# Patient Record
Sex: Male | Born: 1935 | Race: White | Hispanic: No | Marital: Married | Smoking: Former smoker
Health system: Southern US, Community
[De-identification: ages and names within clinical notes are randomized; demographics above are authoritative.]

## PROBLEM LIST (undated history)

## (undated) DIAGNOSIS — C419 Malignant neoplasm of bone and articular cartilage, unspecified: Secondary | ICD-10-CM

## (undated) DIAGNOSIS — H3320 Serous retinal detachment, unspecified eye: Secondary | ICD-10-CM

## (undated) DIAGNOSIS — K219 Gastro-esophageal reflux disease without esophagitis: Secondary | ICD-10-CM

## (undated) DIAGNOSIS — M545 Low back pain: Secondary | ICD-10-CM

## (undated) DIAGNOSIS — M5137 Other intervertebral disc degeneration, lumbosacral region: Secondary | ICD-10-CM

## (undated) DIAGNOSIS — C3411 Malignant neoplasm of upper lobe, right bronchus or lung: Secondary | ICD-10-CM

## (undated) DIAGNOSIS — F32A Depression, unspecified: Secondary | ICD-10-CM

## (undated) DIAGNOSIS — R7301 Impaired fasting glucose: Secondary | ICD-10-CM

## (undated) DIAGNOSIS — C61 Malignant neoplasm of prostate: Secondary | ICD-10-CM

## (undated) DIAGNOSIS — R011 Cardiac murmur, unspecified: Secondary | ICD-10-CM

## (undated) DIAGNOSIS — F419 Anxiety disorder, unspecified: Secondary | ICD-10-CM

## (undated) DIAGNOSIS — M51379 Other intervertebral disc degeneration, lumbosacral region without mention of lumbar back pain or lower extremity pain: Secondary | ICD-10-CM

## (undated) DIAGNOSIS — E039 Hypothyroidism, unspecified: Secondary | ICD-10-CM

## (undated) DIAGNOSIS — K5120 Ulcerative (chronic) proctitis without complications: Secondary | ICD-10-CM

## (undated) DIAGNOSIS — I10 Essential (primary) hypertension: Secondary | ICD-10-CM

## (undated) DIAGNOSIS — E7800 Pure hypercholesterolemia, unspecified: Secondary | ICD-10-CM

## (undated) DIAGNOSIS — K589 Irritable bowel syndrome without diarrhea: Secondary | ICD-10-CM

## (undated) DIAGNOSIS — H409 Unspecified glaucoma: Secondary | ICD-10-CM

## (undated) DIAGNOSIS — F329 Major depressive disorder, single episode, unspecified: Secondary | ICD-10-CM

## (undated) HISTORY — DX: Malignant neoplasm of upper lobe, right bronchus or lung: C34.11

## (undated) HISTORY — DX: Ulcerative (chronic) proctitis without complications: K51.20

## (undated) HISTORY — PX: OTHER SURGICAL HISTORY: SHX169

## (undated) HISTORY — DX: Anxiety disorder, unspecified: F41.9

## (undated) HISTORY — DX: Serous retinal detachment, unspecified eye: H33.20

## (undated) HISTORY — DX: Impaired fasting glucose: R73.01

## (undated) HISTORY — PX: TONSILLECTOMY: SUR1361

## (undated) HISTORY — DX: Irritable bowel syndrome, unspecified: K58.9

## (undated) HISTORY — DX: Low back pain: M54.5

## (undated) HISTORY — PX: PROSTATECTOMY: SHX69

## (undated) HISTORY — PX: COLON SURGERY: SHX602

## (undated) HISTORY — DX: Other intervertebral disc degeneration, lumbosacral region without mention of lumbar back pain or lower extremity pain: M51.379

## (undated) HISTORY — DX: Other intervertebral disc degeneration, lumbosacral region: M51.37

---

## 1998-12-21 ENCOUNTER — Ambulatory Visit (HOSPITAL_COMMUNITY): Admission: RE | Admit: 1998-12-21 | Discharge: 1998-12-21 | Payer: Self-pay | Admitting: Gastroenterology

## 1999-04-18 ENCOUNTER — Encounter: Admission: RE | Admit: 1999-04-18 | Discharge: 1999-04-18 | Payer: Self-pay | Admitting: Gastroenterology

## 1999-04-18 ENCOUNTER — Encounter: Payer: Self-pay | Admitting: Gastroenterology

## 2000-03-10 ENCOUNTER — Ambulatory Visit (HOSPITAL_COMMUNITY): Admission: RE | Admit: 2000-03-10 | Discharge: 2000-03-10 | Payer: Self-pay | Admitting: Gastroenterology

## 2000-07-29 ENCOUNTER — Encounter: Payer: Self-pay | Admitting: General Surgery

## 2000-07-29 ENCOUNTER — Encounter: Admission: RE | Admit: 2000-07-29 | Discharge: 2000-07-29 | Payer: Self-pay | Admitting: General Surgery

## 2000-08-01 ENCOUNTER — Ambulatory Visit (HOSPITAL_BASED_OUTPATIENT_CLINIC_OR_DEPARTMENT_OTHER): Admission: RE | Admit: 2000-08-01 | Discharge: 2000-08-01 | Payer: Self-pay | Admitting: General Surgery

## 2000-08-13 ENCOUNTER — Ambulatory Visit (HOSPITAL_COMMUNITY): Admission: RE | Admit: 2000-08-13 | Discharge: 2000-08-13 | Payer: Self-pay | Admitting: Gastroenterology

## 2002-12-07 ENCOUNTER — Ambulatory Visit: Admission: RE | Admit: 2002-12-07 | Discharge: 2002-12-22 | Payer: Self-pay | Admitting: Radiation Oncology

## 2002-12-29 ENCOUNTER — Encounter: Payer: Self-pay | Admitting: Urology

## 2003-01-03 ENCOUNTER — Inpatient Hospital Stay (HOSPITAL_COMMUNITY): Admission: RE | Admit: 2003-01-03 | Discharge: 2003-01-06 | Payer: Self-pay | Admitting: Urology

## 2004-06-22 ENCOUNTER — Emergency Department (HOSPITAL_COMMUNITY): Admission: EM | Admit: 2004-06-22 | Discharge: 2004-06-22 | Payer: Self-pay | Admitting: Emergency Medicine

## 2004-06-23 ENCOUNTER — Emergency Department (HOSPITAL_COMMUNITY): Admission: EM | Admit: 2004-06-23 | Discharge: 2004-06-23 | Payer: Self-pay | Admitting: Emergency Medicine

## 2004-06-27 ENCOUNTER — Ambulatory Visit (HOSPITAL_COMMUNITY): Admission: RE | Admit: 2004-06-27 | Discharge: 2004-06-27 | Payer: Self-pay | Admitting: Internal Medicine

## 2004-06-28 ENCOUNTER — Ambulatory Visit (HOSPITAL_COMMUNITY): Admission: RE | Admit: 2004-06-28 | Discharge: 2004-06-28 | Payer: Self-pay | Admitting: Cardiology

## 2004-07-24 ENCOUNTER — Ambulatory Visit (HOSPITAL_COMMUNITY): Admission: RE | Admit: 2004-07-24 | Discharge: 2004-07-24 | Payer: Self-pay | Admitting: Internal Medicine

## 2004-08-24 ENCOUNTER — Encounter: Admission: RE | Admit: 2004-08-24 | Discharge: 2004-11-22 | Payer: Self-pay | Admitting: Internal Medicine

## 2004-09-27 ENCOUNTER — Ambulatory Visit (HOSPITAL_COMMUNITY): Admission: RE | Admit: 2004-09-27 | Discharge: 2004-09-27 | Payer: Self-pay | Admitting: Internal Medicine

## 2010-07-01 ENCOUNTER — Encounter: Payer: Self-pay | Admitting: Internal Medicine

## 2010-12-20 ENCOUNTER — Ambulatory Visit (INDEPENDENT_AMBULATORY_CARE_PROVIDER_SITE_OTHER): Payer: 59 | Admitting: Licensed Clinical Social Worker

## 2010-12-20 DIAGNOSIS — F4322 Adjustment disorder with anxiety: Secondary | ICD-10-CM

## 2010-12-27 ENCOUNTER — Ambulatory Visit (INDEPENDENT_AMBULATORY_CARE_PROVIDER_SITE_OTHER): Payer: 59 | Admitting: Licensed Clinical Social Worker

## 2010-12-27 DIAGNOSIS — F4322 Adjustment disorder with anxiety: Secondary | ICD-10-CM

## 2011-01-31 ENCOUNTER — Ambulatory Visit (INDEPENDENT_AMBULATORY_CARE_PROVIDER_SITE_OTHER): Payer: 59 | Admitting: Licensed Clinical Social Worker

## 2011-01-31 DIAGNOSIS — F4323 Adjustment disorder with mixed anxiety and depressed mood: Secondary | ICD-10-CM

## 2011-04-29 ENCOUNTER — Other Ambulatory Visit: Payer: Self-pay | Admitting: Gastroenterology

## 2011-12-17 ENCOUNTER — Emergency Department (HOSPITAL_COMMUNITY): Payer: Medicare Other

## 2011-12-17 ENCOUNTER — Encounter (HOSPITAL_COMMUNITY): Payer: Self-pay | Admitting: Emergency Medicine

## 2011-12-17 ENCOUNTER — Emergency Department (HOSPITAL_COMMUNITY)
Admission: EM | Admit: 2011-12-17 | Discharge: 2011-12-17 | Disposition: A | Discharge status: Home / Self Care | Payer: Medicare Other | Attending: Emergency Medicine | Admitting: Emergency Medicine

## 2011-12-17 DIAGNOSIS — S0180XA Unspecified open wound of other part of head, initial encounter: Secondary | ICD-10-CM | POA: Insufficient documentation

## 2011-12-17 DIAGNOSIS — S0181XA Laceration without foreign body of other part of head, initial encounter: Secondary | ICD-10-CM

## 2011-12-17 DIAGNOSIS — W06XXXA Fall from bed, initial encounter: Secondary | ICD-10-CM | POA: Insufficient documentation

## 2011-12-17 DIAGNOSIS — I10 Essential (primary) hypertension: Secondary | ICD-10-CM | POA: Insufficient documentation

## 2011-12-17 DIAGNOSIS — S0083XA Contusion of other part of head, initial encounter: Secondary | ICD-10-CM | POA: Insufficient documentation

## 2011-12-17 DIAGNOSIS — Z8546 Personal history of malignant neoplasm of prostate: Secondary | ICD-10-CM | POA: Insufficient documentation

## 2011-12-17 DIAGNOSIS — R51 Headache: Secondary | ICD-10-CM | POA: Insufficient documentation

## 2011-12-17 DIAGNOSIS — S0003XA Contusion of scalp, initial encounter: Secondary | ICD-10-CM | POA: Insufficient documentation

## 2011-12-17 DIAGNOSIS — S022XXA Fracture of nasal bones, initial encounter for closed fracture: Secondary | ICD-10-CM | POA: Insufficient documentation

## 2011-12-17 HISTORY — DX: Hypothyroidism, unspecified: E03.9

## 2011-12-17 HISTORY — DX: Unspecified glaucoma: H40.9

## 2011-12-17 HISTORY — DX: Malignant neoplasm of prostate: C61

## 2011-12-17 HISTORY — DX: Major depressive disorder, single episode, unspecified: F32.9

## 2011-12-17 HISTORY — DX: Depression, unspecified: F32.A

## 2011-12-17 HISTORY — DX: Pure hypercholesterolemia, unspecified: E78.00

## 2011-12-17 HISTORY — DX: Essential (primary) hypertension: I10

## 2011-12-17 MED ORDER — HYDROCODONE-ACETAMINOPHEN 5-325 MG PO TABS: 2.0000 | ORAL_TABLET | ORAL | Status: AC | PRN

## 2011-12-17 NOTE — ED Notes (Signed)
Pt states "I have these dreams, sometimes I'm trying to catch fly balls, sometimes I'm going down alleys, sometimes I'm chasing a woman and hit my head on the nightstand, the paramedcis came and told me they think my nose is broken"; pt denies LOC, presents with lacs to nose x 2 and left outer brow, bleeding controlled @ present to all.

## 2011-12-17 NOTE — ED Provider Notes (Signed)
History     CSN: [id number]  Arrival date & time 12/17/11  1216   First MD Initiated Contact with Patient 12/17/11 1308      Chief Complaint  Patient presents with  . Fall  . Epistaxis  . Facial Laceration    (Consider location/radiation/quality/duration/timing/severity/associated sxs/prior treatment) Patient is a 78 y.o. male presenting with fall and nosebleeds. The history is provided by the patient and a relative.  Fall  Epistaxis    patient fell from his bed onto a night table and struck the left side of his face. He was sleeping and had a bad dream. Complains of pain to his left orbit. Denies any eye pain or blurred vision. Did have some epistaxis which has since resolved. No neck pain or weakness in his arms or legs. Did apply bandages prior to arrival was transported here  Past Medical History  Diagnosis Date  . Hypertension   . Depression   . Hypothyroidism   . Hypercholesteremia   . Glaucoma   . Prostate cancer     Past Surgical History  Procedure Date  . Colon surgery   . Prostatectomy   . Tonsillectomy     No family history on file.  History  Substance Use Topics  . Smoking status: Former Games developer  . Smokeless tobacco: Not on file  . Alcohol Use: 0.6 oz/week    1 Cans of beer per week      Review of Systems  HENT: Positive for nosebleeds.   All other systems reviewed and are negative.    Allergies  Sulfa antibiotics  Home Medications  No current outpatient prescriptions on file.  BP 157/100  Pulse 80  Temp 97.7 F (36.5 C) (Oral)  Resp 18  Wt 192 lb (87.091 kg)  SpO2 93%  Physical Exam  Nursing note and vitals reviewed. Constitutional: He is oriented to person, place, and time. He appears well-developed and well-nourished.  Non-toxic appearance. No distress.  HENT:  Head: Normocephalic and atraumatic.  Nose: No nasal septal hematoma.       3 cm laceration at left side of face. 1 cm laceration at bridge of nose  Multiple  facial  contusions.  Eyes: Conjunctivae, EOM and lids are normal. Pupils are equal, round, and reactive to light.  Neck: Normal range of motion. Neck supple. No tracheal deviation present. No mass present.  Cardiovascular: Normal rate, regular rhythm and normal heart sounds.  Exam reveals no gallop.   No murmur heard. Pulmonary/Chest: Effort normal and breath sounds normal. No stridor. No respiratory distress. He has no decreased breath sounds. He has no wheezes. He has no rhonchi. He has no rales.  Abdominal: Soft. Normal appearance and bowel sounds are normal. He exhibits no distension. There is no tenderness. There is no rebound and no CVA tenderness.  Musculoskeletal: Normal range of motion. He exhibits no edema and no tenderness.  Neurological: He is alert and oriented to person, place, and time. He has normal strength. No cranial nerve deficit or sensory deficit. GCS eye subscore is 4. GCS verbal subscore is 5. GCS motor subscore is 6.  Skin: Skin is warm and dry. No abrasion and no rash noted.  Psychiatric: He has a normal mood and affect. His speech is normal and behavior is normal.    ED Course  LACERATION REPAIR Date/Time: 12/17/2011 3:47 PM Performed by: Toy Baker Authorized by: Lorre Nick T Consent: Verbal consent obtained. Risks and benefits: risks, benefits and alternatives were discussed Consent given  by: patient Patient understanding: patient states understanding of the procedure being performed Patient identity confirmed: verbally with patient Time out: Immediately prior to procedure a "time out" was called to verify the correct patient, procedure, equipment, support staff and site/side marked as required. Body area: head/neck Location details: left eyebrow Laceration length: 4 cm Nerve involvement: none Vascular damage: no Anesthesia: local infiltration Local anesthetic: lidocaine 2% without epinephrine Anesthetic total: 10 ml Patient sedated: no Preparation:  Patient was prepped and draped in the usual sterile fashion. Irrigation solution: saline Amount of cleaning: standard Debridement: none Degree of undermining: none Skin closure: 6-0 Prolene Number of sutures: 9 Technique: simple Approximation: close Approximation difficulty: simple Dressing: antibiotic ointment and 4x4 sterile gauze Patient tolerance: Patient tolerated the procedure well with no immediate complications.   (including critical care time)  Labs Reviewed - No data to display No results found.   No diagnosis found.    MDM  Patient's tetanus status is current. Wound has been closed and patient given referral to ENT on call          Toy Baker, MD 12/17/11 [phone]

## 2014-10-24 ENCOUNTER — Emergency Department (HOSPITAL_COMMUNITY)
Admission: EM | Admit: 2014-10-24 | Discharge: 2014-10-24 | Disposition: A | Discharge status: Home / Self Care | Payer: Medicare Other | Attending: Emergency Medicine | Admitting: Emergency Medicine

## 2014-10-24 ENCOUNTER — Encounter (HOSPITAL_COMMUNITY): Payer: Self-pay | Admitting: Emergency Medicine

## 2014-10-24 DIAGNOSIS — Z87891 Personal history of nicotine dependence: Secondary | ICD-10-CM | POA: Insufficient documentation

## 2014-10-24 DIAGNOSIS — E039 Hypothyroidism, unspecified: Secondary | ICD-10-CM | POA: Insufficient documentation

## 2014-10-24 DIAGNOSIS — E780 Pure hypercholesterolemia: Secondary | ICD-10-CM | POA: Insufficient documentation

## 2014-10-24 DIAGNOSIS — R109 Unspecified abdominal pain: Secondary | ICD-10-CM | POA: Diagnosis present

## 2014-10-24 DIAGNOSIS — L02211 Cutaneous abscess of abdominal wall: Secondary | ICD-10-CM

## 2014-10-24 DIAGNOSIS — Z8546 Personal history of malignant neoplasm of prostate: Secondary | ICD-10-CM | POA: Diagnosis not present

## 2014-10-24 DIAGNOSIS — H409 Unspecified glaucoma: Secondary | ICD-10-CM | POA: Diagnosis not present

## 2014-10-24 DIAGNOSIS — I10 Essential (primary) hypertension: Secondary | ICD-10-CM | POA: Diagnosis not present

## 2014-10-24 DIAGNOSIS — Z8659 Personal history of other mental and behavioral disorders: Secondary | ICD-10-CM | POA: Diagnosis not present

## 2014-10-24 DIAGNOSIS — Z79899 Other long term (current) drug therapy: Secondary | ICD-10-CM | POA: Insufficient documentation

## 2014-10-24 LAB — BASIC METABOLIC PANEL
ANION GAP: 11 (ref 5–15)
BUN: 15 mg/dL (ref 6–20)
CALCIUM: 9.3 mg/dL (ref 8.9–10.3)
CO2: 26 mmol/L (ref 22–32)
Chloride: 101 mmol/L (ref 101–111)
Creatinine, Ser: 0.78 mg/dL (ref 0.61–1.24)
GFR calc Af Amer: >60 mL/min (ref >60)
GFR calc non Af Amer: >60 mL/min (ref >60)
GLUCOSE: 128 mg/dL — AB (ref 65–99)
POTASSIUM: 4 mmol/L (ref 3.5–5.1)
SODIUM: 138 mmol/L (ref 135–145)

## 2014-10-24 LAB — CBC WITH DIFFERENTIAL/PLATELET
BASOS PCT: 0 % (ref 0–1)
Basophils Absolute: 0 10*3/uL (ref 0.0–0.1)
EOS ABS: 0.2 10*3/uL (ref 0.0–0.7)
Eosinophils Relative: 2 % (ref 0–5)
HCT: 40.9 % (ref 39.0–52.0)
Hemoglobin: 13.2 g/dL (ref 13.0–17.0)
LYMPHS ABS: 2.2 10*3/uL (ref 0.7–4.0)
Lymphocytes Relative: 20 % (ref 12–46)
MCH: 30.5 pg (ref 26.0–34.0)
MCHC: 32.3 g/dL (ref 30.0–36.0)
MCV: 94.5 fL (ref 78.0–100.0)
MONO ABS: 0.8 10*3/uL (ref 0.1–1.0)
MONOS PCT: 7 % (ref 3–12)
NEUTROS PCT: 71 % (ref 43–77)
Neutro Abs: 7.5 10*3/uL (ref 1.7–7.7)
PLATELETS: 204 10*3/uL (ref 150–400)
RBC: 4.33 MIL/uL (ref 4.22–5.81)
RDW: 13.5 % (ref 11.5–15.5)
WBC: 10.6 10*3/uL — ABNORMAL HIGH (ref 4.0–10.5)

## 2014-10-24 MED ORDER — HYDROCODONE-ACETAMINOPHEN 5-325 MG PO TABS
2.0000 | ORAL_TABLET | ORAL | Status: DC | PRN
Start: 2014-10-24 — End: 2015-02-27

## 2014-10-24 MED ORDER — AMOXICILLIN-POT CLAVULANATE 875-125 MG PO TABS: 1.0000 | ORAL_TABLET | Freq: Two times a day (BID) | ORAL | Status: DC

## 2014-10-24 MED ORDER — CEFAZOLIN SODIUM-DEXTROSE 2-3 GM-% IV SOLR
2.0000 g | Freq: Once | INTRAVENOUS | Status: AC
  Administered 2014-10-24: 2 g via INTRAVENOUS
  Filled 2014-10-24: qty 50

## 2014-10-24 NOTE — ED Notes (Signed)
Labs drawn by Charlann Boxer, RN Iv Antibiotic infusing Warm blanket provided, pt resting comfortably

## 2014-10-24 NOTE — Progress Notes (Addendum)
81 yr old male from [place] abdominal pain, noted to have abdominal area needing further home dressing care CM spoke with pt and his wife to assess for home needs Pt noted during assessment to tell his wife to "be quiet" when she would answer CM questions and pt would need redirection to answer the question. Wife eventually left the room.  CM continued to have difficulty getting direct answers from pt with redirection needed.  Pt is ambulatory No DME needed.  Pt confirms his pcp is [facility] EPIC updated.  Pt states last surgery was 10 yrs ago but the EDP "opened up my old incision today"  Pt informed home health agency would contact him but gave him number to contact them if needed  CM reviewed in details medicare guidelines, home health [facility]) (length of stay in home, types of [facility] staff available, coverage that pays for home health services , primary caregiver, up to 24 hrs before services may be started),CM provided pt with a list of [facility] Pt's  choice of home health agency is [facility] "near [place]" Pt inquired if he could shower.  CM spoke with ED RN about this inquiry 1050 [facility] called and unable to provide pt services (no available staff) Pt updated Pt second choice is [facility]  1055 Cm left voice message for [facility] staff Pending return call  [place] from [place] returned call stating unable to accept referral- first available RN on 10/26/14  1142 CM called pt to discuss with him that [place] would not be able to visit qd also.  [facility] CM spoke with [facility]il [facility] of [facility] to provide referral Pt states " that is ok with me"

## 2014-10-24 NOTE — Discharge Instructions (Signed)
Remove 1-2 inches of gauze from the wound each day. Check with your primary care physician this week. Return here with any worsening symptoms.   Abscess An abscess is an infected area that contains a collection of pus and debris.It can occur in almost any part of the body. An abscess is also known as a furuncle or boil. CAUSES  An abscess occurs when tissue gets infected. This can occur from blockage of oil or sweat glands, infection of hair follicles, or a minor injury to the skin. As the body tries to fight the infection, pus collects in the area and creates pressure under the skin. This pressure causes pain. People with weakened immune systems have difficulty fighting infections and get certain abscesses more often.  SYMPTOMS Usually an abscess develops on the skin and becomes a painful mass that is red, warm, and tender. If the abscess forms under the skin, you may feel a moveable soft area under the skin. Some abscesses break open (rupture) on their own, but most will continue to get worse without care. The infection can spread deeper into the body and eventually into the bloodstream, causing you to feel ill.  DIAGNOSIS  Your caregiver will take your medical history and perform a physical exam. A sample of fluid may also be taken from the abscess to determine what is causing your infection. TREATMENT  Your caregiver may prescribe antibiotic medicines to fight the infection. However, taking antibiotics alone usually does not cure an abscess. Your caregiver may need to make a small cut (incision) in the abscess to drain the pus. In some cases, gauze is packed into the abscess to reduce pain and to continue draining the area. HOME CARE INSTRUCTIONS   Only take over-the-counter or prescription medicines for pain, discomfort, or fever as directed by your caregiver.  If you were prescribed antibiotics, take them as directed. Finish them even if you start to feel better.  If gauze is used, follow  your caregiver's directions for changing the gauze.  To avoid spreading the infection:  Keep your draining abscess covered with a bandage.  Wash your hands well.  Do not share personal care items, towels, or whirlpools with others.  Avoid skin contact with others.  Keep your skin and clothes clean around the abscess.  Keep all follow-up appointments as directed by your caregiver. SEEK MEDICAL CARE IF:   You have increased pain, swelling, redness, fluid drainage, or bleeding.  You have muscle aches, chills, or a general ill feeling.  You have a fever. MAKE SURE YOU:   Understand these instructions.  Will watch your condition.  Will get help right away if you are not doing well or get worse. Document Released: 03/06/2005 Document Revised: 11/26/2011 Document Reviewed: 08/09/2011 [facility] Patient Information 2015 [place], [place]. This information is not intended to replace advice given to you by your health care provider. Make sure you discuss any questions you have with your health care provider.

## 2014-10-24 NOTE — ED Notes (Signed)
MD at bedside. 

## 2014-10-24 NOTE — ED Notes (Addendum)
About two days ago, patient began to have abdominal pain on left side after "picking on it".  Patient had surgery "years ago". Upon assessment, abdomen shows old scar with some redness and tenderness.  Denies drainage.  Describes as painful

## 2014-10-24 NOTE — ED Provider Notes (Signed)
CSN: [id number]     Arrival date & time 10/24/14  0803 History   First MD Initiated Contact with Patient 10/24/14 [phone]     Chief Complaint  Patient presents with  . Abdominal Pain     (Consider location/radiation/quality/duration/timing/severity/associated sxs/prior Treatment) Patient is a 81 y.o. male presenting with abdominal pain.  Abdominal Pain Associated symptoms: no chest pain, no chills, no cough, no diarrhea, no dysuria, no fatigue, no fever, no hematuria, no nausea, no shortness of breath, no sore throat and no vomiting    he states there is an area on his anterior abdominal wall that has been itching since his surgery many years ago.  He picks at it. It become red and swollen over the last 4-5 days and he presents here.  Past Medical History  Diagnosis Date  . Hypertension   . Depression   . Hypothyroidism   . Hypercholesteremia   . Glaucoma   . Prostate cancer    Past Surgical History  Procedure Laterality Date  . Colon surgery    . Prostatectomy    . Tonsillectomy     History reviewed. No pertinent family history. History  Substance Use Topics  . Smoking status: Former Research scientist (life sciences)  . Smokeless tobacco: Not on file  . Alcohol Use: 0.6 oz/week    1 Cans of beer per week    Review of Systems  Constitutional: Negative for fever, chills, diaphoresis, appetite change and fatigue.       No fevers or chills. He states he does not feel ill other than the discomfort in his abdomen.  HENT: Negative for mouth sores, sore throat and trouble swallowing.   Eyes: Negative for visual disturbance.  Respiratory: Negative for cough, chest tightness, shortness of breath and wheezing.   Cardiovascular: Negative for chest pain.  Gastrointestinal: Positive for abdominal pain. Negative for nausea, vomiting, diarrhea and abdominal distention.       Redness swelling and tenderness to his anterior abdominal wall.  Endocrine: Negative for polydipsia, polyphagia and polyuria.    Genitourinary: Negative for dysuria, frequency and hematuria.  Musculoskeletal: Negative for gait problem.  Skin: Negative for color change, pallor and rash.  Neurological: Negative for dizziness, syncope, light-headedness and headaches.  Hematological: Does not bruise/bleed easily.  Psychiatric/Behavioral: Negative for behavioral problems and confusion.      Allergies  Sulfa antibiotics  Home Medications   Prior to Admission medications   Medication Sig Start Date End Date Taking? Authorizing Provider  acetaminophen (TYLENOL) 500 MG tablet Take 1,000 mg by mouth every 12 (twelve) hours.   Yes Historical Provider, MD  APRISO 0.375 G 24 hr capsule Take 8 capsules by mouth every morning. 08/22/14  Yes Historical Provider, MD  atorvastatin (LIPITOR) 20 MG tablet Take 1 tablet by mouth daily. 09/13/14  Yes Historical Provider, MD  dorzolamide (TRUSOPT) 2 % ophthalmic solution Place 1 drop into both eyes 3 (three) times daily. 09/15/14  Yes Historical Provider, MD  levothyroxine (SYNTHROID, LEVOTHROID) 25 MCG tablet Take 1 tablet by mouth daily. 09/05/14  Yes Historical Provider, MD  lisinopril-hydrochlorothiazide (PRINZIDE,ZESTORETIC) 20-12.5 MG per tablet Take 1.5 tablets by mouth daily. 09/19/14  Yes Historical Provider, MD  timolol (TIMOPTIC) 0.5 % ophthalmic solution Place 1 drop into both eyes 3 (three) times daily. 09/15/14  Yes Historical Provider, MD  amoxicillin-clavulanate (AUGMENTIN) 875-125 MG per tablet Take 1 tablet by mouth 2 (two) times daily. 10/24/14   Tanna Furry, MD  HYDROcodone-acetaminophen (NORCO/VICODIN) 5-325 MG per tablet Take 2 tablets by mouth  every 4 (four) hours as needed. 10/24/14   Tanna Furry, MD   BP 116/76 mmHg  Pulse 65  Temp(Src) 98 F (36.7 C) (Oral)  Resp 16  Ht '5\' 8"'$  (1.727 m)  SpO2 92% Physical Exam  Constitutional: He is oriented to person, place, and time. He appears well-developed and well-nourished. No distress.  HENT:  Head: Normocephalic.  Eyes:  Conjunctivae are normal. Pupils are equal, round, and reactive to light. No scleral icterus.  Neck: Normal range of motion. Neck supple. No thyromegaly present.  Cardiovascular: Normal rate and regular rhythm.  Exam reveals no gallop and no friction rub.   No murmur heard. Pulmonary/Chest: Effort normal and breath sounds normal. No respiratory distress. He has no wheezes. He has no rales.  Abdominal: Soft. Bowel sounds are normal. He exhibits no distension. There is no tenderness. There is no rebound.    Musculoskeletal: Normal range of motion.  Neurological: He is alert and oriented to person, place, and time.  Skin: Skin is warm and dry. No rash noted.  Psychiatric: He has a normal mood and affect. His behavior is normal.    ED Course  Procedures (including critical care time) Labs Review Labs Reviewed  CBC WITH DIFFERENTIAL/PLATELET - Abnormal; Notable for the following:    WBC 10.6 (*)    All other components within normal limits  BASIC METABOLIC PANEL - Abnormal; Notable for the following:    Glucose, Bld 128 (*)    All other components within normal limits    Imaging Review No results found.   EKG Interpretation None      MDM   Final diagnoses:  Abdominal wall abscess    INCISION AND DRAINAGE Performed by: Lolita Patella Consent: Verbal consent obtained. Risks and benefits: risks, benefits and alternatives were discussed Type: abscess  Body area: abdominal wall  Anesthesia: local infiltration  Incision was made with a scalpel.  Local anesthetic: lidocaine 2% with epinephrine  Anesthetic total: 5 ml  Complexity: complex Blunt dissection to break up loculations  Drainage: purulent  Drainage amount: Moderate  Packing material: 1/4 in iodoform gauze  Patient tolerance: Patient tolerated the procedure well with no immediate complications.       Tanna Furry, MD 10/24/14 [phone]

## 2014-10-26 NOTE — Progress Notes (Signed)
ED CM received a call from [place], patient coordinator, after speaking with pt who voiced disapproval with not being able to get home health to do his dressing changes daily after they attempted to provide services and to teach wife how to monitor and complete the dressing changes.  Sharee Pimple informed that CM received a call from [facility] staff, Cyril Mourning, on 10/25/14 after pt complained to them and denied need of their services.  Sharee Pimple reported pt was not pleasant when speaking with her as she attempted to find a resolution to his grievance. Cm informed Sharee Pimple pt was argumentative with CM, and wife during ED visit and wife stepped out of room because pt told her to leave at one point.  Cm shared with Sharee Pimple that it is standard for home health agencies to teach the primary care giver how to take care of the pt and the wife was the pt's primary care giver who was willing to assist but the pt was difficult per wife to work with.

## 2015-01-06 ENCOUNTER — Other Ambulatory Visit (HOSPITAL_COMMUNITY): Payer: Self-pay | Admitting: Internal Medicine

## 2015-01-06 DIAGNOSIS — R011 Cardiac murmur, unspecified: Secondary | ICD-10-CM

## 2015-01-09 ENCOUNTER — Other Ambulatory Visit: Payer: Self-pay

## 2015-01-09 ENCOUNTER — Ambulatory Visit (HOSPITAL_COMMUNITY): Payer: Medicare Other | Attending: Cardiovascular Disease

## 2015-01-09 DIAGNOSIS — I358 Other nonrheumatic aortic valve disorders: Secondary | ICD-10-CM | POA: Insufficient documentation

## 2015-01-09 DIAGNOSIS — R011 Cardiac murmur, unspecified: Secondary | ICD-10-CM | POA: Diagnosis not present

## 2015-02-01 ENCOUNTER — Ambulatory Visit
Admission: RE | Admit: 2015-02-01 | Discharge: 2015-02-01 | Disposition: A | Discharge status: Home / Self Care | Payer: Medicare Other | Source: Ambulatory Visit | Attending: Internal Medicine | Admitting: Internal Medicine

## 2015-02-01 ENCOUNTER — Other Ambulatory Visit: Payer: Self-pay | Admitting: Internal Medicine

## 2015-02-01 DIAGNOSIS — R1011 Right upper quadrant pain: Secondary | ICD-10-CM

## 2015-02-01 DIAGNOSIS — K5900 Constipation, unspecified: Secondary | ICD-10-CM

## 2015-02-01 DIAGNOSIS — R9389 Abnormal findings on diagnostic imaging of other specified body structures: Secondary | ICD-10-CM

## 2015-02-06 ENCOUNTER — Ambulatory Visit
Admission: RE | Admit: 2015-02-06 | Discharge: 2015-02-06 | Disposition: A | Discharge status: Home / Self Care | Payer: Medicare Other | Source: Ambulatory Visit | Attending: Internal Medicine | Admitting: Internal Medicine

## 2015-02-06 DIAGNOSIS — R1011 Right upper quadrant pain: Secondary | ICD-10-CM

## 2015-02-06 DIAGNOSIS — R9389 Abnormal findings on diagnostic imaging of other specified body structures: Secondary | ICD-10-CM

## 2015-02-06 DIAGNOSIS — K5900 Constipation, unspecified: Secondary | ICD-10-CM

## 2015-02-06 MED ORDER — IOPAMIDOL (ISOVUE-300) INJECTION 61%
100.0000 mL | Freq: Once | INTRAVENOUS | Status: AC | PRN
  Administered 2015-02-06: 100 mL via INTRAVENOUS

## 2015-02-08 ENCOUNTER — Other Ambulatory Visit: Payer: Self-pay | Admitting: Thoracic Surgery (Cardiothoracic Vascular Surgery)

## 2015-02-08 ENCOUNTER — Other Ambulatory Visit: Payer: Self-pay | Admitting: *Deleted

## 2015-02-08 ENCOUNTER — Encounter: Payer: Self-pay | Admitting: Thoracic Surgery (Cardiothoracic Vascular Surgery)

## 2015-02-08 ENCOUNTER — Institutional Professional Consult (permissible substitution) (INDEPENDENT_AMBULATORY_CARE_PROVIDER_SITE_OTHER): Payer: Medicare Other | Admitting: Thoracic Surgery (Cardiothoracic Vascular Surgery)

## 2015-02-08 ENCOUNTER — Ambulatory Visit
Admission: RE | Admit: 2015-02-08 | Discharge: 2015-02-08 | Disposition: A | Discharge status: Home / Self Care | Payer: Medicare Other | Source: Ambulatory Visit | Attending: Thoracic Surgery (Cardiothoracic Vascular Surgery) | Admitting: Thoracic Surgery (Cardiothoracic Vascular Surgery)

## 2015-02-08 VITALS — BP 133/88 | HR 67 | Resp 20 | Ht 68.0 in | Wt 190.0 lb

## 2015-02-08 DIAGNOSIS — I712 Thoracic aortic aneurysm, without rupture: Secondary | ICD-10-CM | POA: Diagnosis not present

## 2015-02-08 DIAGNOSIS — R918 Other nonspecific abnormal finding of lung field: Secondary | ICD-10-CM

## 2015-02-08 DIAGNOSIS — J948 Other specified pleural conditions: Secondary | ICD-10-CM | POA: Diagnosis not present

## 2015-02-08 DIAGNOSIS — J90 Pleural effusion, not elsewhere classified: Secondary | ICD-10-CM | POA: Insufficient documentation

## 2015-02-08 DIAGNOSIS — I7121 Aneurysm of the ascending aorta, without rupture: Secondary | ICD-10-CM

## 2015-02-08 NOTE — Progress Notes (Signed)
[address]       Sean Cox,Sean Cox [address]             [phone]      PCP is [name], MD Referring Provider is Lavone Orn, MD   HPI: Mr. Sean Cox is a 81 year old man who presents with chief complaint of abdominal pain and shortness of breath when lying on his back.  Mr. Sean Cox is a 81 year old man with a past medical history significant for hypertension, ulcerative proctitis, chronic depression and anxiety, glaucoma, hypothyroidism, prostate cancer, hypercholesterolemia, and a remote history of tobacco abuse (2 packs per day for 30 years, quit in 1983). He recently has been having a lot of difficulty with constipation and abdominal discomfort. This is usually in the right upper quadrant sometimes radiates to the right lower back. Because of his constipation he was not eating well. He noticed some weight loss. He also began experiencing shortness of breath when lying on his back and more recently has noted he gets short of breath with heavier exertion.   He saw Dr. Laurann [place]. An abdominal series and chest x-ray showed a right pleural effusion on the right hilar and perihilar mass.  A CT was done of the chest abdomen and pelvis. It showed a 5.4 x 4.4 cm right upper lobe mass. There was extensive right hilar and mediastinal adenopathy, abdominal retroperitoneal adenopathy, multiple pulmonary nodules in the left lower lobe, a large right pleural effusion, and a new left adrenal mass, all of which were concerning for metastatic disease.  Mr. Sean Cox has lost 12 pounds over the past 3 months, his appetite is poor, he feels fatigued, he has shortness of breath with heavy exertion and orthopnea. He denies unusual cough and hemoptysis. He denies any unusual headaches or visual changes. He has not noted any new bone or joint pain.  Zubrod Score: At the time of surgery this patient's most appropriate activity status/level should be described as: '[]'$     0    Normal activity, no  symptoms '[x]'$     1    Restricted in physical strenuous activity but ambulatory, able to do out light work '[]'$     2    Ambulatory and capable of self care, unable to do work activities, up and about >50 % of waking hours                              '[]'$     3    Only limited self care, in bed greater than 50% of waking hours '[]'$     4    Completely disabled, no self care, confined to bed or chair '[]'$     5    Moribund    Past Medical History  Diagnosis Date  . Hypertension   . Depression   . Hypothyroidism   . Hypercholesteremia   . Glaucoma   . Prostate cancer   . Ulcerative proctitis   . DDD (degenerative disc disease), lumbosacral   . Anxiety   . IBS (irritable bowel syndrome)   . Detached retina   . Impaired fasting glucose     Past Surgical History  Procedure Laterality Date  . Colon surgery    . Prostatectomy      2004- Dr Risa Grill  . Tonsillectomy    . Diverticular stricture removed      1992    Family history  Mother hypertension and heart disease Father heart  disease  Social History Social History  Substance Use Topics  . Smoking status: Former Smoker -- 1.00 packs/day for 30 years    Types: Cigarettes    Quit date: 06/10/1981  . Smokeless tobacco: None  . Alcohol Use: 0.6 oz/week    1 Cans of beer per week    Current Outpatient Prescriptions  Medication Sig Dispense Refill  . acetaminophen (TYLENOL) 500 MG tablet Take 1,000 mg by mouth every 12 (twelve) hours.    . APRISO 0.375 G 24 hr capsule Take 8 capsules by mouth every morning.  11  . atorvastatin (LIPITOR) 20 MG tablet Take 1 tablet by mouth daily.  1  . dorzolamide (TRUSOPT) 2 % ophthalmic solution Place 1 drop into both eyes 3 (three) times daily.  3  . levothyroxine (SYNTHROID, LEVOTHROID) 25 MCG tablet Take 1 tablet by mouth daily.  1  . PARoxetine (PAXIL) 40 MG tablet TK 1 T PO QAM  3  . polyethylene glycol (MIRALAX / GLYCOLAX) packet Take 17 g by mouth daily as needed.    . senna (SENOKOT) 8.6 MG  tablet Take 1 tablet by mouth daily.    . temazepam (RESTORIL) 15 MG capsule Take 15 mg by mouth at bedtime as needed for sleep.    [name] timolol (TIMOPTIC) 0.5 % ophthalmic solution Place 1 drop into both eyes 3 (three) times daily.  3  . tizanidine (ZANAFLEX) 2 MG capsule Take 2 mg by mouth 3 (three) times daily as needed for muscle spasms.    . traMADol (ULTRAM) 50 MG tablet Take by mouth every 6 (six) hours as needed.    [name] HYDROcodone-acetaminophen (NORCO/VICODIN) 5-325 MG per tablet Take 2 tablets by mouth every 4 (four) hours as needed. (Patient not taking: Reported on 02/08/2015) 10 tablet 0  . lisinopril-hydrochlorothiazide (PRINZIDE,ZESTORETIC) 20-12.5 MG per tablet Take 1.5 tablets by mouth daily.  2   No current facility-administered medications for this visit.    Allergies  Allergen Reactions  . Codeine Nausea Only    Nausea   . Imuran [Azathioprine] Other (See Comments)    Abnormal liver functions  . Remicade [Infliximab] Other (See Comments)    Muscle cramps   . Simvastatin Other (See Comments)    Headaches   . Sulfa Antibiotics Other (See Comments)    High fever    Review of Systems  BP 133/88 mmHg  Pulse 67  Resp 20  Ht '5\' 8"'$  (1.727 m)  Wt 190 lb (86.183 kg)  BMI 28.90 kg/m2  SpO2 90% Physical Exam   Diagnostic Tests: CT CHEST, ABDOMEN, AND PELVIS WITH CONTRAST  TECHNIQUE: Multidetector CT imaging of the chest, abdomen and pelvis was performed following the standard protocol during bolus administration of intravenous contrast.  CONTRAST: 132m ISOVUE-300 IOPAMIDOL (ISOVUE-300) INJECTION 61%  COMPARISON: Chest radiograph 02/01/2015 ; CT abdomen pelvis 06/23/2004  FINDINGS: CT CHEST FINDINGS  Mediastinum/Nodes: Multiple enlarged right hilar and mediastinal lymph nodes including a 2.2 cm subcarinal lymph node (image 37; series 3). Enlarged right hilar lymph node measuring 3.0 cm (image 36; series 3). Additional adjacent abnormal right hilar  soft tissue. Prominent right peritracheal lymph node measuring 9 mm (image 30; series 3). Normal heart size. No pericardial effusion. Ascending thoracic aorta measures 4.0 cm. Normal caliber main pulmonary artery. Right hemi thorax mass and effusion narrow the superior vena cava.  Lungs/Pleura: Central airways are patent. There is a 4.4 x 5.4 cm subpleural mass within right upper hemi thorax involving the right upper lobe (image 28;  series 3). Extensive enhancing nodularity involving the right pleural space. Additionally there is an 11 mm enhancing soft tissue mass between anterior right third and fourth ribs (image 35; series 3) and a 1.9 cm enhancing mass along the undersurface of the anterior right fourth rib. Large right pleural effusion. Near complete collapse of right lung.  There is a 1.3 cm nodule within the left lower lobe (image 50; series 7) and a 0.7 cm nodule within the left lower lobe (image 42; series 7). No left-sided pleural effusion.  CT ABDOMEN AND PELVIS FINDINGS  Hepatobiliary: Small calcification within the hepatic parenchyma. No focal hepatic lesions are identified. The gallbladder is unremarkable. No intrahepatic or extrahepatic biliary ductal dilatation.  Pancreas: Unremarkable  Spleen: Unremarkable  Adrenals/Urinary Tract: There is a 1.8 cm indeterminate nodule within the left adrenal gland (image 60; series 3) with an internal density of 60 Hounsfield units. More inferiorly within the left adrenal gland there is a stable 1.1 cm nodule likely representing an adenoma (image 65; series 3). Irregular nodularity involving the right adrenal gland. Kidneys enhance symmetrically with contrast. No hydronephrosis. Urinary bladder is decompressed. No nephroureterolithiasis.  Stomach/Bowel: No abnormal bowel wall thickening or evidence for bowel obstruction. No free fluid or free intraperitoneal air. Oral contrast material demonstrated to the level of  the rectum.  Vascular/Lymphatic: Infrarenal abdominal aortic ectasia measuring 3.2 cm. Peripheral calcified atherosclerotic plaque involving the abdominal aorta. Enlarged right periaortic lymph node measuring 1.4 cm (image 70; series 3). Large left periaortic lymph node (image 87; series 3) measuring 1.6 cm.  Other: None  Musculoskeletal: Multiple anterior right chest wall metastasis described above. Sclerosis involving the right aspect of the T10 vertebral body. Lower thoracic and lumbar spine degenerative changes.  IMPRESSION: Right upper lobe subpleural mass concerning for the possibility of primary pulmonary malignancy. Extensive enhancing nodularity along right pleural space as well as anteriorly involving the right chest wall, extensive right hilar and mediastinal adenopathy, abdominal retroperitoneal adenopathy and multiple pulmonary nodules within the left lower lobe concerning for metastatic disease.  Probable large malignant right pleural effusion with near total collapse of the right lung. There is minimal aeration of the right middle and right upper lobes. Evaluation for additional right pulmonary nodules is limited due to non aeration of the right lung.  Narrowing of superior vena cava. Recommend clinical correlation for the possibility of SVC syndrome.  New left adrenal mass concerning for the possibility of metastatic disease. Additional stable left adrenal nodule likely representing adenoma.  Patchy sclerosis involving the right aspect of the T10 vertebral body potentially degenerative in etiology. Osseous metastatic disease not excluded.  Ascending thoracic aorta measures 4 cm. Recommend semi-annual imaging followup by CTA or MRA and referral to cardiothoracic surgery if not already obtained. This recommendation follows 2010 ACCF/AHA/AATS/ACR/ASA/SCA/SCAI/SIR/STS/SVM Guidelines for the Diagnosis and Management of Patients With Thoracic Aortic  Disease. Circulation. 2010; 121: e266-e36  Infrarenal abdominal aortic ectasia.  These results will be called to the ordering clinician or representative by the Radiologist Assistant, and communication documented in the PACS or zVision Dashboard.   Electronically Signed  By: Lovey Newcomer M.D.  On: 02/06/2015 09:53   Impression: 81 year old man with a remote history of tobacco abuse who has been found to have a large right upper lobe mass with hilar and mediastinal adenopathy, contralateral lung nodules, a large right pleural effusion, and a left adrenal mass. This is highly suspicious for metastatic lung cancer could represent either small cell or non-small cell. He needs additional staging  and a definitive diagnosis to guide therapy.  My opinion the best first option is to do a thoracentesis in the office today. That will drain the pleural effusion which will hopefully help his symptoms. It will also provided fluid for cytology. I discussed the general nature of the procedure with Mr. Baney. He understands the procedure would be done with local anesthesia here in the office using sterile technique. He understands the risks of bleeding and pneumothorax, accepts them and agrees to proceed.  He also needs a PET/CT for staging purposes and to guide additional diagnostic tissue sampling if it is needed.  I had a long discussion with Mr. Crysler and explained these issues to him in detail. He understands the proposed diagnostic pathway.  Plan:  Thoracentesis in office today- fluid to be sent for cell count with differential, cultures, protein, glucose, LDH, pH, and amylase. The bulk of the fluid will be sent for cytology.  PET/CT  I will plan to see Mr. Leibensperger back next week to discuss the results and also schedule him for our multidisciplinary thoracic oncology clinic in the next week or 2.  Procedure  Informed consent obtained.  Using sterile technique, right thoracentesis  performed.  2 L of serous fluid removed.  Patient tolerated the procedure well with minimal coughing at the end of the procedure.  He will be sent for a PA and lateral chest x-ray.  [name], MD [facility] [phone]

## 2015-02-09 LAB — OTHER SOLSTAS TEST

## 2015-02-09 LAB — PH, BODY FLUID: PH, FLUID: 8

## 2015-02-10 ENCOUNTER — Other Ambulatory Visit: Payer: Self-pay | Admitting: Thoracic Surgery (Cardiothoracic Vascular Surgery)

## 2015-02-10 DIAGNOSIS — J90 Pleural effusion, not elsewhere classified: Secondary | ICD-10-CM

## 2015-02-10 LAB — BODY FLUID CELL COUNT WITH DIFFERENTIAL
Eos, Fluid: 0 %
LYMPHS FL: 86 %
Monocyte-Macrophage-Serous Fluid: 12 %
Neutrophil Count, Fluid: 2 % (ref 0–25)
Total Nucleated Cell Count, Fluid: 1800 cu mm — ABNORMAL HIGH (ref 0–1000)

## 2015-02-10 LAB — PROTEIN, PLEURAL FLUID: Protein, Pleural fluid: 5.3 g/dL

## 2015-02-10 LAB — CYTOLOGY,FINE NEEDLE ASPIRATE

## 2015-02-10 LAB — AMYLASE, PLEURAL FLUID: AMYLASE, PLEURAL FLUID: 65 U/L

## 2015-02-10 LAB — LACTATE DEHYDROGENASE, PLEURAL FLUID: LACTATE DEHYDROGENASE, PLEURAL FLUID: 209 U/L

## 2015-02-10 LAB — GLUCOSE, PLEURAL OR PERITONEAL FLUID: GLUCOSE, PLEURAL FLUID: 111 mg/dL

## 2015-02-12 LAB — BODY FLUID CULTURE
GRAM STAIN: NONE SEEN
Organism ID, Bacteria: NO GROWTH

## 2015-02-14 ENCOUNTER — Other Ambulatory Visit: Payer: Self-pay | Admitting: *Deleted

## 2015-02-14 ENCOUNTER — Encounter: Payer: Self-pay | Admitting: Thoracic Surgery (Cardiothoracic Vascular Surgery)

## 2015-02-14 ENCOUNTER — Ambulatory Visit (INDEPENDENT_AMBULATORY_CARE_PROVIDER_SITE_OTHER): Payer: Medicare Other | Admitting: Thoracic Surgery (Cardiothoracic Vascular Surgery)

## 2015-02-14 ENCOUNTER — Ambulatory Visit
Admission: RE | Admit: 2015-02-14 | Discharge: 2015-02-14 | Disposition: A | Discharge status: Home / Self Care | Payer: Medicare Other | Source: Ambulatory Visit | Attending: Thoracic Surgery (Cardiothoracic Vascular Surgery) | Admitting: Thoracic Surgery (Cardiothoracic Vascular Surgery)

## 2015-02-14 VITALS — BP 131/92 | HR 68 | Resp 16 | Ht 68.0 in | Wt 190.0 lb

## 2015-02-14 DIAGNOSIS — Z9889 Other specified postprocedural states: Secondary | ICD-10-CM

## 2015-02-14 DIAGNOSIS — R918 Other nonspecific abnormal finding of lung field: Secondary | ICD-10-CM | POA: Diagnosis not present

## 2015-02-14 DIAGNOSIS — C3411 Malignant neoplasm of upper lobe, right bronchus or lung: Secondary | ICD-10-CM

## 2015-02-14 DIAGNOSIS — C3491 Malignant neoplasm of unspecified part of right bronchus or lung: Secondary | ICD-10-CM

## 2015-02-14 DIAGNOSIS — J948 Other specified pleural conditions: Secondary | ICD-10-CM | POA: Diagnosis not present

## 2015-02-14 DIAGNOSIS — J90 Pleural effusion, not elsewhere classified: Secondary | ICD-10-CM

## 2015-02-14 HISTORY — DX: Malignant neoplasm of upper lobe, right bronchus or lung: C34.11

## 2015-02-14 NOTE — Progress Notes (Signed)
[address]       [place] [address]             [phone]       HPI:  Mr. Mozley returns today to discuss results of his thoracentesis last week.  He is an 81 year old man who presented with a chief complaint of some abdominal pain and orthopnea. CT revealed a right hilar mass with a large right pleural effusion, hilar and mediastinal adenopathy, and a left adrenal mass. I saw him in the office last week and did a thoracentesis draining 2 L of fluid. He probably had about another liter of fluid in the chest postdrainage.  He says that his breathing has gotten a little bit better since the thoracentesis. He is anxious about the results.  Past Medical History  Diagnosis Date  . Hypertension   . Depression   . Hypothyroidism   . Hypercholesteremia   . Glaucoma   . Prostate cancer   . Ulcerative proctitis   . DDD (degenerative disc disease), lumbosacral   . Anxiety   . IBS (irritable bowel syndrome)   . Detached retina   . Impaired fasting glucose       Current Outpatient Prescriptions  Medication Sig Dispense Refill  . acetaminophen (TYLENOL) 500 MG tablet Take 1,000 mg by mouth every 12 (twelve) hours.    . APRISO 0.375 G 24 hr capsule Take 8 capsules by mouth every morning.  11  . atorvastatin (LIPITOR) 20 MG tablet Take 1 tablet by mouth daily.  1  . dorzolamide (TRUSOPT) 2 % ophthalmic solution Place 1 drop into both eyes 3 (three) times daily.  3  . levothyroxine (SYNTHROID, LEVOTHROID) 25 MCG tablet Take 1 tablet by mouth daily.  1  . lisinopril-hydrochlorothiazide (PRINZIDE,ZESTORETIC) 20-12.5 MG per tablet Take 1.5 tablets by mouth daily.  2  . PARoxetine (PAXIL) 40 MG tablet TK 1 T PO QAM  3  . polyethylene glycol (MIRALAX / GLYCOLAX) packet Take 17 g by mouth daily as needed.    . senna (SENOKOT) 8.6 MG tablet Take 1 tablet by mouth daily.    . temazepam (RESTORIL) 15 MG capsule Take 15 mg by mouth at bedtime as needed for sleep.    [name]  timolol (TIMOPTIC) 0.5 % ophthalmic solution Place 1 drop into both eyes 3 (three) times daily.  3  . tizanidine (ZANAFLEX) 2 MG capsule Take 2 mg by mouth 3 (three) times daily as needed for muscle spasms.    . traMADol (ULTRAM) 50 MG tablet Take by mouth every 6 (six) hours as needed.    [name] HYDROcodone-acetaminophen (NORCO/VICODIN) 5-325 MG per tablet Take 2 tablets by mouth every 4 (four) hours as needed. (Patient not taking: Reported on 02/08/2015) 10 tablet 0   No current facility-administered medications for this visit.    Physical Exam BP 131/92 mmHg  Pulse 68  Resp 16  Ht '5\' 8"'$  (1.727 m)  Wt 190 lb (86.183 kg)  BMI 28.90 kg/m2  SpO58 75% 81 year old man in no acute distress Diminished breath sounds right base  Diagnostic Tests: I personally reviewed the chest x-ray. It shows a moderate right pleural effusion unchanged from the post thoracentesis film a week ago. There is a large right hilar mass.  Cytology Positive for malignancy, favor adenocarcinoma.  Impression: Mr. Vanwyk is an 81 year old former smoker who has stage IV non-small cell lung cancer (adenocarcinoma). He needs a PET/CT and MRI of the brain to complete his  staging workup. His PET/CT is scheduled for next Tuesday. He says he may not be able to make it because he has a court case scheduled. I strongly recommended to him that he should not delay having the scan done. I want to get him into our multidisciplinary thoracic oncology clinic to see Dr. Julien Nordmann as quickly as possible. Hopefully we can do that next week.  Plan: PET/CT for staging of newly diagnosed adenocarcinoma  MR brain with contrast newly diagnosed adenocarcinoma rule out brain metastases  Appointment in [place] next week  Mr. Sarr requested that I call Dr. Shonna Chock in [place], [place] [phone]. He is Mr. Redditt nephew. I left a message with him.  [name], MD [facility] [phone]

## 2015-02-16 ENCOUNTER — Other Ambulatory Visit: Payer: Self-pay | Admitting: *Deleted

## 2015-02-16 ENCOUNTER — Ambulatory Visit (INDEPENDENT_AMBULATORY_CARE_PROVIDER_SITE_OTHER): Payer: Medicare Other | Admitting: Thoracic Surgery (Cardiothoracic Vascular Surgery)

## 2015-02-16 ENCOUNTER — Encounter: Payer: Self-pay | Admitting: Thoracic Surgery (Cardiothoracic Vascular Surgery)

## 2015-02-16 ENCOUNTER — Ambulatory Visit
Admission: RE | Admit: 2015-02-16 | Discharge: 2015-02-16 | Disposition: A | Discharge status: Home / Self Care | Payer: Medicare Other | Source: Ambulatory Visit | Attending: Thoracic Surgery (Cardiothoracic Vascular Surgery) | Admitting: Thoracic Surgery (Cardiothoracic Vascular Surgery)

## 2015-02-16 ENCOUNTER — Other Ambulatory Visit: Payer: Self-pay | Admitting: Thoracic Surgery (Cardiothoracic Vascular Surgery)

## 2015-02-16 ENCOUNTER — Encounter (HOSPITAL_COMMUNITY)
Admission: RE | Admit: 2015-02-16 | Discharge: 2015-02-16 | Disposition: A | Discharge status: Home / Self Care | Payer: Medicare Other | Source: Ambulatory Visit | Attending: Thoracic Surgery (Cardiothoracic Vascular Surgery) | Admitting: Thoracic Surgery (Cardiothoracic Vascular Surgery)

## 2015-02-16 ENCOUNTER — Other Ambulatory Visit (HOSPITAL_COMMUNITY)
Admission: RE | Admit: 2015-02-16 | Discharge: 2015-02-16 | Disposition: A | Discharge status: Home / Self Care | Payer: Medicare Other | Source: Ambulatory Visit | Attending: Thoracic Surgery (Cardiothoracic Vascular Surgery) | Admitting: Thoracic Surgery (Cardiothoracic Vascular Surgery)

## 2015-02-16 VITALS — BP 119/83 | HR 86 | Resp 18 | Ht 68.0 in | Wt 190.0 lb

## 2015-02-16 DIAGNOSIS — J90 Pleural effusion, not elsewhere classified: Secondary | ICD-10-CM

## 2015-02-16 DIAGNOSIS — C3491 Malignant neoplasm of unspecified part of right bronchus or lung: Secondary | ICD-10-CM

## 2015-02-16 DIAGNOSIS — J948 Other specified pleural conditions: Secondary | ICD-10-CM

## 2015-02-16 DIAGNOSIS — R918 Other nonspecific abnormal finding of lung field: Secondary | ICD-10-CM | POA: Diagnosis not present

## 2015-02-16 DIAGNOSIS — Z9889 Other specified postprocedural states: Secondary | ICD-10-CM | POA: Diagnosis not present

## 2015-02-16 LAB — GLUCOSE, CAPILLARY: GLUCOSE-CAPILLARY: 116 mg/dL — AB (ref 65–99)

## 2015-02-16 MED ORDER — FLUDEOXYGLUCOSE F - 18 (FDG) INJECTION
9.3200 | Freq: Once | INTRAVENOUS | Status: DC | PRN
  Administered 2015-02-16: 9.32 via INTRAVENOUS
  Filled 2015-02-16: qty 9.32

## 2015-02-16 NOTE — Progress Notes (Addendum)
[address]       [place] [address]             [phone]       HPI:  Mr. Bogan called the office today saying he needed another thoracentesis.   On arrival he has some mild shortness of breath, but his primary complaint is right pleuritic CP along the lower chest.   He had a PET/CT earlier today  Past Medical History  Diagnosis Date  . Hypertension   . Depression   . Hypothyroidism   . Hypercholesteremia   . Glaucoma   . Prostate cancer   . Ulcerative proctitis   . DDD (degenerative disc disease), lumbosacral   . Anxiety   . IBS (irritable bowel syndrome)   . Detached retina   . Impaired fasting glucose       Current Outpatient Prescriptions  Medication Sig Dispense Refill  . acetaminophen (TYLENOL) 500 MG tablet Take 1,000 mg by mouth every 12 (twelve) hours.    . APRISO 0.375 G 24 hr capsule Take 8 capsules by mouth every morning.  11  . atorvastatin (LIPITOR) 20 MG tablet Take 1 tablet by mouth daily.  1  . dorzolamide (TRUSOPT) 2 % ophthalmic solution Place 1 drop into both eyes 3 (three) times daily.  3  . levothyroxine (SYNTHROID, LEVOTHROID) 25 MCG tablet Take 1 tablet by mouth daily.  1  . lisinopril-hydrochlorothiazide (PRINZIDE,ZESTORETIC) 20-12.5 MG per tablet Take 1.5 tablets by mouth daily.  2  . PARoxetine (PAXIL) 40 MG tablet TK 1 T PO QAM  3  . polyethylene glycol (MIRALAX / GLYCOLAX) packet Take 17 g by mouth daily as needed.    . senna (SENOKOT) 8.6 MG tablet Take 1 tablet by mouth daily.    . temazepam (RESTORIL) 15 MG capsule Take 15 mg by mouth at bedtime as needed for sleep.    [name] timolol (TIMOPTIC) 0.5 % ophthalmic solution Place 1 drop into both eyes 3 (three) times daily.  3  . tizanidine (ZANAFLEX) 2 MG capsule Take 2 mg by mouth 3 (three) times daily as needed for muscle spasms.    . traMADol (ULTRAM) 50 MG tablet Take by mouth every 6 (six) hours as needed.    [name] HYDROcodone-acetaminophen (NORCO/VICODIN) 5-325 MG  per tablet Take 2 tablets by mouth every 4 (four) hours as needed. (Patient not taking: Reported on 02/16/2015) 10 tablet 0   No current facility-administered medications for this visit.   Facility-Administered Medications Ordered in Other Visits  Medication Dose Route Frequency Provider Last Rate Last Dose  . fludeoxyglucose F - 18 (FDG) injection 9.32 milli Curie  9.32 milli Curie Intravenous Once PRN Medication Radiologist, MD   9.32 milli Curie at 02/16/15 [id number]    Physical Exam BP 119/83 mmHg  Pulse 86  Resp 18  Ht '5\' 8"'$  (1.727 m)  Wt 190 lb (86.183 kg)  BMI 28.90 kg/m2  SpO2 91% 81 yo man in NAD Alert and oriented Decreased BS right base  Diagnostic Tests: CXR moderate effusion unchanged from 9/6  PET/CT NUCLEAR MEDICINE PET SKULL BASE TO THIGH  TECHNIQUE: 9.3 mCi F-18 FDG was injected intravenously. Full-ring PET imaging was performed from the skull base to thigh after the radiotracer. CT data was obtained and used for attenuation correction and anatomic localization.  FASTING BLOOD GLUCOSE: Value: 116 Mg/dl  COMPARISON: Chest abdomen and pelvic CT of 02/06/2015.  FINDINGS: NECK  No cervical hypermetabolism.  CHEST  Spiculated  right upper lobe lung mass measures 4.6 x 3.6 cm and a S.U.V. max of 13.8 on image 24 of series 6.  Mediastinal and right hilar nodal metastasis. A right low paratracheal node measures a S.U.V. max of 8.5 on image 58 of series 4.  Extensive right-sided pleural hypermetabolism. Example anterior at the site of pleural thickening, measuring a S.U.V. max of 7.4 on image 66 of series 4.  ABDOMEN/PELVIS  Left adrenal hypermetabolic metastasis. 1.6 cm and a S.U.V. max of 9.2, image 93.  Right adrenal hypermetabolism is nonspecific ; minimal nodularity without well-defined mass. This measures a S.U.V. max of 4.2.  SKELETON  Left iliac osseous metastasis. Example at a S.U.V. max of 6.7 on image 140. A I45  hypermetabolic metastasis measures a S.U.V. max of 7.0.  CT IMAGES PERFORMED FOR ATTENUATION CORRECTION  Carotid atherosclerosis. Chest abdomen and pelvic findings deferred to recent diagnostic CTs. Moderate right pleural effusion is similar. Volume loss in the right lower and right middle lobes. Scattered pulmonary nodules which are below the resolution of PET. Non aneurysmal infrarenal aortic dilatation. Prostatectomy.  IMPRESSION: 1. Stage IV primary bronchogenic carcinoma. Right upper lobe lung mass with metastasis to the right pleural space, thoracic nodes, left adrenal gland, and bones. Equivocal right adrenal hypermetabolism with minimal nodularity. 2. Similar moderate right pleural effusion, likely malignant.   Electronically Signed  By: Abigail Miyamoto M.D.  On: 02/16/2015 09:02  I personally reviewed the CXR and PET/CT and concur with the findings as noted above  Impression: 81 yo man with newly diagnosed stage IV lung cancer.  I discussed the PET/CT results with him. He understands he has metastatic cancer.  He has an appointment in [employer] next week to discuss treatment options  He still has a moderate right effusion. Will go ahead with thoracentesis today.  MR brain is scheduled  Plan: Right thoracentesis  MR brain  [place] next week  [name], MD   PROCEDURE  Informed consent obtained Sterile technique used Local anesthesia with 5 ml 1% lidocaine Right thoracentesis performed. 2 L serous fluid obtained. Tolerated well.  Revonda Standard Roxan Hockey, MD [facility] [phone]  [facility] [phone]   ADDENDUM My office received a call from Dr. Martinique- Radiology that post thoracentesis film showed 10-15% pneumothorax. I reveiwed the film and do not see a pneumo. Also looked at it with Dr. Darcey Nora and he does agree with the radiologist's interpretation either. This was a very straight  forward thoracentesis where the fluid was accessed immediately on 1st pass of needle making a pneumo very unlikely. There is a good result in terms of fluid drainage.  Revonda Standard Roxan Hockey, MD [facility] [phone]

## 2015-02-17 ENCOUNTER — Ambulatory Visit
Admission: RE | Admit: 2015-02-17 | Discharge: 2015-02-17 | Disposition: A | Discharge status: Home / Self Care | Payer: Medicare Other | Source: Ambulatory Visit | Attending: Thoracic Surgery (Cardiothoracic Vascular Surgery) | Admitting: Thoracic Surgery (Cardiothoracic Vascular Surgery)

## 2015-02-17 DIAGNOSIS — C3411 Malignant neoplasm of upper lobe, right bronchus or lung: Secondary | ICD-10-CM

## 2015-02-17 MED ORDER — GADOBENATE DIMEGLUMINE 529 MG/ML IV SOLN
18.0000 mL | Freq: Once | INTRAVENOUS | Status: AC | PRN
  Administered 2015-02-17: 18 mL via INTRAVENOUS

## 2015-02-20 ENCOUNTER — Telehealth: Payer: Self-pay | Admitting: *Deleted

## 2015-02-20 ENCOUNTER — Ambulatory Visit (HOSPITAL_COMMUNITY): Payer: Medicare Other

## 2015-02-20 DIAGNOSIS — C3491 Malignant neoplasm of unspecified part of right bronchus or lung: Secondary | ICD-10-CM

## 2015-02-20 NOTE — Telephone Encounter (Signed)
Oncology Nurse Navigator Documentation  Oncology Nurse Navigator Flowsheets 02/20/2015  Navigator Encounter Type Introductory phone call/I received referral on Mr. Hence's for [place].  I called to schedule.  He was upset to wait until 03/02/15.  I re-scheduled him to be seen on 02/22/15 arrive at 11:00.  He was constantly yelling and mad at the appt I asked what else I could do for him and he continued to complain.  He did verbalize understanding of appt time and place.   Patient Visit Type Initial  Treatment Phase Abnormal Scans  Time Spent with Patient 30

## 2015-02-21 ENCOUNTER — Telehealth: Payer: Self-pay | Admitting: Internal Medicine

## 2015-02-21 ENCOUNTER — Other Ambulatory Visit: Payer: Self-pay | Admitting: Medical Oncology

## 2015-02-21 ENCOUNTER — Ambulatory Visit (HOSPITAL_COMMUNITY): Payer: Medicare Other

## 2015-02-21 DIAGNOSIS — C3490 Malignant neoplasm of unspecified part of unspecified bronchus or lung: Secondary | ICD-10-CM

## 2015-02-21 NOTE — Telephone Encounter (Signed)
returned call and s.w. pt wife phone kept cutting off

## 2015-02-22 ENCOUNTER — Telehealth: Payer: Self-pay | Admitting: Internal Medicine

## 2015-02-22 ENCOUNTER — Encounter: Payer: Self-pay | Admitting: Internal Medicine

## 2015-02-22 ENCOUNTER — Ambulatory Visit (HOSPITAL_BASED_OUTPATIENT_CLINIC_OR_DEPARTMENT_OTHER): Payer: Medicare Other | Admitting: Internal Medicine

## 2015-02-22 VITALS — BP 128/73 | HR 87 | Temp 98.6°F | Resp 18 | Ht 68.0 in | Wt 186.8 lb

## 2015-02-22 DIAGNOSIS — C782 Secondary malignant neoplasm of pleura: Secondary | ICD-10-CM

## 2015-02-22 DIAGNOSIS — C3411 Malignant neoplasm of upper lobe, right bronchus or lung: Secondary | ICD-10-CM | POA: Diagnosis not present

## 2015-02-22 DIAGNOSIS — C7972 Secondary malignant neoplasm of left adrenal gland: Secondary | ICD-10-CM

## 2015-02-22 DIAGNOSIS — C7951 Secondary malignant neoplasm of bone: Secondary | ICD-10-CM

## 2015-02-22 DIAGNOSIS — J910 Malignant pleural effusion: Secondary | ICD-10-CM

## 2015-02-22 DIAGNOSIS — C3491 Malignant neoplasm of unspecified part of right bronchus or lung: Secondary | ICD-10-CM

## 2015-02-22 DIAGNOSIS — C772 Secondary and unspecified malignant neoplasm of intra-abdominal lymph nodes: Secondary | ICD-10-CM

## 2015-02-22 NOTE — Progress Notes (Signed)
[place] [phone]   [phone]  CONSULT NOTE  REFERRING PHYSICIAN: Dr. Modesto Charon  REASON FOR CONSULTATION:  81 years old white male recently diagnosed with lung cancer.  HPI Sean Cox is a 81 y.o. male is a very pleasant but interesting 81 years old white male with past medical history significant for hypertension, depression, hypothyroidism, history of prostate cancer status post surgery many years ago, ulcerative colitis, anxiety and glaucoma as well as long history of smoking but quit in 1983. The patient mentions that has been complaining of abdominal pain and constipation for a while. He was seen by his primary care physician Dr. Laurann Cox and chest x-ray was performed in August 2016 and showed a questionable right lung mass and pleural effusion. This was followed by CT scan of the chest, abdomen and pelvis on 02/06/2015 and it showed right upper lobe subpleural mass concerning for possibility of primary pulmonary malignancy and it measured 4.4 x 5.4 cm. Extensive enhancing nodularity along right pleural space as well as anteriorly involving the right chest wall, extensive right hilar and mediastinal adenopathy, abdominal retroperitoneal adenopathy and multiple pulmonary nodules within the left lower lobe concerning for metastatic disease. Probable large malignant right pleural effusion with near total collapse of the right lung. There is minimal aeration of the right middle and right upper lobes. Evaluation for additional right pulmonary nodules is limited due to non aeration of the right lung. Narrowing of superior vena cava. Recommend clinical correlation for the possibility of SVC syndrome. New left adrenal mass concerning for the possibility of metastatic disease. Additional stable left adrenal nodule likely representing adenoma. Patchy sclerosis involving the right aspect of the T10 vertebral body potentially degenerative in etiology.  Osseous metastatic disease not excluded. The patient underwent right-sided thoracentesis twice 1 on 02/08/2015 and the second one on 02/16/2015. The pleural fluid (Accession: [id number]) showed malignant cells consistent with adenocarcinoma. A tissue block was sent to [facility] one for molecular biomarker testing. The patient was referred to Dr. Roxan Cox and a PET scan was performed on 02/16/2015. It showed Stage IV primary bronchogenic carcinoma. Right upper lobe lung mass with metastasis to the right pleural space, thoracic nodes, left adrenal gland, and bones. Equivocal right adrenal hypermetabolism with minimal nodularity. Similar moderate right pleural effusion, likely malignant. MRI of the brain performed on 02/17/2015 showed no evidence for metastatic disease to the brain. Dr. Roxan Cox kindly referred the patient to me today for further evaluation and recommendation regarding treatment of his condition. When seen today the patient continues to complain of pain on the right side of the chest as well as back. Has some shortness of breath with exertion and mild cough but no hemoptysis. He lost around 10 pounds in the last few weeks. He refused to have the discussion of his case in the regular exam room and insisted on having the discussion in my office and he reluctantly agreed to do the physical exam before moving to my office. Family history significant for mother with hypertension and father with heart disease. The patient is married and has 2 children. He was accompanied today by his daughter, Sean Cox. He is a retired Chief Financial Officer. He has a history of smoking 1 pack per day for around 50 years and quit in 1983. He drinks alcohol occasionally and no history of drug abuse.   HPI  Past Medical History  Diagnosis Date  . Hypertension   . Depression   . Hypothyroidism   . Hypercholesteremia   .  Glaucoma   . Prostate cancer   . Ulcerative proctitis   . DDD (degenerative disc disease),  lumbosacral   . Anxiety   . IBS (irritable bowel syndrome)   . Detached retina   . Impaired fasting glucose     Past Surgical History  Procedure Laterality Date  . Colon surgery    . Prostatectomy      2004- Dr Sean Cox  . Tonsillectomy    . Diverticular stricture removed      1992    History reviewed. No pertinent family history.  Social History Social History  Substance Use Topics  . Smoking status: Former Smoker -- 1.00 packs/day for 30 years    Types: Cigarettes    Quit date: 06/10/1981  . Smokeless tobacco: None  . Alcohol Use: 0.6 oz/week    1 Cans of beer per week    Allergies  Allergen Reactions  . Codeine Nausea Only    Nausea   . Imuran [Azathioprine] Other (See Comments)    Abnormal liver functions  . Remicade [Infliximab] Other (See Comments)    Muscle cramps   . Simvastatin Other (See Comments)    Headaches   . Sulfa Antibiotics Other (See Comments)    High fever    Current Outpatient Prescriptions  Medication Sig Dispense Refill  . APRISO 0.375 G 24 hr capsule Take 8 capsules by mouth every morning.  11  . atorvastatin (LIPITOR) 20 MG tablet Take 1 tablet by mouth daily.  1  . dorzolamide (TRUSOPT) 2 % ophthalmic solution Place 1 drop into both eyes 3 (three) times daily.  3  . levothyroxine (SYNTHROID, LEVOTHROID) 25 MCG tablet Take 1 tablet by mouth daily.  1  . PARoxetine (PAXIL) 40 MG tablet TK 1 T PO QAM  3  . temazepam (RESTORIL) 15 MG capsule Take 15 mg by mouth at bedtime as needed for sleep.    [name] timolol (TIMOPTIC) 0.5 % ophthalmic solution Place 1 drop into both eyes 3 (three) times daily.  3  . HYDROcodone-acetaminophen (NORCO/VICODIN) 5-325 MG per tablet Take 2 tablets by mouth every 4 (four) hours as needed. (Patient not taking: Reported on 02/16/2015) 10 tablet 0  . lisinopril-hydrochlorothiazide (PRINZIDE,ZESTORETIC) 20-12.5 MG per tablet Take 1.5 tablets by mouth daily.  2  . senna (SENOKOT) 8.6 MG tablet Take 1 tablet by mouth  daily.    . tizanidine (ZANAFLEX) 2 MG capsule Take 2 mg by mouth 3 (three) times daily as needed for muscle spasms.    . traMADol (ULTRAM) 50 MG tablet Take by mouth every 6 (six) hours as needed.     No current facility-administered medications for this visit.    Review of Systems  Constitutional: positive for fatigue and weight loss Eyes: negative Ears, nose, mouth, throat, and face: negative Respiratory: positive for cough and dyspnea on exertion Cardiovascular: negative Gastrointestinal: negative Genitourinary:negative Integument/breast: negative Hematologic/lymphatic: negative Musculoskeletal:negative Neurological: negative Behavioral/Psych: negative Endocrine: negative Allergic/Immunologic: negative  Physical Exam  JME:QASTM, healthy, no distress, well nourished and well developed SKIN: skin color, texture, turgor are normal, no rashes or significant lesions HEAD: Normocephalic, No masses, lesions, tenderness or abnormalities EYES: normal, PERRLA, Conjunctiva are pink and non-injected EARS: External ears normal, Canals clear OROPHARYNX:no exudate, no erythema and lips, buccal mucosa, and tongue normal  NECK: supple, no adenopathy, no JVD LYMPH:  no palpable lymphadenopathy, no hepatosplenomegaly LUNGS: coarse sounds heard, decreased breath sounds HEART: regular rate & rhythm, no murmurs and no gallops ABDOMEN:abdomen soft, non-tender, normal bowel sounds  and no masses or organomegaly BACK: Back symmetric, no curvature., No CVA tenderness EXTREMITIES:no joint deformities, effusion, or inflammation, no edema, no skin discoloration  NEURO: alert & oriented x 3 with fluent speech, no focal motor/sensory deficits  PERFORMANCE STATUS: ECOG 1  LABORATORY DATA: Lab Results  Component Value Date   WBC 10.6* 10/24/2014   HGB 13.2 10/24/2014   HCT 40.9 10/24/2014   MCV 94.5 10/24/2014   PLT 204 10/24/2014      Chemistry      Component Value Date/Time   NA 138  10/24/2014 0905   K 4.0 10/24/2014 0905   CL 101 10/24/2014 0905   CO2 26 10/24/2014 0905   BUN 15 10/24/2014 0905   CREATININE 0.78 10/24/2014 0905      Component Value Date/Time   CALCIUM 9.3 10/24/2014 0905       RADIOGRAPHIC STUDIES: Dg Chest 2 View  02/16/2015   CLINICAL DATA:  Followup pleural effusion. Right thoracentesis 02/08/2015. Lung mass.  EXAM: CHEST  2 VIEW  COMPARISON:  The chest x-ray 02/14/2015.  FINDINGS: Right upper lobe mass lesion unchanged. Right pleural effusion unchanged. Compressive atelectasis right lower lobe also unchanged.  Left lung is clear. Negative for heart failure or edema. Negative for pneumonia.  IMPRESSION: Right upper lobe mass lesion unchanged.  Right pleural effusion and right lower lobe atelectasis unchanged. Left lung remains clear.   Electronically Signed   By: Franchot Gallo M.D.   On: 02/16/2015 13:45   Dg Chest 2 View  02/14/2015   CLINICAL DATA:  Shortness of breath and hypertension ; pleural effusion  EXAM: CHEST  2 VIEW  COMPARISON:  February 08, 2015 chest radiograph and chest CT February 06, 2015  FINDINGS: There is a moderate pleural effusion on the right with volume loss in the right base. The previously noted mass in the medial right upper hemithorax is again noted measuring 4.3 x 4.1 cm.  Left lung is clear. Heart size and pulmonary vascularity are normal. No apparent adenopathy. There is degenerative change in the thoracic spine.  IMPRESSION: The mass in the medial right upper hemithorax is stable. There is a moderate right effusion with right base volume loss. This effusion is very little changed from most recent chest radiograph. No new opacity. Left lung clear. No change in cardiac silhouette. No pneumothorax.   Electronically Signed   By: Lowella Grip III M.D.   On: 02/14/2015 11:04   Dg Chest 2 View  02/08/2015   CLINICAL DATA:  New diagnosis of lung mass, thoracentesis today  EXAM: CHEST  2 VIEW  COMPARISON:  CT chest of 02/06/2015   FINDINGS: After right thoracentesis there does appear to have been reduction in volume of the right pleural effusion. No pneumothorax is seen. Right basilar atelectasis remains. The pleural-based mass in the medial right upper hemi thorax is again noted. The left lung is clear. Heart size is stable.  IMPRESSION: 1. Decrease in volume of right pleural effusion after right thoracentesis. No pneumothorax. 2. No change in medial right upper hemi thorax mass worrisome for neoplasm.   Electronically Signed   By: Ivar Drape M.D.   On: 02/08/2015 16:55   Ct Chest W Contrast  02/06/2015   CLINICAL DATA:  Patient with recent chest x-ray demonstrating right upper lobe and perihilar masses. Diffuse pain. Shortness of breath. Prior prostatectomy.  EXAM: CT CHEST, ABDOMEN, AND PELVIS WITH CONTRAST  TECHNIQUE: Multidetector CT imaging of the chest, abdomen and pelvis was performed following the  standard protocol during bolus administration of intravenous contrast.  CONTRAST:  111mL ISOVUE-300 IOPAMIDOL (ISOVUE-300) INJECTION 61%  COMPARISON:  Chest radiograph 02/01/2015 ; CT abdomen pelvis 06/23/2004  FINDINGS: CT CHEST FINDINGS  Mediastinum/Nodes: Multiple enlarged right hilar and mediastinal lymph nodes including a 2.2 cm subcarinal lymph node (image 37; series 3). Enlarged right hilar lymph node measuring 3.0 cm (image 36; series 3). Additional adjacent abnormal right hilar soft tissue. Prominent right peritracheal lymph node measuring 9 mm (image 30; series 3). Normal heart size. No pericardial effusion. Ascending thoracic aorta measures 4.0 cm. Normal caliber main pulmonary artery. Right hemi thorax mass and effusion narrow the superior vena cava.  Lungs/Pleura: Central airways are patent. There is a 4.4 x 5.4 cm subpleural mass within right upper hemi thorax involving the right upper lobe (image 28; series 3). Extensive enhancing nodularity involving the right pleural space. Additionally there is an 11 mm enhancing soft  tissue mass between anterior right third and fourth ribs (image 35; series 3) and a 1.9 cm enhancing mass along the undersurface of the anterior right fourth rib. Large right pleural effusion. Near complete collapse of right lung.  There is a 1.3 cm nodule within the left lower lobe (image 50; series 7) and a 0.7 cm nodule within the left lower lobe (image 42; series 7). No left-sided pleural effusion.  CT ABDOMEN AND PELVIS FINDINGS  Hepatobiliary: Small calcification within the hepatic parenchyma. No focal hepatic lesions are identified. The gallbladder is unremarkable. No intrahepatic or extrahepatic biliary ductal dilatation.  Pancreas: Unremarkable  Spleen: Unremarkable  Adrenals/Urinary Tract: There is a 1.8 cm indeterminate nodule within the left adrenal gland (image 60; series 3) with an internal density of 60 Hounsfield units. More inferiorly within the left adrenal gland there is a stable 1.1 cm nodule likely representing an adenoma (image 65; series 3). Irregular nodularity involving the right adrenal gland. Kidneys enhance symmetrically with contrast. No hydronephrosis. Urinary bladder is decompressed. No nephroureterolithiasis.  Stomach/Bowel: No abnormal bowel wall thickening or evidence for bowel obstruction. No free fluid or free intraperitoneal air. Oral contrast material demonstrated to the level of the rectum.  Vascular/Lymphatic: Infrarenal abdominal aortic ectasia measuring 3.2 cm. Peripheral calcified atherosclerotic plaque involving the abdominal aorta. Enlarged right periaortic lymph node measuring 1.4 cm (image 70; series 3). Large left periaortic lymph node (image 87; series 3) measuring 1.6 cm.  Other: None  Musculoskeletal: Multiple anterior right chest wall metastasis described above. Sclerosis involving the right aspect of the T10 vertebral body. Lower thoracic and lumbar spine degenerative changes.  IMPRESSION: Right upper lobe subpleural mass concerning for the possibility of primary  pulmonary malignancy. Extensive enhancing nodularity along right pleural space as well as anteriorly involving the right chest wall, extensive right hilar and mediastinal adenopathy, abdominal retroperitoneal adenopathy and multiple pulmonary nodules within the left lower lobe concerning for metastatic disease.  Probable large malignant right pleural effusion with near total collapse of the right lung. There is minimal aeration of the right middle and right upper lobes. Evaluation for additional right pulmonary nodules is limited due to non aeration of the right lung.  Narrowing of superior vena cava. Recommend clinical correlation for the possibility of SVC syndrome.  New left adrenal mass concerning for the possibility of metastatic disease. Additional stable left adrenal nodule likely representing adenoma.  Patchy sclerosis involving the right aspect of the T10 vertebral body potentially degenerative in etiology. Osseous metastatic disease not excluded.  Ascending thoracic aorta measures 4 cm. Recommend semi-annual imaging  followup by CTA or MRA and referral to cardiothoracic surgery if not already obtained. This recommendation follows 2010 ACCF/AHA/AATS/ACR/ASA/SCA/SCAI/SIR/STS/SVM Guidelines for the Diagnosis and Management of Patients With Thoracic Aortic Disease. Circulation. 2010; 121: e266-e36  Infrarenal abdominal aortic ectasia.  These results will be called to the ordering clinician or representative by the Radiologist Assistant, and communication documented in the PACS or zVision Dashboard.   Electronically Signed   By: Lovey Newcomer M.D.   On: 02/06/2015 09:53   Mr Sean Cox MY Contrast  02/17/2015   CLINICAL DATA:  Lung cancer.  EXAM: MRI HEAD WITHOUT AND WITH CONTRAST  TECHNIQUE: Multiplanar, multiecho pulse sequences of the brain and surrounding structures were obtained without and with intravenous contrast.  CONTRAST:  69mL MULTIHANCE GADOBENATE DIMEGLUMINE 529 MG/ML IV SOLN  COMPARISON:  CT head  without contrast 12/17/2011.  FINDINGS: Mild atrophy and white matter changes are within normal limits for age. White matter changes extend into the brainstem. The basal ganglia are otherwise within normal limits.  No acute infarct, hemorrhage, or mass lesion is present. The ventricles are proportionate to the degree of atrophy. No significant extra-axial fluid collection is present.  Flow is present in the major intracranial arteries. Scleral banding is noted on the right. Bilateral lens replacements are present. Mild mucosal thickening is present along the floor of the right maxillary sinus. The remaining paranasal sinuses and mastoid air cells are clear.  The postcontrast images demonstrate no pathologic enhancement to suggest metastatic disease of the brain or meninges.  Skullbase is within normal limits. Midline structures are unremarkable.  IMPRESSION: 1. No evidence for metastatic disease to the brain. 2. Mild generalized atrophy and white matter disease is within normal limits for age. 3. Postsurgical changes of both globes.   Electronically Signed   By: San Morelle M.D.   On: 02/17/2015 21:54   Ct Abdomen Pelvis W Contrast  02/06/2015   CLINICAL DATA:  Patient with recent chest x-ray demonstrating right upper lobe and perihilar masses. Diffuse pain. Shortness of breath. Prior prostatectomy.  EXAM: CT CHEST, ABDOMEN, AND PELVIS WITH CONTRAST  TECHNIQUE: Multidetector CT imaging of the chest, abdomen and pelvis was performed following the standard protocol during bolus administration of intravenous contrast.  CONTRAST:  139mL ISOVUE-300 IOPAMIDOL (ISOVUE-300) INJECTION 61%  COMPARISON:  Chest radiograph 02/01/2015 ; CT abdomen pelvis 06/23/2004  FINDINGS: CT CHEST FINDINGS  Mediastinum/Nodes: Multiple enlarged right hilar and mediastinal lymph nodes including a 2.2 cm subcarinal lymph node (image 37; series 3). Enlarged right hilar lymph node measuring 3.0 cm (image 36; series 3). Additional  adjacent abnormal right hilar soft tissue. Prominent right peritracheal lymph node measuring 9 mm (image 30; series 3). Normal heart size. No pericardial effusion. Ascending thoracic aorta measures 4.0 cm. Normal caliber main pulmonary artery. Right hemi thorax mass and effusion narrow the superior vena cava.  Lungs/Pleura: Central airways are patent. There is a 4.4 x 5.4 cm subpleural mass within right upper hemi thorax involving the right upper lobe (image 28; series 3). Extensive enhancing nodularity involving the right pleural space. Additionally there is an 11 mm enhancing soft tissue mass between anterior right third and fourth ribs (image 35; series 3) and a 1.9 cm enhancing mass along the undersurface of the anterior right fourth rib. Large right pleural effusion. Near complete collapse of right lung.  There is a 1.3 cm nodule within the left lower lobe (image 50; series 7) and a 0.7 cm nodule within the left lower lobe (image 42; series  7). No left-sided pleural effusion.  CT ABDOMEN AND PELVIS FINDINGS  Hepatobiliary: Small calcification within the hepatic parenchyma. No focal hepatic lesions are identified. The gallbladder is unremarkable. No intrahepatic or extrahepatic biliary ductal dilatation.  Pancreas: Unremarkable  Spleen: Unremarkable  Adrenals/Urinary Tract: There is a 1.8 cm indeterminate nodule within the left adrenal gland (image 60; series 3) with an internal density of 60 Hounsfield units. More inferiorly within the left adrenal gland there is a stable 1.1 cm nodule likely representing an adenoma (image 65; series 3). Irregular nodularity involving the right adrenal gland. Kidneys enhance symmetrically with contrast. No hydronephrosis. Urinary bladder is decompressed. No nephroureterolithiasis.  Stomach/Bowel: No abnormal bowel wall thickening or evidence for bowel obstruction. No free fluid or free intraperitoneal air. Oral contrast material demonstrated to the level of the rectum.   Vascular/Lymphatic: Infrarenal abdominal aortic ectasia measuring 3.2 cm. Peripheral calcified atherosclerotic plaque involving the abdominal aorta. Enlarged right periaortic lymph node measuring 1.4 cm (image 70; series 3). Large left periaortic lymph node (image 87; series 3) measuring 1.6 cm.  Other: None  Musculoskeletal: Multiple anterior right chest wall metastasis described above. Sclerosis involving the right aspect of the T10 vertebral body. Lower thoracic and lumbar spine degenerative changes.  IMPRESSION: Right upper lobe subpleural mass concerning for the possibility of primary pulmonary malignancy. Extensive enhancing nodularity along right pleural space as well as anteriorly involving the right chest wall, extensive right hilar and mediastinal adenopathy, abdominal retroperitoneal adenopathy and multiple pulmonary nodules within the left lower lobe concerning for metastatic disease.  Probable large malignant right pleural effusion with near total collapse of the right lung. There is minimal aeration of the right middle and right upper lobes. Evaluation for additional right pulmonary nodules is limited due to non aeration of the right lung.  Narrowing of superior vena cava. Recommend clinical correlation for the possibility of SVC syndrome.  New left adrenal mass concerning for the possibility of metastatic disease. Additional stable left adrenal nodule likely representing adenoma.  Patchy sclerosis involving the right aspect of the T10 vertebral body potentially degenerative in etiology. Osseous metastatic disease not excluded.  Ascending thoracic aorta measures 4 cm. Recommend semi-annual imaging followup by CTA or MRA and referral to cardiothoracic surgery if not already obtained. This recommendation follows 2010 ACCF/AHA/AATS/ACR/ASA/SCA/SCAI/SIR/STS/SVM Guidelines for the Diagnosis and Management of Patients With Thoracic Aortic Disease. Circulation. 2010; 121: [id number]  Infrarenal abdominal aortic  ectasia.  These results will be called to the ordering clinician or representative by the Radiologist Assistant, and communication documented in the PACS or zVision Dashboard.   Electronically Signed   By: Lovey Newcomer M.D.   On: 02/06/2015 09:53   Nm Pet Image Initial (pi) Skull Base To Thigh  02/16/2015   CLINICAL DATA:  Initial treatment strategy for status post thoracentesis. New lung mass.  EXAM: NUCLEAR MEDICINE PET SKULL BASE TO THIGH  TECHNIQUE: 9.3 mCi F-18 FDG was injected intravenously. Full-ring PET imaging was performed from the skull base to thigh after the radiotracer. CT data was obtained and used for attenuation correction and anatomic localization.  FASTING BLOOD GLUCOSE:  Value:  116 Mg/dl  COMPARISON:  Chest abdomen and pelvic CT of 02/06/2015.  FINDINGS: NECK  No cervical hypermetabolism.  CHEST  Spiculated right upper lobe lung mass measures 4.6 x 3.6 cm and a S.U.V. max of 13.8 on image 24 of series 6.  Mediastinal and right hilar nodal metastasis. A right low paratracheal node measures a S.U.V. max of 8.5 on image  58 of series 4.  Extensive right-sided pleural hypermetabolism. Example anterior at the site of pleural thickening, measuring a S.U.V. max of 7.4 on image 66 of series 4.  ABDOMEN/PELVIS  Left adrenal hypermetabolic metastasis. 1.6 cm and a S.U.V. max of 9.2, image 93.  Right adrenal hypermetabolism is nonspecific ; minimal nodularity without well-defined mass. This measures a S.U.V. max of 4.2.  SKELETON  Left iliac osseous metastasis. Example at a S.U.V. max of 6.7 on image 140. A E45 hypermetabolic metastasis measures a S.U.V. max of 7.0.  CT IMAGES PERFORMED FOR ATTENUATION CORRECTION  Carotid atherosclerosis. Chest abdomen and pelvic findings deferred to recent diagnostic CTs. Moderate right pleural effusion is similar. Volume loss in the right lower and right middle lobes. Scattered pulmonary nodules which are below the resolution of PET. Non aneurysmal infrarenal aortic  dilatation. Prostatectomy.  IMPRESSION: 1. Stage IV primary bronchogenic carcinoma. Right upper lobe lung mass with metastasis to the right pleural space, thoracic nodes, left adrenal gland, and bones. Equivocal right adrenal hypermetabolism with minimal nodularity. 2. Similar moderate right pleural effusion, likely malignant.   Electronically Signed   By: Abigail Miyamoto M.D.   On: 02/16/2015 09:02   Dg Chest 2v Repeat Same Day  02/16/2015   CLINICAL DATA:  Status post right-sided thoracentesis.  EXAM: CHEST - 2 VIEW SAME DAY  COMPARISON:  Chest x-ray of today's date  FINDINGS: The lungs are adequately inflated. The pleural effusion on the right is smaller than the pre procedure study. A faint pleural line is suspected in the upper hemi thorax suspicious for approximately 10-15% pneumothorax. There is no mediastinal shift. The left lung is clear. The heart and pulmonary vascularity are normal. A right suprahilar-paratracheal mass is stable.  IMPRESSION: 1. Probable 10-15% right-sided pneumothorax. A repeat chest x-ray at end expiration is recommended. These results were called by telephone at the time of interpretation on 02/16/2015 at 2:47 pm to Levonne Spiller, RN, who verbally acknowledged these results. 2. Decreased volume of pleural fluid on the right.   Electronically Signed   By: Salar  Martinique M.D.   On: 02/16/2015 14:52   Dg Abd Acute W/chest  02/01/2015   CLINICAL DATA:  Right upper quadrant pain.  History prostate cancer.  EXAM: DG ABDOMEN ACUTE W/ 1V CHEST  COMPARISON:  Chest x-ray 06/27/2004.  CT 06/23/2004.  FINDINGS: Right upper lobe and right perihilar mass lesions versus infiltrates noted. Right lower lobe atelectasis with consolidation noted. Right pleural effusion noted. Cardiomegaly with normal pulmonary vascularity.  Soft tissues of the abdomen unremarkable. Nondilated air-filled small bowel loops are noted. Colon is nondilated. No free air. Surgical sutures are noted pelvis. Calcifications in the  pelvis consistent phleboliths. Diffuse degenerative changes lumbar spine and both hips.  IMPRESSION: 1. Right upper lobe and right perihilar mass and/ or infiltrate. Right lower lobe atelectasis with consolidation. Right pleural effusion. Close follow-up chest x-rays recommended to demonstrate clearing. 2. Cardiomegaly, no pulmonary venous congestion. 3. Air-filled loops of nondistended small bowel noted. Colon is nondistended. No free air. Followup abdominal exam should be considered to exclude developing small bowel distention.   Electronically Signed   By: Marcello Moores  Register   On: 02/01/2015 10:24    ASSESSMENT: This is a very pleasant 81 years old white male recently diagnosed with stage IV (T2b, N2, M1b) non-small cell lung cancer, adenocarcinoma presented with right upper lobe lung mass in addition to pleural based masses, mediastinal lymphadenopathy as well as metastatic disease to the left adrenal, bone and  abdominal lymph nodes diagnosed in August 2016.   PLAN: I had a lengthy and extensive discussion with the patient and his daughter about his current disease stage, prognosis and treatment options. I showed the patient and his daughter the images of the PET scan. I also provided the patient with a handout and diagram about his disease and treatment options. I explained to the patient that we requested the tissue block to be sent to [facility] one for molecular biomarker testings. If the patient has a target mutation-like EGFR mutation or ALK gene translocation, he would be considered for treatment with targeted therapy, otherwise his treatment options would include palliative care and hospice referral versus systemic chemotherapy with carboplatin and Alimta. The patient is currently mildly symptomatic and he would like to wait until the molecular studies are available before starting any treatment. He has recurrent right malignant pleural effusion. I recommended for him to see Dr. Roxan Cox for  consideration of Pleurx catheter placement. So far the patient is interested in consideration of treatment and declined referral to palliative care and hospice at this point. The patient and his daughter has several questions and I answered them completely to their satisfaction. I would see the patient back for follow-up visit in 2 weeks for reevaluation and discussion of his treatment options based on the molecular biomarker studies. He was advised to call immediately if he has any concerning symptoms in the interval. The patient was also seen today by the thoracic navigator.  The patient voices understanding of current disease status and treatment options and is in agreement with the current care plan.  All questions were answered. The patient knows to call the clinic with any problems, questions or concerns. We can certainly see the patient much sooner if necessary.  Thank you so much for allowing me to participate in the care of Sean Cox. I will continue to follow up the patient with you and assist in his care.  I spent 55 minutes counseling the patient face to face. The total time spent in the appointment was 80 minutes.  Disclaimer: This note was dictated with voice recognition software. Similar sounding words can inadvertently be transcribed and may not be corrected upon review.   Journey Ratterman K. February 22, 2015, 9:42 PM

## 2015-02-22 NOTE — Telephone Encounter (Signed)
per pof to sch pt appt-gave pt copy of avs °

## 2015-02-23 ENCOUNTER — Encounter (HOSPITAL_COMMUNITY): Payer: Self-pay

## 2015-02-23 ENCOUNTER — Telehealth: Payer: Self-pay | Admitting: Internal Medicine

## 2015-02-23 ENCOUNTER — Encounter: Payer: Self-pay | Admitting: *Deleted

## 2015-02-23 ENCOUNTER — Other Ambulatory Visit: Payer: Self-pay | Admitting: Internal Medicine

## 2015-02-23 DIAGNOSIS — C3491 Malignant neoplasm of unspecified part of right bronchus or lung: Secondary | ICD-10-CM

## 2015-02-23 NOTE — Telephone Encounter (Signed)
s.w. pt wife and advised on sept appt....ok adn awarae

## 2015-02-23 NOTE — Progress Notes (Signed)
Oncology Nurse Navigator Documentation  Oncology Nurse Navigator Flowsheets 02/23/2015  Navigator Encounter Type Other/I received information today from pathology. There is not enough tissue to be sent for EGFR/ALK.  I spoke with Dr. Julien Nordmann.  He stated he would like patient to come in and get lab work done tomorrow.  Dr. Julien Nordmann ordered labs work and I notified scheduling to schedule.   Patient Visit Type -  Treatment Phase Abnormal Scans  Interventions Coordination of Care  Coordination of Care Other  Time Spent with Patient 15

## 2015-02-24 ENCOUNTER — Ambulatory Visit (HOSPITAL_COMMUNITY): Payer: Medicare Other

## 2015-02-24 ENCOUNTER — Ambulatory Visit (HOSPITAL_BASED_OUTPATIENT_CLINIC_OR_DEPARTMENT_OTHER): Payer: Medicare Other

## 2015-02-24 ENCOUNTER — Encounter: Payer: Self-pay | Admitting: *Deleted

## 2015-02-24 ENCOUNTER — Other Ambulatory Visit: Payer: Self-pay | Admitting: Medical Oncology

## 2015-02-24 ENCOUNTER — Telehealth: Payer: Self-pay | Admitting: Internal Medicine

## 2015-02-24 DIAGNOSIS — C7972 Secondary malignant neoplasm of left adrenal gland: Secondary | ICD-10-CM | POA: Diagnosis not present

## 2015-02-24 DIAGNOSIS — C3411 Malignant neoplasm of upper lobe, right bronchus or lung: Secondary | ICD-10-CM | POA: Diagnosis not present

## 2015-02-24 DIAGNOSIS — C3491 Malignant neoplasm of unspecified part of right bronchus or lung: Secondary | ICD-10-CM

## 2015-02-24 LAB — CBC WITH DIFFERENTIAL/PLATELET
BASO%: 0.4 % (ref 0.0–2.0)
BASOS ABS: 0 10*3/uL (ref 0.0–0.1)
EOS%: 4.1 % (ref 0.0–7.0)
Eosinophils Absolute: 0.3 10*3/uL (ref 0.0–0.5)
HEMATOCRIT: 40 % (ref 38.4–49.9)
HEMOGLOBIN: 12.9 g/dL — AB (ref 13.0–17.1)
LYMPH#: 1 10*3/uL (ref 0.9–3.3)
LYMPH%: 15.7 % (ref 14.0–49.0)
MCH: 29.2 pg (ref 27.2–33.4)
MCHC: 32.3 g/dL (ref 32.0–36.0)
MCV: 90.4 fL (ref 79.3–98.0)
MONO#: 0.6 10*3/uL (ref 0.1–0.9)
MONO%: 10.4 % (ref 0.0–14.0)
NEUT%: 69.4 % (ref 39.0–75.0)
NEUTROS ABS: 4.3 10*3/uL (ref 1.5–6.5)
Platelets: 285 10*3/uL (ref 140–400)
RBC: 4.42 10*6/uL (ref 4.20–5.82)
RDW: 14.5 % (ref 11.0–14.6)
WBC: 6.2 10*3/uL (ref 4.0–10.3)

## 2015-02-24 LAB — COMPREHENSIVE METABOLIC PANEL (CC13)
ALBUMIN: 2.8 g/dL — AB (ref 3.5–5.0)
ALK PHOS: 87 U/L (ref 40–150)
ALT: 8 U/L (ref 0–55)
AST: 11 U/L (ref 5–34)
Anion Gap: 5 mEq/L (ref 3–11)
BUN: 12.6 mg/dL (ref 7.0–26.0)
CALCIUM: 9.2 mg/dL (ref 8.4–10.4)
CO2: 33 mEq/L — ABNORMAL HIGH (ref 22–29)
CREATININE: 0.7 mg/dL (ref 0.7–1.3)
Chloride: 104 mEq/L (ref 98–109)
EGFR: 89 mL/min/{1.73_m2} — ABNORMAL LOW (ref >90)
Glucose: 99 mg/dl (ref 70–140)
Potassium: 4.2 mEq/L (ref 3.5–5.1)
Sodium: 142 mEq/L (ref 136–145)
Total Bilirubin: 0.3 mg/dL (ref 0.20–1.20)
Total Protein: 6.8 g/dL (ref 6.4–8.3)

## 2015-02-24 NOTE — Progress Notes (Signed)
Oncology Nurse Navigator Documentation  Oncology Nurse Navigator Flowsheets 02/24/2015  Navigator Encounter Type Other/I completed [employer] forms and gave to lab  Patient Visit Type -  Treatment Phase -  Interventions -  Coordination of Care -  Time Spent with Patient 15

## 2015-02-24 NOTE — Telephone Encounter (Signed)
Patient called for later lab time today, however patient mentioned having a hard time breathing and was forwarded to triage.

## 2015-02-24 NOTE — Telephone Encounter (Signed)
SPOKE WITH PT. HE IS HAVING SOME SHORTNESS OF BREATH AFTER EXERTION. ALSO RIGHT SIDE BACK PAIN AT A FIVE WHICH IS RELIEVED WITH ALEVE. VERBAL ORDER AND READ BACK TO DR.MOHAMED- PT. NEEDS TO CONTACT DR.HENDRICKSON'S OFFICE CONCERNING PT.'S SHORTNESS OF BREATH. TRIED TO [place] PHONE BUT HAD TO LEAVE THE MESSAGE TO CALL DR.HENDRICKSON'S OFFICE ON PT.'S VOICE MAIL. REQUESTED PT. TO CALL WHEN HE RECEIVED THIS MESSAGE.

## 2015-02-24 NOTE — Telephone Encounter (Signed)
"  I'm calling because I need to talk to the Triage nurse I spoke with earlier.  I guess I won't talk to her today."  Returned call to unidentified caller to assess further.  Reports he "did call [name].  He was in surgery and instructed me to call Dr. Julien Nordmann."  Informed him to call again.  "I also need to know what's causing my back pain.  I've had pain a few weeks now.  I had lab today."  Denies any stress pushing, pulling or injury to affect his back.  Labs will not show any indication of back pain.  "I do not understand why someone could not come out and talk with me about my back and these things.  I'm not an idiot.  I will see the doctor on Tuesday or go to the ED if I have any problems."   Call ended.

## 2015-02-27 ENCOUNTER — Other Ambulatory Visit: Payer: Self-pay | Admitting: *Deleted

## 2015-02-27 ENCOUNTER — Ambulatory Visit (INDEPENDENT_AMBULATORY_CARE_PROVIDER_SITE_OTHER): Payer: Medicare Other | Admitting: Thoracic Surgery (Cardiothoracic Vascular Surgery)

## 2015-02-27 ENCOUNTER — Encounter: Payer: Self-pay | Admitting: Thoracic Surgery (Cardiothoracic Vascular Surgery)

## 2015-02-27 ENCOUNTER — Ambulatory Visit
Admission: RE | Admit: 2015-02-27 | Discharge: 2015-02-27 | Disposition: A | Discharge status: Home / Self Care | Payer: Medicare Other | Source: Ambulatory Visit | Attending: Thoracic Surgery (Cardiothoracic Vascular Surgery) | Admitting: Thoracic Surgery (Cardiothoracic Vascular Surgery)

## 2015-02-27 ENCOUNTER — Other Ambulatory Visit: Payer: Self-pay | Admitting: Thoracic Surgery (Cardiothoracic Vascular Surgery)

## 2015-02-27 VITALS — BP 128/88 | HR 69 | Resp 20 | Ht 68.0 in | Wt 185.0 lb

## 2015-02-27 DIAGNOSIS — C3490 Malignant neoplasm of unspecified part of unspecified bronchus or lung: Secondary | ICD-10-CM

## 2015-02-27 DIAGNOSIS — Z9889 Other specified postprocedural states: Secondary | ICD-10-CM

## 2015-02-27 DIAGNOSIS — J948 Other specified pleural conditions: Secondary | ICD-10-CM

## 2015-02-27 DIAGNOSIS — J90 Pleural effusion, not elsewhere classified: Secondary | ICD-10-CM

## 2015-02-27 DIAGNOSIS — C3491 Malignant neoplasm of unspecified part of right bronchus or lung: Secondary | ICD-10-CM | POA: Diagnosis not present

## 2015-02-27 NOTE — Progress Notes (Signed)
[address]       [place], [address]             [phone]       HPI: Mr. Brach returns for an unscheduled visit due to "fluid coming back."  Mr. Bargar is an 81 yo man with newly diagnosed stage IV adenocarcinoma of the lung.  He has a right pleural effusion. I did a thoracentesis on him on 8/31 and again on 9/8. He was schedule to return tomorrow, but over the past couple of days he has been having more right sided lower chest/ upper abdominal pain and notices he has a hard time taking a deep breath.  He saw Dr. Julien Nordmann last week. He is anxious to begin treatment. They are awaiting the results of his molecular testing before deciding on a course of treatment.  Zubrod Score: At the time of surgery this patient's most appropriate activity status/level should be described as: '[]'$     0    Normal activity, no symptoms '[x]'$     1    Restricted in physical strenuous activity but ambulatory, able to do out light work '[]'$     2    Ambulatory and capable of self care, unable to do work activities, up and about >50 % of waking hours                              '[]'$     3    Only limited self care, in bed greater than 50% of waking hours '[]'$     4    Completely disabled, no self care, confined to bed or chair '[]'$     5    Moribund     Current Outpatient Prescriptions  Medication Sig Dispense Refill  . APRISO 0.375 G 24 hr capsule Take 8 capsules by mouth every morning.  11  . atorvastatin (LIPITOR) 20 MG tablet Take 1 tablet by mouth daily.  1  . dorzolamide (TRUSOPT) 2 % ophthalmic solution Place 1 drop into both eyes 3 (three) times daily.  3  . ibuprofen (ADVIL,MOTRIN) 200 MG tablet Take 200 mg by mouth every 6 (six) hours as needed.    [name] levothyroxine (SYNTHROID, LEVOTHROID) 25 MCG tablet Take 1 tablet by mouth daily.  1  . PARoxetine (PAXIL) 40 MG tablet TK 1 T PO QAM  3  . senna (SENOKOT) 8.6 MG tablet Take 1 tablet by mouth daily.    . temazepam (RESTORIL) 15 MG capsule  Take 15 mg by mouth at bedtime as needed for sleep.    [name] timolol (TIMOPTIC) 0.5 % ophthalmic solution Place 1 drop into both eyes 3 (three) times daily.  3  . tizanidine (ZANAFLEX) 2 MG capsule Take 2 mg by mouth 3 (three) times daily as needed for muscle spasms.    [name] lisinopril-hydrochlorothiazide (PRINZIDE,ZESTORETIC) 20-12.5 MG per tablet Take 1.5 tablets by mouth daily.  2   No current facility-administered medications for this visit.    Physical Exam BP 128/88 mmHg  Pulse 69  Resp 20  Ht '5\' 8"'$  (1.727 m)  Wt 185 lb (83.915 kg)  BMI 28.14 kg/m2  SpO2 90% 81 yo man in NAD Alert and oriented x 3, no focal motor deficit HEENT- unremarkable Diminished BS right base Cardiac RRR, no rub, murmur or gallop Abdomen soft, mildly tender along Right costal margin Extremities, no clubbing, cyanosis or edema   Diagnostic Tests:  CHEST 2 VIEW  COMPARISON: Chest x-ray 02/16/2015. PET-CT 02/16/2015. Chest CT 02/06/2015.  FINDINGS: Persistent upper lobe mass with right hilar fullness is noted. Recurrent prominent right pleural effusion. Left lung is stable. Heart size is stable. Interval resolution of right pneumothorax.  IMPRESSION: 1. Recurrent right pleural effusion, moderate-sized. Interval resolution of right pneumothorax. 2. New right upper lobe mass and right hilar fullness is stable.   Electronically Signed  By: Marcello Moores Register  On: 02/27/2015 11:59  I personally reviewed the chest x-ray and again reviewed his CT and PET/CT. PERRLA findings as noted above.  Impression: 81 year old man with stage IV adenocarcinoma of the right lung. He has a rapidly recurring malignant right pleural effusion. He now again has a symptomatic effusion 10 days after his most recent thoracentesis. I think the best option is placement of a right pleur-x catheter for management of the effusion.  I discussed the proposed operation with him. I informed him of the outpatient nature of the  procedure and the ongoing care of the catheter. We will arrange a HHRN to help with catheter drainage and education. I also provided him with educational material regarding the catheter. I reviewed the indications, risks, benefits and alternatives. He understands the risks include, but are not limited to bleeding, infection, catheter malposition, and catheter occlusion.  He accepts the risks and agrees to proceed  Plan:  Right pleural catheter placement on Wed 03/01/15.  I spent 15 minutes face to face with Mr. Fanfan during this visit. > 50% of time was spent counseling  [name], MD [facility] [phone]

## 2015-02-28 ENCOUNTER — Encounter (HOSPITAL_COMMUNITY): Payer: Self-pay | Admitting: *Deleted

## 2015-02-28 ENCOUNTER — Encounter: Payer: Medicare Other | Admitting: Thoracic Surgery (Cardiothoracic Vascular Surgery)

## 2015-02-28 MED ORDER — DEXTROSE 5 % IV SOLN
1.5000 g | INTRAVENOUS | Status: AC
  Administered 2015-03-01: 1.5 g via INTRAVENOUS
  Filled 2015-02-28: qty 1.5

## 2015-02-28 NOTE — Progress Notes (Signed)
Pt recently diagnosed with a heart murmur by Dr. Lavone Orn. States he's never had any problems. Denies chest pain or sob. States he's no longer on HTN meds.

## 2015-03-01 ENCOUNTER — Encounter (HOSPITAL_COMMUNITY)
Admission: RE | Disposition: A | Discharge status: Home / Self Care | Payer: Self-pay | Source: Ambulatory Visit | Attending: Thoracic Surgery (Cardiothoracic Vascular Surgery)

## 2015-03-01 ENCOUNTER — Encounter (HOSPITAL_COMMUNITY): Payer: Self-pay

## 2015-03-01 ENCOUNTER — Ambulatory Visit (HOSPITAL_COMMUNITY)
Admission: RE | Admit: 2015-03-01 | Discharge: 2015-03-01 | Disposition: A | Discharge status: Home / Self Care | Payer: Medicare Other | Source: Ambulatory Visit | Attending: Thoracic Surgery (Cardiothoracic Vascular Surgery) | Admitting: Thoracic Surgery (Cardiothoracic Vascular Surgery)

## 2015-03-01 ENCOUNTER — Ambulatory Visit (HOSPITAL_COMMUNITY): Payer: Medicare Other | Admitting: Anesthesiology

## 2015-03-01 ENCOUNTER — Ambulatory Visit (HOSPITAL_COMMUNITY): Payer: Medicare Other

## 2015-03-01 DIAGNOSIS — Z87891 Personal history of nicotine dependence: Secondary | ICD-10-CM | POA: Diagnosis not present

## 2015-03-01 DIAGNOSIS — F419 Anxiety disorder, unspecified: Secondary | ICD-10-CM | POA: Insufficient documentation

## 2015-03-01 DIAGNOSIS — J910 Malignant pleural effusion: Secondary | ICD-10-CM | POA: Insufficient documentation

## 2015-03-01 DIAGNOSIS — C3491 Malignant neoplasm of unspecified part of right bronchus or lung: Secondary | ICD-10-CM | POA: Diagnosis present

## 2015-03-01 DIAGNOSIS — Z888 Allergy status to other drugs, medicaments and biological substances status: Secondary | ICD-10-CM | POA: Insufficient documentation

## 2015-03-01 DIAGNOSIS — F329 Major depressive disorder, single episode, unspecified: Secondary | ICD-10-CM | POA: Diagnosis not present

## 2015-03-01 DIAGNOSIS — Z885 Allergy status to narcotic agent status: Secondary | ICD-10-CM | POA: Diagnosis not present

## 2015-03-01 DIAGNOSIS — I10 Essential (primary) hypertension: Secondary | ICD-10-CM | POA: Insufficient documentation

## 2015-03-01 DIAGNOSIS — J90 Pleural effusion, not elsewhere classified: Secondary | ICD-10-CM

## 2015-03-01 DIAGNOSIS — I739 Peripheral vascular disease, unspecified: Secondary | ICD-10-CM | POA: Insufficient documentation

## 2015-03-01 DIAGNOSIS — M1990 Unspecified osteoarthritis, unspecified site: Secondary | ICD-10-CM | POA: Insufficient documentation

## 2015-03-01 DIAGNOSIS — K219 Gastro-esophageal reflux disease without esophagitis: Secondary | ICD-10-CM | POA: Insufficient documentation

## 2015-03-01 DIAGNOSIS — E039 Hypothyroidism, unspecified: Secondary | ICD-10-CM | POA: Diagnosis not present

## 2015-03-01 DIAGNOSIS — Z882 Allergy status to sulfonamides status: Secondary | ICD-10-CM | POA: Insufficient documentation

## 2015-03-01 HISTORY — DX: Cardiac murmur, unspecified: R01.1

## 2015-03-01 HISTORY — PX: CHEST TUBE INSERTION: SHX231

## 2015-03-01 HISTORY — DX: Gastro-esophageal reflux disease without esophagitis: K21.9

## 2015-03-01 LAB — COMPREHENSIVE METABOLIC PANEL
ALK PHOS: 86 U/L (ref 38–126)
ALT: 10 U/L — AB (ref 17–63)
AST: 15 U/L (ref 15–41)
Albumin: 3.1 g/dL — ABNORMAL LOW (ref 3.5–5.0)
Anion gap: 9 (ref 5–15)
BUN: 27 mg/dL — ABNORMAL HIGH (ref 6–20)
CALCIUM: 9.4 mg/dL (ref 8.9–10.3)
CO2: 30 mmol/L (ref 22–32)
CREATININE: 1.03 mg/dL (ref 0.61–1.24)
Chloride: 101 mmol/L (ref 101–111)
GFR calc non Af Amer: >60 mL/min (ref >60)
GLUCOSE: 111 mg/dL — AB (ref 65–99)
Potassium: 4.3 mmol/L (ref 3.5–5.1)
SODIUM: 140 mmol/L (ref 135–145)
Total Bilirubin: 0.3 mg/dL (ref 0.3–1.2)
Total Protein: 6.9 g/dL (ref 6.5–8.1)

## 2015-03-01 LAB — PROTIME-INR
INR: 1.25 (ref 0.00–1.49)
Prothrombin Time: 15.9 seconds — ABNORMAL HIGH (ref 11.6–15.2)

## 2015-03-01 LAB — CBC
HEMATOCRIT: 42 % (ref 39.0–52.0)
HEMOGLOBIN: 13 g/dL (ref 13.0–17.0)
MCH: 29.1 pg (ref 26.0–34.0)
MCHC: 31 g/dL (ref 30.0–36.0)
MCV: 94.2 fL (ref 78.0–100.0)
Platelets: 289 10*3/uL (ref 150–400)
RBC: 4.46 MIL/uL (ref 4.22–5.81)
RDW: 14.4 % (ref 11.5–15.5)
WBC: 8.2 10*3/uL (ref 4.0–10.5)

## 2015-03-01 LAB — APTT: aPTT: 33 seconds (ref 24–37)

## 2015-03-01 SURGERY — INSERTION, PLEURAL DRAINAGE CATHETER
Anesthesia: Monitor Anesthesia Care | Site: Chest | Laterality: Right

## 2015-03-01 MED ORDER — FENTANYL CITRATE (PF) 250 MCG/5ML IJ SOLN
INTRAMUSCULAR | Status: AC
  Filled 2015-03-01: qty 5

## 2015-03-01 MED ORDER — PROPOFOL 10 MG/ML IV BOLUS
INTRAVENOUS | Status: AC
  Filled 2015-03-01: qty 20

## 2015-03-01 MED ORDER — LIDOCAINE 1 % OPTIME INJ - NO CHARGE
INTRAMUSCULAR | Status: DC | PRN
  Administered 2015-03-01: 15 mL

## 2015-03-01 MED ORDER — 0.9 % SODIUM CHLORIDE (POUR BTL) OPTIME
TOPICAL | Status: DC | PRN
  Administered 2015-03-01: 1000 mL

## 2015-03-01 MED ORDER — ONDANSETRON HCL 4 MG/2ML IJ SOLN
INTRAMUSCULAR | Status: DC | PRN
  Administered 2015-03-01: 4 mg via INTRAVENOUS

## 2015-03-01 MED ORDER — SODIUM CHLORIDE 0.9 % IJ SOLN
INTRAMUSCULAR | Status: AC
  Filled 2015-03-01: qty 10

## 2015-03-01 MED ORDER — ACETAMINOPHEN 325 MG PO TABS: 650.0000 mg | ORAL_TABLET | Freq: Four times a day (QID) | ORAL | Status: DC | PRN

## 2015-03-01 MED ORDER — PROPOFOL INFUSION 10 MG/ML OPTIME
INTRAVENOUS | Status: DC | PRN
  Administered 2015-03-01: 40 ug/kg/min via INTRAVENOUS

## 2015-03-01 MED ORDER — TRAMADOL HCL 50 MG PO TABS: 50.0000 mg | ORAL_TABLET | Freq: Four times a day (QID) | ORAL | Status: DC | PRN

## 2015-03-01 MED ORDER — EPHEDRINE SULFATE 50 MG/ML IJ SOLN
INTRAMUSCULAR | Status: AC
  Filled 2015-03-01: qty 1

## 2015-03-01 MED ORDER — PROPOFOL 10 MG/ML IV BOLUS
INTRAVENOUS | Status: DC | PRN
  Administered 2015-03-01 (×2): 20 mg via INTRAVENOUS

## 2015-03-01 MED ORDER — MIDAZOLAM HCL 5 MG/5ML IJ SOLN
INTRAMUSCULAR | Status: DC | PRN
  Administered 2015-03-01: 2 mg via INTRAVENOUS

## 2015-03-01 MED ORDER — MIDAZOLAM HCL 2 MG/2ML IJ SOLN
INTRAMUSCULAR | Status: AC
  Filled 2015-03-01: qty 2

## 2015-03-01 MED ORDER — LACTATED RINGERS IV SOLN
INTRAVENOUS | Status: DC | PRN
  Administered 2015-03-01: 08:00:00 via INTRAVENOUS

## 2015-03-01 MED ORDER — SUCCINYLCHOLINE CHLORIDE 20 MG/ML IJ SOLN
INTRAMUSCULAR | Status: AC
  Filled 2015-03-01: qty 1

## 2015-03-01 MED ORDER — FENTANYL CITRATE (PF) 100 MCG/2ML IJ SOLN
INTRAMUSCULAR | Status: DC | PRN
  Administered 2015-03-01: 50 ug via INTRAVENOUS

## 2015-03-01 SURGICAL SUPPLY — 32 items
ADH SKN CLS APL DERMABOND .7 (GAUZE/BANDAGES/DRESSINGS) ×1
CANISTER SUCTION 2500CC (MISCELLANEOUS) ×2 IMPLANT
COVER SURGICAL LIGHT HANDLE (MISCELLANEOUS) ×2 IMPLANT
DERMABOND ADVANCED (GAUZE/BANDAGES/DRESSINGS) ×1
DERMABOND ADVANCED .7 DNX12 (GAUZE/BANDAGES/DRESSINGS) ×1 IMPLANT
DRAPE C-ARM 42X72 X-RAY (DRAPES) ×2 IMPLANT
DRAPE LAPAROSCOPIC ABDOMINAL (DRAPES) ×2 IMPLANT
GLOVE BIO SURGEON STRL SZ7 (GLOVE) ×1 IMPLANT
GLOVE BIOGEL PI IND STRL 6.5 (GLOVE) IMPLANT
GLOVE BIOGEL PI IND STRL 7.0 (GLOVE) IMPLANT
GLOVE BIOGEL PI INDICATOR 6.5 (GLOVE) ×1
GLOVE BIOGEL PI INDICATOR 7.0 (GLOVE) ×1
GLOVE SURG SIGNA 7.5 PF LTX (GLOVE) ×2 IMPLANT
GLOVE SURG SS PI 7.0 STRL IVOR (GLOVE) ×1 IMPLANT
GOWN STRL REUS W/ TWL LRG LVL3 (GOWN DISPOSABLE) ×1 IMPLANT
GOWN STRL REUS W/ TWL XL LVL3 (GOWN DISPOSABLE) ×1 IMPLANT
GOWN STRL REUS W/TWL LRG LVL3 (GOWN DISPOSABLE) ×2
GOWN STRL REUS W/TWL XL LVL3 (GOWN DISPOSABLE) ×4
KIT BASIN OR (CUSTOM PROCEDURE TRAY) ×2 IMPLANT
KIT PLEURX DRAIN CATH 1000ML (MISCELLANEOUS) ×2 IMPLANT
KIT PLEURX DRAIN CATH 15.5FR (DRAIN) ×2 IMPLANT
KIT ROOM TURNOVER OR (KITS) ×2 IMPLANT
NS IRRIG 1000ML POUR BTL (IV SOLUTION) ×2 IMPLANT
PACK GENERAL/GYN (CUSTOM PROCEDURE TRAY) ×2 IMPLANT
PAD ARMBOARD 7.5X6 YLW CONV (MISCELLANEOUS) ×4 IMPLANT
SET DRAINAGE LINE (MISCELLANEOUS) IMPLANT
SUT ETHILON 3 0 FSL (SUTURE) ×2 IMPLANT
SUT VIC AB 3-0 X1 27 (SUTURE) ×2 IMPLANT
TOWEL OR 17X24 6PK STRL BLUE (TOWEL DISPOSABLE) ×2 IMPLANT
TOWEL OR 17X26 10 PK STRL BLUE (TOWEL DISPOSABLE) ×2 IMPLANT
VALVE REPLACEMENT CAP (MISCELLANEOUS) IMPLANT
WATER STERILE IRR 1000ML POUR (IV SOLUTION) ×2 IMPLANT

## 2015-03-01 NOTE — Anesthesia Preprocedure Evaluation (Addendum)
Anesthesia Evaluation  Patient identified by MRN, date of birth, ID band Patient awake    Reviewed: Allergy & Precautions, NPO status , Patient's Chart, lab work & pertinent test results  History of Anesthesia Complications Negative for: history of anesthetic complications  Airway Mallampati: II  TM Distance: >3 FB Neck ROM: Full    Dental  (+) Poor Dentition, Missing, Chipped, Dental Advisory Given   Pulmonary former smoker,     + decreased breath sounds (absent on R)      Cardiovascular hypertension (no meds), (-) angina+ Peripheral Vascular Disease (asc Aorta 4cm)   Rhythm:Regular Rate:Normal     Neuro/Psych Anxiety Depression TIA   GI/Hepatic Neg liver ROS, GERD  Controlled,  Endo/Other  Hypothyroidism   Renal/GU negative Renal ROS     Musculoskeletal  (+) Arthritis ,   Abdominal   Peds  Hematology negative hematology ROS (+)   Anesthesia Other Findings   Reproductive/Obstetrics                            Anesthesia Physical Anesthesia Plan  ASA: III  Anesthesia Plan: MAC   Post-op Pain Management:    Induction:   Airway Management Planned: Natural Airway and Simple Face Mask  Additional Equipment:   Intra-op Plan:   Post-operative Plan:   Informed Consent: I have reviewed the patients History and Physical, chart, labs and discussed the procedure including the risks, benefits and alternatives for the proposed anesthesia with the patient or authorized representative who has indicated his/her understanding and acceptance.   Dental advisory given  Plan Discussed with: CRNA and Surgeon  Anesthesia Plan Comments: (Plan routine monitors, MAC)        Anesthesia Quick Evaluation

## 2015-03-01 NOTE — Care Management Note (Signed)
Case Management Note  Patient Details  Name: Sean Cox MRN: [id number] Date of Birth: [birth year]  Subjective/Objective:                  81 year old man with a past medical history significant for hypertension, ulcerative proctitis, chronic depression and anxiety, glaucoma, hypothyroidism, prostate cancer, hypercholesterolemia, and a remote history of tobacco abuse (2 packs per day for 30 years, quit in 1983). X-ray shows right pleural effusion on the right hilar and perihilar mass.//Home with spouse.  Action/Plan: Follow for discharge planning.  Expected Discharge Date:     03/01/2015             Expected Discharge Plan:  [place]  In-House Referral:     Discharge planning Services  CM Consult  Post Acute Care Choice:  Home Health Choice offered to:  Spouse  DME Arranged:  Chest tube pluerex DME Agency:  Other - Comment  HH Arranged:  RN [place] Agency:  [place]  Status of Service:  Completed, signed off  Medicare Important Message Given:    Date Medicare IM Given:    Medicare IM give by:    Date Additional Medicare IM Given:    Additional Medicare Important Message give by:     If discussed at [place] of Stay Meetings, dates discussed:    Additional Comments: CM spoke with patient concerning discharge planning. Pt offered choice for [facility] for [facility] services upon discharge. Per pt choice [facility] to provide [facility] services.  [place] rep Christa See contacted concerning new referral. Pt to discharge home with spouse.  DME needs include replacement drainage catheters which pt will take home 1 case a discharge.    Fuller Mandril, RN 03/01/2015, 11:05 AM

## 2015-03-01 NOTE — Interval H&P Note (Signed)
History and Physical Interval Note:  03/01/2015 8:20 AM  Sean Cox  has presented today for surgery, with the diagnosis of MALIGNANT RIGHT PLEURAL EFFUSION  The various methods of treatment have been discussed with the patient and family. After consideration of risks, benefits and other options for treatment, the patient has consented to  Procedure(s): INSERTION PLEURAL DRAINAGE CATHETER (Right) as a surgical intervention .  The patient's history has been reviewed, patient examined, no change in status, stable for surgery.  I have reviewed the patient's chart and labs.  Questions were answered to the patient's satisfaction.     [name]

## 2015-03-01 NOTE — Transfer of Care (Signed)
Immediate Anesthesia Transfer of Care Note  Patient: Sean Cox  Procedure(s) Performed: Procedure(s): INSERTION PLEURAL DRAINAGE CATHETER RIGHT CHEST (Right)  Patient Location: PACU  Anesthesia Type:MAC  Level of Consciousness: awake, alert , oriented and patient cooperative  Airway & Oxygen Therapy: Patient Spontanous Breathing and Patient connected to nasal cannula oxygen  Post-op Assessment: Report given to RN, Post -op Vital signs reviewed and stable and Patient moving all extremities X 4  Post vital signs: Reviewed and stable  Last Vitals:  Filed Vitals:   03/01/15 0738  BP: 147/84  Pulse: 75  Temp: 36.4 C  Resp: 20    Complications: No apparent anesthesia complications

## 2015-03-01 NOTE — Anesthesia Postprocedure Evaluation (Signed)
  Anesthesia Post-op Note  Patient: Sean Cox  Procedure(s) Performed: Procedure(s): INSERTION PLEURAL DRAINAGE CATHETER RIGHT CHEST (Right)  Patient Location: PACU  Anesthesia Type:MAC  Level of Consciousness: awake, alert , oriented and patient cooperative  Airway and Oxygen Therapy: Patient Spontanous Breathing  Post-op Pain: none  Post-op Assessment: Post-op Vital signs reviewed, Patient's Cardiovascular Status Stable, Respiratory Function Stable, Patent Airway, No signs of Nausea or vomiting and Pain level controlled              Post-op Vital Signs: Reviewed and stable  Last Vitals:  Filed Vitals:   03/01/15 1131  BP:   Pulse: 64  Temp:   Resp:     Complications: No apparent anesthesia complications

## 2015-03-01 NOTE — Brief Op Note (Signed)
03/01/2015  9:19 AM  PATIENT:  Sean Cox  81 y.o. male  PRE-OPERATIVE DIAGNOSIS:  MALIGNANT RIGHT PLEURAL EFFUSION  POST-OPERATIVE DIAGNOSIS:  MALIGNANT RIGHT PLEURAL EFFUSION  PROCEDURE:  Procedure(s): INSERTION PLEURAL DRAINAGE CATHETER RIGHT CHEST (Right)  SURGEON:  Surgeon(s) and Role:    * [name], MD - Primary  ASSISTANTS: none   ANESTHESIA:   local and MAC  EBL:  Total I/O In: -  Out: 2010 [Other:2000; Blood:10]  BLOOD ADMINISTERED:none  DRAINS: right pleural catheter   LOCAL MEDICATIONS USED:  LIDOCAINE  and Amount: 15 ml  SPECIMEN:  Source of Specimen:  right pleural fluid  DISPOSITION OF SPECIMEN:  N/A  COUNTS:  YES  PLAN OF CARE: Discharge to home after PACU  PATIENT DISPOSITION:  PACU - hemodynamically stable.   Delay start of Pharmacological VTE agent (>24hrs) due to surgical blood loss or risk of bleeding: not applicable  2L of serous fluid evacuated

## 2015-03-01 NOTE — H&P (View-Only) (Signed)
[address]       [place] [address]             [phone]       HPI: Mr. Morawski returns for an unscheduled visit due to "fluid coming back."  Mr. Slivinski is an 81 yo man with newly diagnosed stage IV adenocarcinoma of the lung.  He has a right pleural effusion. I did a thoracentesis on him on 8/31 and again on 9/8. He was schedule to return tomorrow, but over the past couple of days he has been having more right sided lower chest/ upper abdominal pain and notices he has a hard time taking a deep breath.  He saw Dr. Julien Nordmann last week. He is anxious to begin treatment. They are awaiting the results of his molecular testing before deciding on a course of treatment.  Zubrod Score: At the time of surgery this patient's most appropriate activity status/level should be described as: '[]'$     0    Normal activity, no symptoms '[x]'$     1    Restricted in physical strenuous activity but ambulatory, able to do out light work '[]'$     2    Ambulatory and capable of self care, unable to do work activities, up and about >50 % of waking hours                              '[]'$     3    Only limited self care, in bed greater than 50% of waking hours '[]'$     4    Completely disabled, no self care, confined to bed or chair '[]'$     5    Moribund     Current Outpatient Prescriptions  Medication Sig Dispense Refill  . APRISO 0.375 G 24 hr capsule Take 8 capsules by mouth every morning.  11  . atorvastatin (LIPITOR) 20 MG tablet Take 1 tablet by mouth daily.  1  . dorzolamide (TRUSOPT) 2 % ophthalmic solution Place 1 drop into both eyes 3 (three) times daily.  3  . ibuprofen (ADVIL,MOTRIN) 200 MG tablet Take 200 mg by mouth every 6 (six) hours as needed.    [name] levothyroxine (SYNTHROID, LEVOTHROID) 25 MCG tablet Take 1 tablet by mouth daily.  1  . PARoxetine (PAXIL) 40 MG tablet TK 1 T PO QAM  3  . senna (SENOKOT) 8.6 MG tablet Take 1 tablet by mouth daily.    . temazepam (RESTORIL) 15 MG capsule  Take 15 mg by mouth at bedtime as needed for sleep.    [name] timolol (TIMOPTIC) 0.5 % ophthalmic solution Place 1 drop into both eyes 3 (three) times daily.  3  . tizanidine (ZANAFLEX) 2 MG capsule Take 2 mg by mouth 3 (three) times daily as needed for muscle spasms.    [name] lisinopril-hydrochlorothiazide (PRINZIDE,ZESTORETIC) 20-12.5 MG per tablet Take 1.5 tablets by mouth daily.  2   No current facility-administered medications for this visit.    Physical Exam BP 128/88 mmHg  Pulse 69  Resp 20  Ht '5\' 8"'$  (1.727 m)  Wt 185 lb (83.915 kg)  BMI 28.14 kg/m2  SpO2 90% 81 yo man in NAD Alert and oriented x 3, no focal motor deficit HEENT- unremarkable Diminished BS right base Cardiac RRR, no rub, murmur or gallop Abdomen soft, mildly tender along Right costal margin Extremities, no clubbing, cyanosis or edema   Diagnostic Tests:  CHEST 2 VIEW  COMPARISON: Chest x-ray 02/16/2015. PET-CT 02/16/2015. Chest CT 02/06/2015.  FINDINGS: Persistent upper lobe mass with right hilar fullness is noted. Recurrent prominent right pleural effusion. Left lung is stable. Heart size is stable. Interval resolution of right pneumothorax.  IMPRESSION: 1. Recurrent right pleural effusion, moderate-sized. Interval resolution of right pneumothorax. 2. New right upper lobe mass and right hilar fullness is stable.   Electronically Signed  By: Marcello Moores Register  On: 02/27/2015 11:59  I personally reviewed the chest x-ray and again reviewed his CT and PET/CT. PERRLA findings as noted above.  Impression: 81 year old man with stage IV adenocarcinoma of the right lung. He has a rapidly recurring malignant right pleural effusion. He now again has a symptomatic effusion 10 days after his most recent thoracentesis. I think the best option is placement of a right pleur-x catheter for management of the effusion.  I discussed the proposed operation with him. I informed him of the outpatient nature of the  procedure and the ongoing care of the catheter. We will arrange a HHRN to help with catheter drainage and education. I also provided him with educational material regarding the catheter. I reviewed the indications, risks, benefits and alternatives. He understands the risks include, but are not limited to bleeding, infection, catheter malposition, and catheter occlusion.  He accepts the risks and agrees to proceed  Plan:  Right pleural catheter placement on Wed 03/01/15.  I spent 15 minutes face to face with Mr. Seminara during this visit. > 50% of time was spent counseling  [name], MD [facility] [phone]

## 2015-03-01 NOTE — Discharge Instructions (Signed)
Do not drive or engage in heavy physical activity for 24 hours  You may resume normal activities tomorrow  You may shower tomorrow. Leave dressing in place when taking a shower  You have a prescription for a pain medication, tramadol. You may use as directed. Do NOT exceed the prescribed dosage  You may use tylenol or ibuprofen in addition to, or instead of, the tramadol  Call [phone] if you develop fever > 101F, chest pain, shortness of breath, or notice redness or drainage around the catheter  My office will contact you with follow up information  A home health nurse has been arranged to assist with catheter care and drainages

## 2015-03-01 NOTE — Op Note (Signed)
NAMEKASEEM, VASTINE NO.:  [id number]  MEDICAL RECORD NO.:  [id number]  LOCATION:  [facility]                         FACILITY:  [facility]  PHYSICIAN:  Revonda Standard. Roxan Hockey, M.D.DATE OF BIRTH:  Feb 04, 1933  DATE OF PROCEDURE:  03/01/2015 DATE OF DISCHARGE:                              OPERATIVE REPORT   PREOPERATIVE DIAGNOSIS:  Malignant right pleural effusion.  POSTOPERATIVE DIAGNOSIS:  Malignant right pleural effusion.  PROCEDURE:  Insertion of pleural drainage catheter, right chest.  SURGEON:  Revonda Standard. Roxan Hockey, M.D.  ASSISTANT:  None.  ANESTHESIA:  Local with intravenous sedation monitored by Anesthesia.  FINDINGS:  2 L of serous fluid evacuated.  Fluoroscopy confirmed position of the catheter within the chest.  EBL:  Minimal.  CLINICAL NOTE:  Mr. Monger is an 81 year old man who was recently diagnosed with stage IV lung cancer.  He has a malignant right pleural effusion.  He has had thoracentesis twice over the past couple of weeks and had a rapid recurrence of the fluid.  He was advised to have a pleural catheter placed for management of the right pleural effusion. The indications, risks, benefits, and alternatives were discussed in detail with the patient.  He understood and accepted the risks and agreed to proceed.  He did understand that he would have an indwelling catheter that would require care long-term.  OPERATIVE NOTE:  Mr. Walck was brought to the operating room on March 01, 2015.  He was given intravenous antibiotics.  He was given sedation and monitored by the Anesthesia Service.  He was placed in a supine position with the right chest slightly elevated.  The right chest was prepped and draped in the usual sterile fashion.  The sites for the entry and exit sites and the subcutaneous tissue for the tunnel were anesthetized with 1% lidocaine.  1% lidocaine was also used to anesthetize the site for the needle tract into the chest. 15 ml  of local was used. A finder needle was passed into the pleural space.  An incision was made at the entry site posteriorly.  The right pleural space was accessed using the Seldinger technique.  The position of the wire within the pleural space was confirmed with fluoroscopy.  An incision then was made at the exit site. The catheter was tunneled from the exit site to the entry site with the cuff just below the skin at the exit site.  The tract over the wire then was dilated and the peel-away sheath introducer was placed. The inner cannula was removed, and the catheter was advanced through the peel-away sheath introducer, which was then removed.  The catheter was secured at the exit site with a 3-0 nylon suture.  The catheter was placed to suction and 2 L of straw-colored fluid was evacuated.  Fluoroscopy once again confirmed position of the catheter and also confirmed a decrease in the size of the effusion.  The skin at the entry site was closed with a running 3-0 Vicryl subcuticular suture. Dermabond was applied to the incision.  After completing the drainage, the catheter was capped and dressed.  The patient then was taken from the operating room to the postanesthetic care  unit in good condition.     Revonda Standard Roxan Hockey, M.D.     SCH/MEDQ  D:  03/01/2015  T:  03/01/2015  Job:  [id number]

## 2015-03-01 NOTE — OR Nursing (Signed)
Dr. Glennon Mac notified that patient has room air 02 sats 88-89%. Informed RN to have patient go to phase 2, get dressed and sit upright in recliner and check RA sats again. Goal is 90% prior to d/c home.

## 2015-03-02 ENCOUNTER — Encounter (HOSPITAL_COMMUNITY): Payer: Self-pay | Admitting: Thoracic Surgery (Cardiothoracic Vascular Surgery)

## 2015-03-02 ENCOUNTER — Telehealth: Payer: Self-pay | Admitting: *Deleted

## 2015-03-02 NOTE — Telephone Encounter (Signed)
Oncology Nurse Navigator Documentation  Oncology Nurse Navigator Flowsheets 03/02/2015  Navigator Encounter Type Telephone/Patient called and left me a vm message to call.  I called back and we spoke.  He is frustrated about not knowing what is going on.  He feels like there is a delay in care.  I spoke to him about the test results take a while to come back.  I listened to him as he spoke about his frustrations.  I empathized with him and stated it takes some time.  He was thankful for the call.   Patient Visit Type -  Treatment Phase Abnormal Scans  Barriers/Navigation Needs Education  Interventions Education Method  Coordination of Care -  Education Method Verbal  Specialty Items/DME Pleur X catheter  Time Spent with Patient 30

## 2015-03-07 ENCOUNTER — Ambulatory Visit (HOSPITAL_BASED_OUTPATIENT_CLINIC_OR_DEPARTMENT_OTHER): Payer: Medicare Other | Admitting: Internal Medicine

## 2015-03-07 ENCOUNTER — Other Ambulatory Visit (HOSPITAL_BASED_OUTPATIENT_CLINIC_OR_DEPARTMENT_OTHER): Payer: Medicare Other

## 2015-03-07 ENCOUNTER — Encounter: Payer: Self-pay | Admitting: Internal Medicine

## 2015-03-07 ENCOUNTER — Telehealth: Payer: Self-pay | Admitting: Internal Medicine

## 2015-03-07 VITALS — BP 113/63 | HR 93 | Temp 98.1°F | Resp 17 | Ht 68.0 in | Wt 187.2 lb

## 2015-03-07 DIAGNOSIS — C3491 Malignant neoplasm of unspecified part of right bronchus or lung: Secondary | ICD-10-CM

## 2015-03-07 DIAGNOSIS — J90 Pleural effusion, not elsewhere classified: Secondary | ICD-10-CM

## 2015-03-07 DIAGNOSIS — C7972 Secondary malignant neoplasm of left adrenal gland: Secondary | ICD-10-CM | POA: Diagnosis not present

## 2015-03-07 DIAGNOSIS — C7951 Secondary malignant neoplasm of bone: Secondary | ICD-10-CM

## 2015-03-07 DIAGNOSIS — C772 Secondary and unspecified malignant neoplasm of intra-abdominal lymph nodes: Secondary | ICD-10-CM

## 2015-03-07 DIAGNOSIS — C3411 Malignant neoplasm of upper lobe, right bronchus or lung: Secondary | ICD-10-CM

## 2015-03-07 LAB — CBC WITH DIFFERENTIAL/PLATELET
BASO%: 0.6 % (ref 0.0–2.0)
BASOS ABS: 0.1 10*3/uL (ref 0.0–0.1)
EOS ABS: 0.3 10*3/uL (ref 0.0–0.5)
EOS%: 3.2 % (ref 0.0–7.0)
HCT: 39.4 % (ref 38.4–49.9)
HEMOGLOBIN: 12.6 g/dL — AB (ref 13.0–17.1)
LYMPH%: 14.2 % (ref 14.0–49.0)
MCH: 29 pg (ref 27.2–33.4)
MCHC: 32 g/dL (ref 32.0–36.0)
MCV: 90.7 fL (ref 79.3–98.0)
MONO#: 0.7 10*3/uL (ref 0.1–0.9)
MONO%: 8.5 % (ref 0.0–14.0)
NEUT%: 73.5 % (ref 39.0–75.0)
NEUTROS ABS: 6.3 10*3/uL (ref 1.5–6.5)
PLATELETS: 290 10*3/uL (ref 140–400)
RBC: 4.34 10*6/uL (ref 4.20–5.82)
RDW: 15 % — AB (ref 11.0–14.6)
WBC: 8.6 10*3/uL (ref 4.0–10.3)
lymph#: 1.2 10*3/uL (ref 0.9–3.3)

## 2015-03-07 LAB — COMPREHENSIVE METABOLIC PANEL (CC13)
ALBUMIN: 2.7 g/dL — AB (ref 3.5–5.0)
ALK PHOS: 90 U/L (ref 40–150)
ALT: <9 U/L (ref 0–55)
ANION GAP: 7 meq/L (ref 3–11)
AST: 11 U/L (ref 5–34)
BILIRUBIN TOTAL: 0.35 mg/dL (ref 0.20–1.20)
BUN: 11.3 mg/dL (ref 7.0–26.0)
CO2: 31 mEq/L — ABNORMAL HIGH (ref 22–29)
Calcium: 9.1 mg/dL (ref 8.4–10.4)
Chloride: 105 mEq/L (ref 98–109)
Creatinine: 0.8 mg/dL (ref 0.7–1.3)
EGFR: 83 mL/min/{1.73_m2} — AB (ref >90)
GLUCOSE: 160 mg/dL — AB (ref 70–140)
POTASSIUM: 4.4 meq/L (ref 3.5–5.1)
SODIUM: 143 meq/L (ref 136–145)
TOTAL PROTEIN: 6.6 g/dL (ref 6.4–8.3)

## 2015-03-07 MED ORDER — FOLIC ACID 1 MG PO TABS: 1.0000 mg | ORAL_TABLET | Freq: Every day | ORAL | Status: DC

## 2015-03-07 MED ORDER — DEXAMETHASONE 4 MG PO TABS: ORAL_TABLET | ORAL | Status: DC

## 2015-03-07 MED ORDER — CYANOCOBALAMIN 1000 MCG/ML IJ SOLN
1000.0000 ug | Freq: Once | INTRAMUSCULAR | Status: AC
  Administered 2015-03-07: 1000 ug via INTRAMUSCULAR

## 2015-03-07 MED ORDER — CYANOCOBALAMIN 1000 MCG/ML IJ SOLN
INTRAMUSCULAR | Status: AC
  Filled 2015-03-07: qty 1

## 2015-03-07 MED ORDER — PROCHLORPERAZINE MALEATE 10 MG PO TABS: 10.0000 mg | ORAL_TABLET | Freq: Four times a day (QID) | ORAL | Status: DC | PRN

## 2015-03-07 NOTE — Progress Notes (Signed)
[facility] [phone]   [phone]  OFFICE PROGRESS NOTE  [name], MD [place] [address] [place] [address]  DIAGNOSIS: Stage IV (T2b, N2, M1b) non-small cell lung cancer, adenocarcinoma, with negative EGFR mutation, presented with right upper lobe lung mass in addition to pleural based masses, mediastinal lymphadenopathy as well as metastatic disease to the left adrenal, bone and abdominal lymph nodes diagnosed in August 2016.  PRIOR THERAPY: Status post right Pleurx catheter placement under the care of [name] on 03/01/2015  CURRENT THERAPY: Systemic chemotherapy with carboplatin for AUC of 5 and Alimta 500 MG/M2. First cycle expected on 03/14/2015.  INTERVAL HISTORY: Sean Cox 81 y.o. male returns to the clinic today for follow-up visit accompanied by his daughter. The patient is feeling fine today except for mild right-sided chest pain in addition to shortness breath with exertion and mild constipation. He recently underwent right sided Pleurx catheter placement by [name] and he is feeling much better with improvement of his shortness of breath. Blood test for EGFR mutation by [facility] showed negative EGFR mutation. The patient also had blood sample sent to [facility] 4 additional molecular studies and these results are still pending. The patient denied having any significant weight loss or night sweats. He denied having any nausea or vomiting, no fever or chills. He denied having any headache or visual changes. He is here today for evaluation and discussion of his treatment options. He also has several questions about his diagnosis, prognosis and treatment options.  MEDICAL HISTORY: Past Medical History  Diagnosis Date  . Depression   . Hypothyroidism   . Hypercholesteremia   . Glaucoma   . Prostate cancer   . Ulcerative proctitis   . DDD (degenerative disc disease), lumbosacral   . Anxiety   .  IBS (irritable bowel syndrome)   . Detached retina   . Impaired fasting glucose   . Heart murmur     recently dx'ed with this 4 weeks ago  . Hypertension     no longer on medications  . GERD (gastroesophageal reflux disease)     in his younger years    ALLERGIES:  is allergic to codeine; imuran; remicade; simvastatin; and sulfa antibiotics.  MEDICATIONS:  Current Outpatient Prescriptions  Medication Sig Dispense Refill  . APRISO 0.375 G 24 hr capsule Take 8 capsules by mouth daily.   11  . atorvastatin (LIPITOR) 20 MG tablet Take 20 mg by mouth daily.   1  . dorzolamide (TRUSOPT) 2 % ophthalmic solution Place 1 drop into both eyes 3 (three) times daily.  3  . levothyroxine (SYNTHROID, LEVOTHROID) 25 MCG tablet Take 25 mcg by mouth daily.   1  . lisinopril-hydrochlorothiazide (PRINZIDE,ZESTORETIC) 20-12.5 MG per tablet   2  . naproxen sodium (ANAPROX) 220 MG tablet Take 220 mg by mouth 2 (two) times daily as needed (pain).    [name] PARoxetine (PAXIL) 40 MG tablet TAKE 1 TABLET BY MOUTH DAILY  3  . temazepam (RESTORIL) 15 MG capsule Take 15 mg by mouth at bedtime as needed for sleep.    [name] timolol (TIMOPTIC) 0.5 % ophthalmic solution Place 1 drop into both eyes 3 (three) times daily.  3  . senna (SENOKOT) 8.6 MG tablet Take 1 tablet by mouth daily as needed for constipation.     . traMADol (ULTRAM) 50 MG tablet Take 1-2 tablets (50-100 mg total) by mouth every 6 (six) hours as needed (  pain). (Patient not taking: Reported on 03/07/2015) 40 tablet 0   No current facility-administered medications for this visit.    SURGICAL HISTORY:  Past Surgical History  Procedure Laterality Date  . Colon surgery    . Prostatectomy      2004- Dr Risa Grill  . Tonsillectomy    . Diverticular stricture removed      1992  . Fatty tumor excision      in chest  . Chest tube insertion Right 03/01/2015    Procedure: INSERTION PLEURAL DRAINAGE CATHETER RIGHT CHEST;  Surgeon: [name], MD;  Location:  [facility];  Service: Thoracic;  Laterality: Right;    REVIEW OF SYSTEMS:  Constitutional: negative Eyes: negative Ears, nose, mouth, throat, and face: negative Respiratory: positive for dyspnea on exertion and pleurisy/chest pain Cardiovascular: negative Gastrointestinal: negative Genitourinary:negative Integument/breast: negative Hematologic/lymphatic: negative Musculoskeletal:negative Neurological: negative Behavioral/Psych: negative Endocrine: negative Allergic/Immunologic: negative   PHYSICAL EXAMINATION: General appearance: alert, cooperative, fatigued and no distress Head: Normocephalic, without obvious abnormality, atraumatic Neck: no adenopathy, no JVD, supple, symmetrical, trachea midline and thyroid not enlarged, symmetric, no tenderness/mass/nodules Lymph nodes: Cervical, supraclavicular, and axillary nodes normal. Resp: clear to auscultation bilaterally Back: symmetric, no curvature. ROM normal. No CVA tenderness. Cardio: regular rate and rhythm, S1, S2 normal, no murmur, click, rub or gallop GI: soft, non-tender; bowel sounds normal; no masses,  no organomegaly Extremities: extremities normal, atraumatic, no cyanosis or edema Neurologic: Alert and oriented X 3, normal strength and tone. Normal symmetric reflexes. Normal coordination and gait  ECOG PERFORMANCE STATUS: 1 - Symptomatic but completely ambulatory  Blood pressure 113/63, pulse 93, temperature 98.1 F (36.7 C), temperature source Oral, resp. rate 17, height $RemoveBe'5\' 8"'WMgDInkPd$  (1.727 m), weight 187 lb 3.2 oz (84.913 kg), SpO2 93 %.  LABORATORY DATA: Lab Results  Component Value Date   WBC 8.6 03/07/2015   HGB 12.6* 03/07/2015   HCT 39.4 03/07/2015   MCV 90.7 03/07/2015   PLT 290 03/07/2015      Chemistry      Component Value Date/Time   NA 140 03/01/2015 0737   NA 142 02/24/2015 1355   K 4.3 03/01/2015 0737   K 4.2 02/24/2015 1355   CL 101 03/01/2015 0737   CO2 30 03/01/2015 0737   CO2 33* 02/24/2015 1355    BUN 27* 03/01/2015 0737   BUN 12.6 02/24/2015 1355   CREATININE 1.03 03/01/2015 0737   CREATININE 0.7 02/24/2015 1355      Component Value Date/Time   CALCIUM 9.4 03/01/2015 0737   CALCIUM 9.2 02/24/2015 1355   ALKPHOS 86 03/01/2015 0737   ALKPHOS 87 02/24/2015 1355   AST 15 03/01/2015 0737   AST 11 02/24/2015 1355   ALT 10* 03/01/2015 0737   ALT 8 02/24/2015 1355   BILITOT 0.3 03/01/2015 0737   BILITOT 0.30 02/24/2015 1355       RADIOGRAPHIC STUDIES: Dg Chest 2 View  02/27/2015   CLINICAL DATA:  Adenocarcinoma.  EXAM: CHEST  2 VIEW  COMPARISON:  Chest x-ray 02/16/2015. PET-CT 02/16/2015. Chest CT 02/06/2015.  FINDINGS: Persistent upper lobe mass with right hilar fullness is noted. Recurrent prominent right pleural effusion. Left lung is stable. Heart size is stable. Interval resolution of right pneumothorax.  IMPRESSION: 1. Recurrent right pleural effusion, moderate-sized. Interval resolution of right pneumothorax. 2. New right upper lobe mass and right hilar fullness is stable.   Electronically Signed   By: [place]   On: 02/27/2015 11:59   Dg Chest 2 View  02/16/2015   CLINICAL DATA:  Followup pleural effusion. Right thoracentesis 02/08/2015. Lung mass.  EXAM: CHEST  2 VIEW  COMPARISON:  The chest x-ray 02/14/2015.  FINDINGS: Right upper lobe mass lesion unchanged. Right pleural effusion unchanged. Compressive atelectasis right lower lobe also unchanged.  Left lung is clear. Negative for heart failure or edema. Negative for pneumonia.  IMPRESSION: Right upper lobe mass lesion unchanged.  Right pleural effusion and right lower lobe atelectasis unchanged. Left lung remains clear.   Electronically Signed   By: Franchot Gallo M.D.   On: 02/16/2015 13:45   Dg Chest 2 View  02/14/2015   CLINICAL DATA:  Shortness of breath and hypertension ; pleural effusion  EXAM: CHEST  2 VIEW  COMPARISON:  February 08, 2015 chest radiograph and chest CT February 06, 2015  FINDINGS: There is a moderate  pleural effusion on the right with volume loss in the right base. The previously noted mass in the medial right upper hemithorax is again noted measuring 4.3 x 4.1 cm.  Left lung is clear. Heart size and pulmonary vascularity are normal. No apparent adenopathy. There is degenerative change in the thoracic spine.  IMPRESSION: The mass in the medial right upper hemithorax is stable. There is a moderate right effusion with right base volume loss. This effusion is very little changed from most recent chest radiograph. No new opacity. Left lung clear. No change in cardiac silhouette. No pneumothorax.   Electronically Signed   By: Lowella Grip III M.D.   On: 02/14/2015 11:04   Dg Chest 2 View  02/08/2015   CLINICAL DATA:  New diagnosis of lung mass, thoracentesis today  EXAM: CHEST  2 VIEW  COMPARISON:  CT chest of 02/06/2015  FINDINGS: After right thoracentesis there does appear to have been reduction in volume of the right pleural effusion. No pneumothorax is seen. Right basilar atelectasis remains. The pleural-based mass in the medial right upper hemi thorax is again noted. The left lung is clear. Heart size is stable.  IMPRESSION: 1. Decrease in volume of right pleural effusion after right thoracentesis. No pneumothorax. 2. No change in medial right upper hemi thorax mass worrisome for neoplasm.   Electronically Signed   By: Ivar Drape M.D.   On: 02/08/2015 16:55   Ct Chest W Contrast  02/06/2015   CLINICAL DATA:  Patient with recent chest x-ray demonstrating right upper lobe and perihilar masses. Diffuse pain. Shortness of breath. Prior prostatectomy.  EXAM: CT CHEST, ABDOMEN, AND PELVIS WITH CONTRAST  TECHNIQUE: Multidetector CT imaging of the chest, abdomen and pelvis was performed following the standard protocol during bolus administration of intravenous contrast.  CONTRAST:  112mL ISOVUE-300 IOPAMIDOL (ISOVUE-300) INJECTION 61%  COMPARISON:  Chest radiograph 02/01/2015 ; CT abdomen pelvis 06/23/2004   FINDINGS: CT CHEST FINDINGS  Mediastinum/Nodes: Multiple enlarged right hilar and mediastinal lymph nodes including a 2.2 cm subcarinal lymph node (image 37; series 3). Enlarged right hilar lymph node measuring 3.0 cm (image 36; series 3). Additional adjacent abnormal right hilar soft tissue. Prominent right peritracheal lymph node measuring 9 mm (image 30; series 3). Normal heart size. No pericardial effusion. Ascending thoracic aorta measures 4.0 cm. Normal caliber main pulmonary artery. Right hemi thorax mass and effusion narrow the superior vena cava.  Lungs/Pleura: Central airways are patent. There is a 4.4 x 5.4 cm subpleural mass within right upper hemi thorax involving the right upper lobe (image 28; series 3). Extensive enhancing nodularity involving the right pleural space. Additionally there is an  11 mm enhancing soft tissue mass between anterior right third and fourth ribs (image 35; series 3) and a 1.9 cm enhancing mass along the undersurface of the anterior right fourth rib. Large right pleural effusion. Near complete collapse of right lung.  There is a 1.3 cm nodule within the left lower lobe (image 50; series 7) and a 0.7 cm nodule within the left lower lobe (image 42; series 7). No left-sided pleural effusion.  CT ABDOMEN AND PELVIS FINDINGS  Hepatobiliary: Small calcification within the hepatic parenchyma. No focal hepatic lesions are identified. The gallbladder is unremarkable. No intrahepatic or extrahepatic biliary ductal dilatation.  Pancreas: Unremarkable  Spleen: Unremarkable  Adrenals/Urinary Tract: There is a 1.8 cm indeterminate nodule within the left adrenal gland (image 60; series 3) with an internal density of 60 Hounsfield units. More inferiorly within the left adrenal gland there is a stable 1.1 cm nodule likely representing an adenoma (image 65; series 3). Irregular nodularity involving the right adrenal gland. Kidneys enhance symmetrically with contrast. No hydronephrosis. Urinary  bladder is decompressed. No nephroureterolithiasis.  Stomach/Bowel: No abnormal bowel wall thickening or evidence for bowel obstruction. No free fluid or free intraperitoneal air. Oral contrast material demonstrated to the level of the rectum.  Vascular/Lymphatic: Infrarenal abdominal aortic ectasia measuring 3.2 cm. Peripheral calcified atherosclerotic plaque involving the abdominal aorta. Enlarged right periaortic lymph node measuring 1.4 cm (image 70; series 3). Large left periaortic lymph node (image 87; series 3) measuring 1.6 cm.  Other: None  Musculoskeletal: Multiple anterior right chest wall metastasis described above. Sclerosis involving the right aspect of the T10 vertebral body. Lower thoracic and lumbar spine degenerative changes.  IMPRESSION: Right upper lobe subpleural mass concerning for the possibility of primary pulmonary malignancy. Extensive enhancing nodularity along right pleural space as well as anteriorly involving the right chest wall, extensive right hilar and mediastinal adenopathy, abdominal retroperitoneal adenopathy and multiple pulmonary nodules within the left lower lobe concerning for metastatic disease.  Probable large malignant right pleural effusion with near total collapse of the right lung. There is minimal aeration of the right middle and right upper lobes. Evaluation for additional right pulmonary nodules is limited due to non aeration of the right lung.  Narrowing of superior vena cava. Recommend clinical correlation for the possibility of SVC syndrome.  New left adrenal mass concerning for the possibility of metastatic disease. Additional stable left adrenal nodule likely representing adenoma.  Patchy sclerosis involving the right aspect of the T10 vertebral body potentially degenerative in etiology. Osseous metastatic disease not excluded.  Ascending thoracic aorta measures 4 cm. Recommend semi-annual imaging followup by CTA or MRA and referral to cardiothoracic surgery if  not already obtained. This recommendation follows 2010 ACCF/AHA/AATS/ACR/ASA/SCA/SCAI/SIR/STS/SVM Guidelines for the Diagnosis and Management of Patients With Thoracic Aortic Disease. Circulation. 2010; 121: e266-e36  Infrarenal abdominal aortic ectasia.  These results will be called to the ordering clinician or representative by the Radiologist Assistant, and communication documented in the PACS or zVision Dashboard.   Electronically Signed   By: Lovey Newcomer M.D.   On: 02/06/2015 09:53   Mr Jeri Cos BS Contrast  02/17/2015   CLINICAL DATA:  Lung cancer.  EXAM: MRI HEAD WITHOUT AND WITH CONTRAST  TECHNIQUE: Multiplanar, multiecho pulse sequences of the brain and surrounding structures were obtained without and with intravenous contrast.  CONTRAST:  82mL MULTIHANCE GADOBENATE DIMEGLUMINE 529 MG/ML IV SOLN  COMPARISON:  CT head without contrast 12/17/2011.  FINDINGS: Mild atrophy and white matter changes are within normal  limits for age. White matter changes extend into the brainstem. The basal ganglia are otherwise within normal limits.  No acute infarct, hemorrhage, or mass lesion is present. The ventricles are proportionate to the degree of atrophy. No significant extra-axial fluid collection is present.  Flow is present in the major intracranial arteries. Scleral banding is noted on the right. Bilateral lens replacements are present. Mild mucosal thickening is present along the floor of the right maxillary sinus. The remaining paranasal sinuses and mastoid air cells are clear.  The postcontrast images demonstrate no pathologic enhancement to suggest metastatic disease of the brain or meninges.  Skullbase is within normal limits. Midline structures are unremarkable.  IMPRESSION: 1. No evidence for metastatic disease to the brain. 2. Mild generalized atrophy and white matter disease is within normal limits for age. 3. Postsurgical changes of both globes.   Electronically Signed   By: San Morelle M.D.   On:  02/17/2015 21:54   Ct Abdomen Pelvis W Contrast  02/06/2015   CLINICAL DATA:  Patient with recent chest x-ray demonstrating right upper lobe and perihilar masses. Diffuse pain. Shortness of breath. Prior prostatectomy.  EXAM: CT CHEST, ABDOMEN, AND PELVIS WITH CONTRAST  TECHNIQUE: Multidetector CT imaging of the chest, abdomen and pelvis was performed following the standard protocol during bolus administration of intravenous contrast.  CONTRAST:  187mL ISOVUE-300 IOPAMIDOL (ISOVUE-300) INJECTION 61%  COMPARISON:  Chest radiograph 02/01/2015 ; CT abdomen pelvis 06/23/2004  FINDINGS: CT CHEST FINDINGS  Mediastinum/Nodes: Multiple enlarged right hilar and mediastinal lymph nodes including a 2.2 cm subcarinal lymph node (image 37; series 3). Enlarged right hilar lymph node measuring 3.0 cm (image 36; series 3). Additional adjacent abnormal right hilar soft tissue. Prominent right peritracheal lymph node measuring 9 mm (image 30; series 3). Normal heart size. No pericardial effusion. Ascending thoracic aorta measures 4.0 cm. Normal caliber main pulmonary artery. Right hemi thorax mass and effusion narrow the superior vena cava.  Lungs/Pleura: Central airways are patent. There is a 4.4 x 5.4 cm subpleural mass within right upper hemi thorax involving the right upper lobe (image 28; series 3). Extensive enhancing nodularity involving the right pleural space. Additionally there is an 11 mm enhancing soft tissue mass between anterior right third and fourth ribs (image 35; series 3) and a 1.9 cm enhancing mass along the undersurface of the anterior right fourth rib. Large right pleural effusion. Near complete collapse of right lung.  There is a 1.3 cm nodule within the left lower lobe (image 50; series 7) and a 0.7 cm nodule within the left lower lobe (image 42; series 7). No left-sided pleural effusion.  CT ABDOMEN AND PELVIS FINDINGS  Hepatobiliary: Small calcification within the hepatic parenchyma. No focal hepatic  lesions are identified. The gallbladder is unremarkable. No intrahepatic or extrahepatic biliary ductal dilatation.  Pancreas: Unremarkable  Spleen: Unremarkable  Adrenals/Urinary Tract: There is a 1.8 cm indeterminate nodule within the left adrenal gland (image 60; series 3) with an internal density of 60 Hounsfield units. More inferiorly within the left adrenal gland there is a stable 1.1 cm nodule likely representing an adenoma (image 65; series 3). Irregular nodularity involving the right adrenal gland. Kidneys enhance symmetrically with contrast. No hydronephrosis. Urinary bladder is decompressed. No nephroureterolithiasis.  Stomach/Bowel: No abnormal bowel wall thickening or evidence for bowel obstruction. No free fluid or free intraperitoneal air. Oral contrast material demonstrated to the level of the rectum.  Vascular/Lymphatic: Infrarenal abdominal aortic ectasia measuring 3.2 cm. Peripheral calcified atherosclerotic plaque involving  the abdominal aorta. Enlarged right periaortic lymph node measuring 1.4 cm (image 70; series 3). Large left periaortic lymph node (image 87; series 3) measuring 1.6 cm.  Other: None  Musculoskeletal: Multiple anterior right chest wall metastasis described above. Sclerosis involving the right aspect of the T10 vertebral body. Lower thoracic and lumbar spine degenerative changes.  IMPRESSION: Right upper lobe subpleural mass concerning for the possibility of primary pulmonary malignancy. Extensive enhancing nodularity along right pleural space as well as anteriorly involving the right chest wall, extensive right hilar and mediastinal adenopathy, abdominal retroperitoneal adenopathy and multiple pulmonary nodules within the left lower lobe concerning for metastatic disease.  Probable large malignant right pleural effusion with near total collapse of the right lung. There is minimal aeration of the right middle and right upper lobes. Evaluation for additional right pulmonary  nodules is limited due to non aeration of the right lung.  Narrowing of superior vena cava. Recommend clinical correlation for the possibility of SVC syndrome.  New left adrenal mass concerning for the possibility of metastatic disease. Additional stable left adrenal nodule likely representing adenoma.  Patchy sclerosis involving the right aspect of the T10 vertebral body potentially degenerative in etiology. Osseous metastatic disease not excluded.  Ascending thoracic aorta measures 4 cm. Recommend semi-annual imaging followup by CTA or MRA and referral to cardiothoracic surgery if not already obtained. This recommendation follows 2010 ACCF/AHA/AATS/ACR/ASA/SCA/SCAI/SIR/STS/SVM Guidelines for the Diagnosis and Management of Patients With Thoracic Aortic Disease. Circulation. 2010; 121: [id number]  Infrarenal abdominal aortic ectasia.  These results will be called to the ordering clinician or representative by the Radiologist Assistant, and communication documented in the PACS or zVision Dashboard.   Electronically Signed   By: Lovey Newcomer M.D.   On: 02/06/2015 09:53   Nm Pet Image Initial (pi) Skull Base To Thigh  02/16/2015   CLINICAL DATA:  Initial treatment strategy for status post thoracentesis. New lung mass.  EXAM: NUCLEAR MEDICINE PET SKULL BASE TO THIGH  TECHNIQUE: 9.3 mCi F-18 FDG was injected intravenously. Full-ring PET imaging was performed from the skull base to thigh after the radiotracer. CT data was obtained and used for attenuation correction and anatomic localization.  FASTING BLOOD GLUCOSE:  Value:  116 Mg/dl  COMPARISON:  Chest abdomen and pelvic CT of 02/06/2015.  FINDINGS: NECK  No cervical hypermetabolism.  CHEST  Spiculated right upper lobe lung mass measures 4.6 x 3.6 cm and a S.U.V. max of 13.8 on image 24 of series 6.  Mediastinal and right hilar nodal metastasis. A right low paratracheal node measures a S.U.V. max of 8.5 on image 58 of series 4.  Extensive right-sided pleural  hypermetabolism. Example anterior at the site of pleural thickening, measuring a S.U.V. max of 7.4 on image 66 of series 4.  ABDOMEN/PELVIS  Left adrenal hypermetabolic metastasis. 1.6 cm and a S.U.V. max of 9.2, image 93.  Right adrenal hypermetabolism is nonspecific ; minimal nodularity without well-defined mass. This measures a S.U.V. max of 4.2.  SKELETON  Left iliac osseous metastasis. Example at a S.U.V. max of 6.7 on image 140. A H54 hypermetabolic metastasis measures a S.U.V. max of 7.0.  CT IMAGES PERFORMED FOR ATTENUATION CORRECTION  Carotid atherosclerosis. Chest abdomen and pelvic findings deferred to recent diagnostic CTs. Moderate right pleural effusion is similar. Volume loss in the right lower and right middle lobes. Scattered pulmonary nodules which are below the resolution of PET. Non aneurysmal infrarenal aortic dilatation. Prostatectomy.  IMPRESSION: 1. Stage IV primary bronchogenic carcinoma. Right upper  lobe lung mass with metastasis to the right pleural space, thoracic nodes, left adrenal gland, and bones. Equivocal right adrenal hypermetabolism with minimal nodularity. 2. Similar moderate right pleural effusion, likely malignant.   Electronically Signed   By: Abigail Miyamoto M.D.   On: 02/16/2015 09:02   Dg Chest 2v Repeat Same Day  02/16/2015   CLINICAL DATA:  Status post right-sided thoracentesis.  EXAM: CHEST - 2 VIEW SAME DAY  COMPARISON:  Chest x-ray of today's date  FINDINGS: The lungs are adequately inflated. The pleural effusion on the right is smaller than the pre procedure study. A faint pleural line is suspected in the upper hemi thorax suspicious for approximately 10-15% pneumothorax. There is no mediastinal shift. The left lung is clear. The heart and pulmonary vascularity are normal. A right suprahilar-paratracheal mass is stable.  IMPRESSION: 1. Probable 10-15% right-sided pneumothorax. A repeat chest x-ray at end expiration is recommended. These results were called by telephone  at the time of interpretation on 02/16/2015 at 2:47 pm to Levonne Spiller, RN, who verbally acknowledged these results. 2. Decreased volume of pleural fluid on the right.   Electronically Signed   By: Elery  Martinique M.D.   On: 02/16/2015 14:52   Dg C-arm 1-60 Min-no Report  03/01/2015   CLINICAL DATA: surgery   C-ARM 1-60 MINUTES  Fluoroscopy was utilized by the requesting physician.  No radiographic  interpretation.     ASSESSMENT AND PLAN: This is a very pleasant 81 years old white male recently diagnosed with metastatic non-small cell lung cancer, adenocarcinoma with negative EGFR mutation. The other molecular studies are still pending. I had a lengthy discussion with the patient and his daughter today about his current disease status, prognosis and treatment options. I discussed with the patient and his prognosis with and without treatment. The patient understands that the average survival without treatment for stage IV lung cancer range between 3-6 months and without treatment this could be increased up to 12 months. The patient was given the option of palliative care versus palliative systemic chemotherapy. He is interested in proceeding with systemic treatment. I recommended for him regimen consisting of carboplatin for AUC of 5 and Alimta 500 MG/M2 every 3 weeks. I discussed with the patient adverse effects of this treatment including but not limited to alopecia, myelosuppression, nausea and vomiting, peripheral neuropathy, liver or renal dysfunction. We will arrange for the patient to receive vitamin B 12 injection today. The patient would also receive prescription for Compazine 10 mg by mouth every 6 hours as needed for nausea, Decadron 4 mg by mouth twice a day, the day before, day of and day after the chemotherapy in addition to folic acid 1 mg by mouth daily. I will arrange for the patient to have a chemotherapy education class before starting the first dose of the chemotherapy. He is expected  to start the first cycle of this treatment on 03/14/2015. I may also consider the patient for Port-A-Cath placement by [name] have the molecular studies showed no actionable with mutations as the patient would require systemic chemotherapy at regular basis. The patient would come back for follow-up visit in 2 weeks for reevaluation and management of any adverse effect of his treatment. For the right pleural effusion, he will continue drainage of the fluid via the Pleurx catheter. The patient and his daughter had several questions today and I answered them completely to their satisfaction. We also discussed consideration of second opinion and I offered the patient his second  opinion at [facility] or [facility] but the patient declined it at this point. He was advised to call immediately if he has any concerning symptoms in the interval. The patient voices understanding of current disease status and treatment options and is in agreement with the current care plan.  All questions were answered. The patient knows to call the clinic with any problems, questions or concerns. We can certainly see the patient much sooner if necessary.  I spent 35 minutes counseling the patient face to face. The total time spent in the appointment was 45 minutes.  Disclaimer: This note was dictated with voice recognition software. Similar sounding words can inadvertently be transcribed and may not be corrected upon review.

## 2015-03-07 NOTE — Telephone Encounter (Signed)
Gave and printed appt sched and avs for pt for Sept thru [facility]

## 2015-03-09 ENCOUNTER — Other Ambulatory Visit: Payer: Medicare Other

## 2015-03-09 ENCOUNTER — Telehealth: Payer: Self-pay | Admitting: Internal Medicine

## 2015-03-09 NOTE — Telephone Encounter (Signed)
Pt came in needed to change time for [employer], pt confirmed time... KJ

## 2015-03-10 ENCOUNTER — Ambulatory Visit: Payer: Medicare Other

## 2015-03-10 ENCOUNTER — Emergency Department (HOSPITAL_COMMUNITY): Payer: Medicare Other

## 2015-03-10 ENCOUNTER — Encounter (HOSPITAL_COMMUNITY): Payer: Self-pay | Admitting: Emergency Medicine

## 2015-03-10 ENCOUNTER — Emergency Department (HOSPITAL_COMMUNITY)
Admission: EM | Admit: 2015-03-10 | Discharge: 2015-03-10 | Disposition: A | Discharge status: Home / Self Care | Payer: Medicare Other | Attending: Emergency Medicine | Admitting: Emergency Medicine

## 2015-03-10 ENCOUNTER — Telehealth: Payer: Self-pay | Admitting: *Deleted

## 2015-03-10 DIAGNOSIS — Z8739 Personal history of other diseases of the musculoskeletal system and connective tissue: Secondary | ICD-10-CM | POA: Diagnosis not present

## 2015-03-10 DIAGNOSIS — R011 Cardiac murmur, unspecified: Secondary | ICD-10-CM | POA: Diagnosis not present

## 2015-03-10 DIAGNOSIS — E039 Hypothyroidism, unspecified: Secondary | ICD-10-CM | POA: Insufficient documentation

## 2015-03-10 DIAGNOSIS — H409 Unspecified glaucoma: Secondary | ICD-10-CM | POA: Insufficient documentation

## 2015-03-10 DIAGNOSIS — Z87891 Personal history of nicotine dependence: Secondary | ICD-10-CM | POA: Insufficient documentation

## 2015-03-10 DIAGNOSIS — F329 Major depressive disorder, single episode, unspecified: Secondary | ICD-10-CM | POA: Insufficient documentation

## 2015-03-10 DIAGNOSIS — F419 Anxiety disorder, unspecified: Secondary | ICD-10-CM | POA: Diagnosis not present

## 2015-03-10 DIAGNOSIS — I10 Essential (primary) hypertension: Secondary | ICD-10-CM | POA: Insufficient documentation

## 2015-03-10 DIAGNOSIS — K589 Irritable bowel syndrome without diarrhea: Secondary | ICD-10-CM | POA: Diagnosis not present

## 2015-03-10 DIAGNOSIS — E780 Pure hypercholesterolemia: Secondary | ICD-10-CM | POA: Insufficient documentation

## 2015-03-10 DIAGNOSIS — Z79899 Other long term (current) drug therapy: Secondary | ICD-10-CM | POA: Insufficient documentation

## 2015-03-10 DIAGNOSIS — K5900 Constipation, unspecified: Secondary | ICD-10-CM | POA: Diagnosis not present

## 2015-03-10 DIAGNOSIS — Z8546 Personal history of malignant neoplasm of prostate: Secondary | ICD-10-CM | POA: Insufficient documentation

## 2015-03-10 MED ORDER — ACETAMINOPHEN 325 MG PO TABS
650.0000 mg | ORAL_TABLET | Freq: Once | ORAL | Status: AC
  Administered 2015-03-10: 650 mg via ORAL
  Filled 2015-03-10: qty 2

## 2015-03-10 MED ORDER — POLYETHYLENE GLYCOL 3350 17 GM/SCOOP PO POWD: 17.0000 g | Freq: Two times a day (BID) | ORAL | Status: DC

## 2015-03-10 NOTE — ED Notes (Signed)
Off floor for testing 

## 2015-03-10 NOTE — ED Notes (Signed)
Pt is on the bedside commode still having results from the enema.  Pt has had d/c instructions done however pt has been informed he can stay in the room until he is finished having his BM.  [place], CN is aware.

## 2015-03-10 NOTE — Telephone Encounter (Signed)
This RN called and spoke to patient regarding constipation. I asked patient, "when did you take the magnesium citrate?" Patient stated,"you've got the story wrong. All you girls do is read each others notes and no one gets the story right. I'm on the way to the emergency room. Have a nice day." He hung up on the triage nurse.

## 2015-03-10 NOTE — ED Notes (Signed)
Patient is still on bedside commode-states he is still going-wants to sit there a little longer

## 2015-03-10 NOTE — ED Notes (Signed)
Patient states "he thinks he can go"-some stool leakage in brief-ambulated to bathroom with assistance

## 2015-03-10 NOTE — ED Notes (Signed)
500 cc placed-patient unable to tolerate anymore-assisted to bedside commode, call light at side

## 2015-03-10 NOTE — Telephone Encounter (Signed)
Oncology Nurse Navigator Documentation  Oncology Nurse Navigator Flowsheets 03/10/2015  Navigator Encounter Type Telephone/I received a call from Sean Cox today.  He stated he was constipated and has not had a BM in 4 days.  He stated he was told to try Mag Cit and he did.  He did not have results form this.  I asked if he has called triage nurse.  He stated no.  I encouraged him to call for further assistance.  I gave him triage phone number to call.  He said he would call.   Patient Visit Type Follow-up  Treatment Phase Abnormal Scans  Barriers/Navigation Needs -  Interventions Other  Time Spent with Patient 15

## 2015-03-10 NOTE — Discharge Instructions (Signed)

## 2015-03-10 NOTE — ED Notes (Signed)
Pt states "im severely, severely constipated".  Pt states that he hasnt had BM in 5-6 days. Pt states that he has taken stool softeners and mag citrate over the past couple of days but hasnt had any success. Pt having "pains that i need to go to the bathroom but i cant'.

## 2015-03-10 NOTE — ED Provider Notes (Signed)
CSN: [id number]     Arrival date & time 03/10/15  1430 History   First MD Initiated Contact with Patient 03/10/15 1514     Chief Complaint  Patient presents with  . Constipation    HPI Patient presents the emergency department complaining of constipation.  His had no bowel movement over the past 4-5 days.  He is not on any narcotic medication.  His had intermittent constipation before.  He's tried mag citrate at home without improvement.  He feels like he needs to have a bowel movement but cannot pass anything.  He denies abdominal pain.  No nausea or vomiting.  Denies fevers and chills.  Patient has a history of ulcer colitis status post colectomy more than 20 years ago.  He never had a colostomy.  He recently was diagnosed with non-small cell lung cancer.  He is preparing to begin chemotherapy in 4 days.  Denies chest pain or shortness of breath at this time.   Past Medical History  Diagnosis Date  . Depression   . Hypothyroidism   . Hypercholesteremia   . Glaucoma   . Prostate cancer   . Ulcerative proctitis   . DDD (degenerative disc disease), lumbosacral   . Anxiety   . IBS (irritable bowel syndrome)   . Detached retina   . Impaired fasting glucose   . Heart murmur     recently dx'ed with this 4 weeks ago  . Hypertension     no longer on medications  . GERD (gastroesophageal reflux disease)     in his younger years   Past Surgical History  Procedure Laterality Date  . Colon surgery    . Prostatectomy      2004- Dr Risa Grill  . Tonsillectomy    . Diverticular stricture removed      1992  . Fatty tumor excision      in chest  . Chest tube insertion Right 03/01/2015    Procedure: INSERTION PLEURAL DRAINAGE CATHETER RIGHT CHEST;  Surgeon: [name], MD;  Location: [facility];  Service: Thoracic;  Laterality: Right;   No family history on file. Social History  Substance Use Topics  . Smoking status: Former Smoker -- 1.00 packs/day for 30 years    Types: Cigarettes    Quit date: 06/10/1981  . Smokeless tobacco: Never Used  . Alcohol Use: 1.8 oz/week    3 Cans of beer per week    Review of Systems  All other systems reviewed and are negative.     Allergies  Codeine; Imuran; Remicade; Simvastatin; and Sulfa antibiotics  Home Medications   Prior to Admission medications   Medication Sig Start Date End Date Taking? Authorizing Provider  acetaminophen (TYLENOL) 500 MG tablet Take 1,000 mg by mouth every 4 (four) hours as needed for mild pain, moderate pain, fever or headache.   Yes Historical Provider, MD  APRISO 0.375 G 24 hr capsule Take 8 capsules by mouth daily.  08/22/14  Yes Historical Provider, MD  atorvastatin (LIPITOR) 20 MG tablet Take 20 mg by mouth daily.  09/13/14  Yes Historical Provider, MD  dorzolamide (TRUSOPT) 2 % ophthalmic solution Place 1 drop into both eyes 3 (three) times daily. 09/15/14  Yes Historical Provider, MD  levothyroxine (SYNTHROID, LEVOTHROID) 25 MCG tablet Take 25 mcg by mouth daily.  09/05/14  Yes Historical Provider, MD  magnesium citrate SOLN Take 0.75 Bottles by mouth once.   Yes Historical Provider, MD  naproxen sodium (ANAPROX) 220 MG tablet Take 220 mg  by mouth 2 (two) times daily as needed (pain).   Yes Historical Provider, MD  PARoxetine (PAXIL) 40 MG tablet TAKE 1 TABLET BY MOUTH DAILY 12/21/14  Yes Historical Provider, MD  senna (SENOKOT) 8.6 MG tablet Take 1 tablet by mouth daily as needed for constipation.    Yes Historical Provider, MD  temazepam (RESTORIL) 15 MG capsule Take 15 mg by mouth at bedtime as needed for sleep.   Yes Historical Provider, MD  timolol (TIMOPTIC) 0.5 % ophthalmic solution Place 1 drop into both eyes 3 (three) times daily. 09/15/14  Yes Historical Provider, MD  traMADol (ULTRAM) 50 MG tablet Take 1-2 tablets (50-100 mg total) by mouth every 6 (six) hours as needed (pain). 03/01/15  Yes [name], MD  dexamethasone (DECADRON) 4 MG tablet 4 mg po bid the day before, day of and day  after chemo. 03/07/15   Curt Bears, MD  folic acid (FOLVITE) 1 MG tablet Take 1 tablet (1 mg total) by mouth daily. 03/07/15   Curt Bears, MD  polyethylene glycol powder (MIRALAX) powder Take 17 g by mouth 2 (two) times daily. 03/10/15   Jola Schmidt, MD  prochlorperazine (COMPAZINE) 10 MG tablet Take 1 tablet (10 mg total) by mouth every 6 (six) hours as needed for nausea or vomiting. 03/07/15   Curt Bears, MD   BP 158/98 mmHg  Pulse 85  Temp(Src) 97.6 F (36.4 C)  Resp 18  SpO2 94% Physical Exam  Constitutional: He is oriented to person, place, and time. He appears well-developed and well-nourished.  HENT:  Head: Normocephalic and atraumatic.  Eyes: EOM are normal.  Neck: Normal range of motion.  Cardiovascular: Normal rate, regular rhythm, normal heart sounds and intact distal pulses.   Pulmonary/Chest: Effort normal and breath sounds normal. No respiratory distress.  Abdominal: Soft. He exhibits no distension. There is no tenderness.  Genitourinary:  No rectal masses palpated on examination.  No fecal impaction noted at the level of his anus or distal rectum  Musculoskeletal: Normal range of motion.  Neurological: He is alert and oriented to person, place, and time.  Skin: Skin is warm and dry.  Psychiatric: He has a normal mood and affect. Judgment normal.  Nursing note and vitals reviewed.   ED Course  Procedures (including critical care time) Labs Review Labs Reviewed - No data to display  Imaging Review Dg Abd 2 Views  03/10/2015   CLINICAL DATA:  Constipation  EXAM: ABDOMEN - 2 VIEW  COMPARISON:  PET-CT February 16, 2015  FINDINGS: Supine and upright images obtained. There is diffuse stool throughout the colon. There is no bowel dilatation or air-fluid level suggesting obstruction. No free air. There are phleboliths in the pelvis. There is no edema or consolidation in the lung bases. A metastatic focus in the left iliac crest is better seen on CT.  IMPRESSION:  Diffuse stool throughout colon consistent with constipation. No obstruction or free air. Metastatic focus in the left iliac crest is better seen on CT.   Electronically Signed   By: Lowella Grip III M.D.   On: 03/10/2015 16:12   I have personally reviewed and evaluated these images and lab results as part of my medical decision-making.   EKG Interpretation None      MDM   Final diagnoses:  Constipation   7:05 PM Patient feels much better this time.  A large bowel movement here after soapsuds enema.  Discharge home on twice a day MiraLAX.  Outpatient primary care follow-up.  Jola Schmidt, MD 03/10/15 [phone]

## 2015-03-10 NOTE — Telephone Encounter (Addendum)
FYI Called patient to assess constipation further.  Spouse Shelly reports "he just left.  He's going to the ED.  Hasn't had a bowel movement in a week.  It's painful.  He won't take anything else or try enema, suppositories or anything I've suggested.  He hasn't been nauseated or vomited yet.  His stomach isn't bloated or large.  He's a stubborn man.  He loves to cook.  He doesn't drink enough.  He'll take pills while sucking an orange.  Edwena Bunde been married 44 years and i do not tell him what to do."   Thanked her for help and encouraged to help him drink at least 64 oz water daily, drink apple juice, warm and hot beverages and perhaps take laxative daily to promote better bowel habits.

## 2015-03-10 NOTE — ED Notes (Signed)
Unable to pass stool-will attempt enema

## 2015-03-10 NOTE — ED Notes (Signed)
Patient states a positive response from enema-MD informed

## 2015-03-14 ENCOUNTER — Other Ambulatory Visit (HOSPITAL_BASED_OUTPATIENT_CLINIC_OR_DEPARTMENT_OTHER): Payer: Medicare Other

## 2015-03-14 ENCOUNTER — Ambulatory Visit (HOSPITAL_BASED_OUTPATIENT_CLINIC_OR_DEPARTMENT_OTHER): Payer: Medicare Other

## 2015-03-14 VITALS — BP 147/89 | HR 79 | Temp 97.6°F | Resp 16

## 2015-03-14 DIAGNOSIS — C3411 Malignant neoplasm of upper lobe, right bronchus or lung: Secondary | ICD-10-CM

## 2015-03-14 DIAGNOSIS — Z5111 Encounter for antineoplastic chemotherapy: Secondary | ICD-10-CM

## 2015-03-14 DIAGNOSIS — C3491 Malignant neoplasm of unspecified part of right bronchus or lung: Secondary | ICD-10-CM

## 2015-03-14 LAB — COMPREHENSIVE METABOLIC PANEL (CC13)
ANION GAP: 7 meq/L (ref 3–11)
AST: 12 U/L (ref 5–34)
Albumin: 2.8 g/dL — ABNORMAL LOW (ref 3.5–5.0)
Alkaline Phosphatase: 85 U/L (ref 40–150)
BUN: 14.8 mg/dL (ref 7.0–26.0)
CALCIUM: 9 mg/dL (ref 8.4–10.4)
CHLORIDE: 107 meq/L (ref 98–109)
CO2: 27 mEq/L (ref 22–29)
CREATININE: 0.8 mg/dL (ref 0.7–1.3)
EGFR: 83 mL/min/{1.73_m2} — AB (ref >90)
Glucose: 194 mg/dl — ABNORMAL HIGH (ref 70–140)
POTASSIUM: 4.4 meq/L (ref 3.5–5.1)
Sodium: 141 mEq/L (ref 136–145)
Total Bilirubin: <0.3 mg/dL (ref 0.20–1.20)
Total Protein: 6.6 g/dL (ref 6.4–8.3)

## 2015-03-14 LAB — CBC WITH DIFFERENTIAL/PLATELET
BASO%: 0.3 % (ref 0.0–2.0)
BASOS ABS: 0 10*3/uL (ref 0.0–0.1)
EOS%: 0.3 % (ref 0.0–7.0)
Eosinophils Absolute: 0 10*3/uL (ref 0.0–0.5)
HEMATOCRIT: 37.9 % — AB (ref 38.4–49.9)
HGB: 12.3 g/dL — ABNORMAL LOW (ref 13.0–17.1)
LYMPH#: 0.9 10*3/uL (ref 0.9–3.3)
LYMPH%: 13.6 % — AB (ref 14.0–49.0)
MCH: 29.1 pg (ref 27.2–33.4)
MCHC: 32.3 g/dL (ref 32.0–36.0)
MCV: 89.8 fL (ref 79.3–98.0)
MONO#: 0.3 10*3/uL (ref 0.1–0.9)
MONO%: 4.6 % (ref 0.0–14.0)
NEUT#: 5.6 10*3/uL (ref 1.5–6.5)
NEUT%: 81.2 % — AB (ref 39.0–75.0)
PLATELETS: 263 10*3/uL (ref 140–400)
RBC: 4.22 10*6/uL (ref 4.20–5.82)
RDW: 14.7 % — ABNORMAL HIGH (ref 11.0–14.6)
WBC: 6.9 10*3/uL (ref 4.0–10.3)

## 2015-03-14 MED ORDER — SODIUM CHLORIDE 0.9 % IV SOLN
479.0000 mg | Freq: Once | INTRAVENOUS | Status: AC
  Administered 2015-03-14: 480 mg via INTRAVENOUS
  Filled 2015-03-14: qty 48

## 2015-03-14 MED ORDER — SODIUM CHLORIDE 0.9 % IV SOLN
500.0000 mg/m2 | Freq: Once | INTRAVENOUS | Status: AC
  Administered 2015-03-14: 1000 mg via INTRAVENOUS
  Filled 2015-03-14: qty 40

## 2015-03-14 MED ORDER — SODIUM CHLORIDE 0.9 % IV SOLN
Freq: Once | INTRAVENOUS | Status: AC
  Administered 2015-03-14: 10:00:00 via INTRAVENOUS

## 2015-03-14 MED ORDER — SODIUM CHLORIDE 0.9 % IV SOLN
Freq: Once | INTRAVENOUS | Status: AC
  Administered 2015-03-14: 10:00:00 via INTRAVENOUS
  Filled 2015-03-14: qty 8

## 2015-03-14 NOTE — Patient Instructions (Signed)
[facility] Discharge Instructions for Patients Receiving Chemotherapy  Today you received the following chemotherapy agents alimta, carboplatin  To help prevent nausea and vomiting after your treatment, we encourage you to take your nausea medication   If you develop nausea and vomiting that is not controlled by your nausea medication, call the clinic.   BELOW ARE SYMPTOMS THAT SHOULD BE REPORTED IMMEDIATELY:  *FEVER GREATER THAN 100.5 F  *CHILLS WITH OR WITHOUT FEVER  NAUSEA AND VOMITING THAT IS NOT CONTROLLED WITH YOUR NAUSEA MEDICATION  *UNUSUAL SHORTNESS OF BREATH  *UNUSUAL BRUISING OR BLEEDING  TENDERNESS IN MOUTH AND THROAT WITH OR WITHOUT PRESENCE OF ULCERS  *URINARY PROBLEMS  *BOWEL PROBLEMS  UNUSUAL RASH Items with * indicate a potential emergency and should be followed up as soon as possible.  Feel free to call the clinic you have any questions or concerns. The clinic phone number is [phone].  Please show the [facility] at check-in to the Emergency Department and triage nurse.  Pemetrexed injection What is this medicine? PEMETREXED (PEM e TREX ed) is a chemotherapy drug. This medicine affects cells that are rapidly growing, such as cancer cells and cells in your mouth and stomach. It is usually used to treat lung cancers like non-small cell lung cancer and mesothelioma. It may also be used to treat other cancers. This medicine may be used for other purposes; ask your health care provider or pharmacist if you have questions. COMMON BRAND NAME(S): Alimta What should I tell my health care provider before I take this medicine? They need to know if you have any of these conditions: -if you frequently drink alcohol containing beverages -infection (especially a virus infection such as chickenpox, cold sores, or herpes) -kidney disease -liver disease -low blood counts, like low platelets, red bloods, or white blood cells -an unusual or  allergic reaction to pemetrexed, mannitol, other medicines, foods, dyes, or preservatives -pregnant or trying to get pregnant -breast-feeding How should I use this medicine? This drug is given as an infusion into a vein. It is administered in a hospital or clinic by a specially trained health care professional. Talk to your pediatrician regarding the use of this medicine in children. Special care may be needed. Overdosage: If you think you have taken too much of this medicine contact a poison control center or emergency room at once. NOTE: This medicine is only for you. Do not share this medicine with others. What if I miss a dose? It is important not to miss your dose. Call your doctor or health care professional if you are unable to keep an appointment. What may interact with this medicine? -aspirin and aspirin-like medicines -medicines to increase blood counts like filgrastim, pegfilgrastim, sargramostim -methotrexate -NSAIDS, medicines for pain and inflammation, like ibuprofen or naproxen -probenecid -pyrimethamine -vaccines Talk to your doctor or health care professional before taking any of these medicines: -acetaminophen -aspirin -ibuprofen -ketoprofen -naproxen This list may not describe all possible interactions. Give your health care provider a list of all the medicines, herbs, non-prescription drugs, or dietary supplements you use. Also tell them if you smoke, drink alcohol, or use illegal drugs. Some items may interact with your medicine. What should I watch for while using this medicine? Visit your doctor for checks on your progress. This drug may make you feel generally unwell. This is not uncommon, as chemotherapy can affect healthy cells as well as cancer cells. Report any side effects. Continue your course of treatment even though you  feel ill unless your doctor tells you to stop. In some cases, you may be given additional medicines to help with side effects. Follow all  directions for their use. Call your doctor or health care professional for advice if you get a fever, chills or sore throat, or other symptoms of a cold or flu. Do not treat yourself. This drug decreases your body's ability to fight infections. Try to avoid being around people who are sick. This medicine may increase your risk to bruise or bleed. Call your doctor or health care professional if you notice any unusual bleeding. Be careful brushing and flossing your teeth or using a toothpick because you may get an infection or bleed more easily. If you have any dental work done, tell your dentist you are receiving this medicine. Avoid taking products that contain aspirin, acetaminophen, ibuprofen, naproxen, or ketoprofen unless instructed by your doctor. These medicines may hide a fever. Call your doctor or health care professional if you get diarrhea or mouth sores. Do not treat yourself. To protect your kidneys, drink water or other fluids as directed while you are taking this medicine. Men and women must use effective birth control while taking this medicine. You may also need to continue using effective birth control for a time after stopping this medicine. Do not become pregnant while taking this medicine. Tell your doctor right away if you think that you or your partner might be pregnant. There is a potential for serious side effects to an unborn child. Talk to your health care professional or pharmacist for more information. Do not breast-feed an infant while taking this medicine. This medicine may lower sperm counts. What side effects may I notice from receiving this medicine? Side effects that you should report to your doctor or health care professional as soon as possible: -allergic reactions like skin rash, itching or hives, swelling of the face, lips, or tongue -low blood counts - this medicine may decrease the number of white blood cells, red blood cells and platelets. You may be at increased  risk for infections and bleeding. -signs of infection - fever or chills, cough, sore throat, pain or difficulty passing urine -signs of decreased platelets or bleeding - bruising, pinpoint red spots on the skin, black, tarry stools, blood in the urine -signs of decreased red blood cells - unusually weak or tired, fainting spells, lightheadedness -breathing problems, like a dry cough -changes in emotions or moods -chest pain -confusion -diarrhea -high blood pressure -mouth or throat sores or ulcers -pain, swelling, warmth in the leg -pain on swallowing -swelling of the ankles, feet, hands -trouble passing urine or change in the amount of urine -vomiting -yellowing of the eyes or skin Side effects that usually do not require medical attention (report to your doctor or health care professional if they continue or are bothersome): -hair loss -loss of appetite -nausea -stomach upset This list may not describe all possible side effects. Call your doctor for medical advice about side effects. You may report side effects to FDA at 1-800-FDA-1088. Where should I keep my medicine? This drug is given in a hospital or clinic and will not be stored at home. NOTE: This sheet is a summary. It may not cover all possible information. If you have questions about this medicine, talk to your doctor, pharmacist, or health care provider.  2015, [employer]vier/Gold Standard. (2007-12-29 13:24:03)  Carboplatin injection What is this medicine? CARBOPLATIN (KAR boe pla tin) is a chemotherapy drug. It targets fast dividing cells, like  cancer cells, and causes these cells to die. This medicine is used to treat ovarian cancer and many other cancers. This medicine may be used for other purposes; ask your health care provider or pharmacist if you have questions. COMMON BRAND NAME(S): Paraplatin What should I tell my health care provider before I take this medicine? They need to know if you have any of these  conditions: -blood disorders -hearing problems -kidney disease -recent or ongoing radiation therapy -an unusual or allergic reaction to carboplatin, cisplatin, other chemotherapy, other medicines, foods, dyes, or preservatives -pregnant or trying to get pregnant -breast-feeding How should I use this medicine? This drug is usually given as an infusion into a vein. It is administered in a hospital or clinic by a specially trained health care professional. Talk to your pediatrician regarding the use of this medicine in children. Special care may be needed. Overdosage: If you think you have taken too much of this medicine contact a poison control center or emergency room at once. NOTE: This medicine is only for you. Do not share this medicine with others. What if I miss a dose? It is important not to miss a dose. Call your doctor or health care professional if you are unable to keep an appointment. What may interact with this medicine? -medicines for seizures -medicines to increase blood counts like filgrastim, pegfilgrastim, sargramostim -some antibiotics like amikacin, gentamicin, neomycin, streptomycin, tobramycin -vaccines Talk to your doctor or health care professional before taking any of these medicines: -acetaminophen -aspirin -ibuprofen -ketoprofen -naproxen This list may not describe all possible interactions. Give your health care provider a list of all the medicines, herbs, non-prescription drugs, or dietary supplements you use. Also tell them if you smoke, drink alcohol, or use illegal drugs. Some items may interact with your medicine. What should I watch for while using this medicine? Your condition will be monitored carefully while you are receiving this medicine. You will need important blood work done while you are taking this medicine. This drug may make you feel generally unwell. This is not uncommon, as chemotherapy can affect healthy cells as well as cancer cells. Report  any side effects. Continue your course of treatment even though you feel ill unless your doctor tells you to stop. In some cases, you may be given additional medicines to help with side effects. Follow all directions for their use. Call your doctor or health care professional for advice if you get a fever, chills or sore throat, or other symptoms of a cold or flu. Do not treat yourself. This drug decreases your body's ability to fight infections. Try to avoid being around people who are sick. This medicine may increase your risk to bruise or bleed. Call your doctor or health care professional if you notice any unusual bleeding. Be careful brushing and flossing your teeth or using a toothpick because you may get an infection or bleed more easily. If you have any dental work done, tell your dentist you are receiving this medicine. Avoid taking products that contain aspirin, acetaminophen, ibuprofen, naproxen, or ketoprofen unless instructed by your doctor. These medicines may hide a fever. Do not become pregnant while taking this medicine. Women should inform their doctor if they wish to become pregnant or think they might be pregnant. There is a potential for serious side effects to an unborn child. Talk to your health care professional or pharmacist for more information. Do not breast-feed an infant while taking this medicine. What side effects may I notice from  receiving this medicine? Side effects that you should report to your doctor or health care professional as soon as possible: -allergic reactions like skin rash, itching or hives, swelling of the face, lips, or tongue -signs of infection - fever or chills, cough, sore throat, pain or difficulty passing urine -signs of decreased platelets or bleeding - bruising, pinpoint red spots on the skin, black, tarry stools, nosebleeds -signs of decreased red blood cells - unusually weak or tired, fainting spells, lightheadedness -breathing  problems -changes in hearing -changes in vision -chest pain -high blood pressure -low blood counts - This drug may decrease the number of white blood cells, red blood cells and platelets. You may be at increased risk for infections and bleeding. -nausea and vomiting -pain, swelling, redness or irritation at the injection site -pain, tingling, numbness in the hands or feet -problems with balance, talking, walking -trouble passing urine or change in the amount of urine Side effects that usually do not require medical attention (report to your doctor or health care professional if they continue or are bothersome): -hair loss -loss of appetite -metallic taste in the mouth or changes in taste This list may not describe all possible side effects. Call your doctor for medical advice about side effects. You may report side effects to FDA at 1-800-FDA-1088. Where should I keep my medicine? This drug is given in a hospital or clinic and will not be stored at home. NOTE: This sheet is a summary. It may not cover all possible information. If you have questions about this medicine, talk to your doctor, pharmacist, or health care provider.  2015, [employer]vier/Gold Standard. (2007-09-01 14:38:05)

## 2015-03-15 ENCOUNTER — Telehealth: Payer: Self-pay | Admitting: Medical Oncology

## 2015-03-15 NOTE — Telephone Encounter (Signed)
-----   Message from Noreene Larsson, RN sent at 03/14/2015 11:50 AM EDT ----- Regarding: [place] Follow up Pt of Dr. Julien Nordmann. 1st alimta, carboplatin chemo follow up

## 2015-03-15 NOTE — Telephone Encounter (Signed)
I left message on home phone to call tomorrow and let me know how he is doing.

## 2015-03-16 ENCOUNTER — Telehealth: Payer: Self-pay | Admitting: Medical Oncology

## 2015-03-16 NOTE — Telephone Encounter (Signed)
Pt stated he tolerated chemo very well. However, he feels like he is beginning to get constipated again . He is concerned he will end up in ED again for this. He is taking miralax 1 cap bid . I recommended he add senokot 1 tab bid and told him Mohamed recommended he contact Dr Earle Gell. I spoke to Dr Durenda Age nurse and she will call pt.

## 2015-03-17 LAB — GUARDANT 360

## 2015-03-21 ENCOUNTER — Telehealth: Payer: Self-pay | Admitting: Internal Medicine

## 2015-03-21 ENCOUNTER — Encounter: Payer: Self-pay | Admitting: Internal Medicine

## 2015-03-21 ENCOUNTER — Ambulatory Visit (INDEPENDENT_AMBULATORY_CARE_PROVIDER_SITE_OTHER): Payer: Medicare Other | Admitting: Thoracic Surgery (Cardiothoracic Vascular Surgery)

## 2015-03-21 ENCOUNTER — Ambulatory Visit (HOSPITAL_BASED_OUTPATIENT_CLINIC_OR_DEPARTMENT_OTHER): Payer: Medicare Other | Admitting: Internal Medicine

## 2015-03-21 ENCOUNTER — Ambulatory Visit
Admission: RE | Admit: 2015-03-21 | Discharge: 2015-03-21 | Disposition: A | Discharge status: Home / Self Care | Payer: Medicare Other | Source: Ambulatory Visit | Attending: Thoracic Surgery (Cardiothoracic Vascular Surgery) | Admitting: Thoracic Surgery (Cardiothoracic Vascular Surgery)

## 2015-03-21 ENCOUNTER — Other Ambulatory Visit (HOSPITAL_BASED_OUTPATIENT_CLINIC_OR_DEPARTMENT_OTHER): Payer: Medicare Other

## 2015-03-21 ENCOUNTER — Encounter: Payer: Self-pay | Admitting: Thoracic Surgery (Cardiothoracic Vascular Surgery)

## 2015-03-21 VITALS — BP 142/97 | HR 75 | Resp 16 | Ht 68.0 in | Wt 179.0 lb

## 2015-03-21 VITALS — BP 114/69 | HR 79 | Temp 97.6°F | Resp 19 | Ht 68.0 in | Wt 179.6 lb

## 2015-03-21 DIAGNOSIS — C3490 Malignant neoplasm of unspecified part of unspecified bronchus or lung: Secondary | ICD-10-CM

## 2015-03-21 DIAGNOSIS — J90 Pleural effusion, not elsewhere classified: Secondary | ICD-10-CM

## 2015-03-21 DIAGNOSIS — C7972 Secondary malignant neoplasm of left adrenal gland: Secondary | ICD-10-CM

## 2015-03-21 DIAGNOSIS — E039 Hypothyroidism, unspecified: Secondary | ICD-10-CM

## 2015-03-21 DIAGNOSIS — C772 Secondary and unspecified malignant neoplasm of intra-abdominal lymph nodes: Secondary | ICD-10-CM

## 2015-03-21 DIAGNOSIS — C782 Secondary malignant neoplasm of pleura: Secondary | ICD-10-CM | POA: Diagnosis not present

## 2015-03-21 DIAGNOSIS — C3411 Malignant neoplasm of upper lobe, right bronchus or lung: Secondary | ICD-10-CM

## 2015-03-21 DIAGNOSIS — C3491 Malignant neoplasm of unspecified part of right bronchus or lung: Secondary | ICD-10-CM

## 2015-03-21 DIAGNOSIS — C7951 Secondary malignant neoplasm of bone: Secondary | ICD-10-CM

## 2015-03-21 DIAGNOSIS — J948 Other specified pleural conditions: Secondary | ICD-10-CM

## 2015-03-21 LAB — COMPREHENSIVE METABOLIC PANEL (CC13)
ALBUMIN: 2.7 g/dL — AB (ref 3.5–5.0)
ALK PHOS: 78 U/L (ref 40–150)
ALT: <9 U/L (ref 0–55)
AST: 11 U/L (ref 5–34)
Anion Gap: 7 mEq/L (ref 3–11)
BILIRUBIN TOTAL: 0.51 mg/dL (ref 0.20–1.20)
BUN: 18 mg/dL (ref 7.0–26.0)
CALCIUM: 9.5 mg/dL (ref 8.4–10.4)
CO2: 30 mEq/L — ABNORMAL HIGH (ref 22–29)
CREATININE: 0.8 mg/dL (ref 0.7–1.3)
Chloride: 104 mEq/L (ref 98–109)
EGFR: 87 mL/min/{1.73_m2} — ABNORMAL LOW (ref >90)
Glucose: 175 mg/dl — ABNORMAL HIGH (ref 70–140)
POTASSIUM: 4.8 meq/L (ref 3.5–5.1)
Sodium: 141 mEq/L (ref 136–145)
TOTAL PROTEIN: 6.9 g/dL (ref 6.4–8.3)

## 2015-03-21 LAB — CBC WITH DIFFERENTIAL/PLATELET
BASO%: 0 % (ref 0.0–2.0)
BASOS ABS: 0 10*3/uL (ref 0.0–0.1)
EOS ABS: 0 10*3/uL (ref 0.0–0.5)
EOS%: 0 % (ref 0.0–7.0)
HEMATOCRIT: 37.6 % — AB (ref 38.4–49.9)
HEMOGLOBIN: 11.9 g/dL — AB (ref 13.0–17.1)
LYMPH#: 0.6 10*3/uL — AB (ref 0.9–3.3)
LYMPH%: 12.5 % — ABNORMAL LOW (ref 14.0–49.0)
MCH: 29 pg (ref 27.2–33.4)
MCHC: 31.6 g/dL — ABNORMAL LOW (ref 32.0–36.0)
MCV: 91.7 fL (ref 79.3–98.0)
MONO#: 0.1 10*3/uL (ref 0.1–0.9)
MONO%: 1.2 % (ref 0.0–14.0)
NEUT#: 4.4 10*3/uL (ref 1.5–6.5)
NEUT%: 86.3 % — ABNORMAL HIGH (ref 39.0–75.0)
Platelets: 204 10*3/uL (ref 140–400)
RBC: 4.1 10*6/uL — ABNORMAL LOW (ref 4.20–5.82)
RDW: 14.2 % (ref 11.0–14.6)
WBC: 5.1 10*3/uL (ref 4.0–10.3)

## 2015-03-21 NOTE — Progress Notes (Signed)
Sean Cox       [place] [address]             [phone]       HPI:  Sean Cox returns today for follow up after recent pleural catheter placement  He is an 81 year old man who recently was diagnosed with stage IV lung cancer. He had a rapidly recurring right pleural effusion. I placed a Pleurx catheter on 03/01/2015. He's been draining roughly every other day since then. Initially was draining 400-650 mL with each session, but he's noticed a rapid drop-off recently and only drained 100 mL on 03/19/2015 and 50 mL today.  He has not had any pain associated with the catheter. He has been able to breathe better since the catheter was placed. He has started chemotherapy and received his first cycle. He tolerated that well. He recently was seen in the emergency room with severe constipation. He is feeling better overall mouth that has resolved.  Past Medical History  Diagnosis Date  . Depression   . Hypothyroidism   . Hypercholesteremia   . Glaucoma   . Prostate cancer (Sean Cox)   . Ulcerative proctitis ([place])   . DDD (degenerative disc disease), lumbosacral   . Anxiety   . IBS (irritable bowel syndrome)   . Detached retina   . Impaired fasting glucose   . Heart murmur     recently dx'ed with this 4 weeks ago  . Hypertension     no longer on medications  . GERD (gastroesophageal reflux disease)     in his younger years      Current Outpatient Prescriptions  Medication Sig Dispense Refill  . acetaminophen (TYLENOL) 500 MG tablet Take 1,000 mg by mouth every 4 (four) hours as needed for mild pain, moderate pain, fever or headache.    . APRISO 0.375 G 24 hr capsule Take 8 capsules by mouth daily.   11  . atorvastatin (LIPITOR) 20 MG tablet Take 20 mg by mouth daily.   1  . dexamethasone (DECADRON) 4 MG tablet 4 mg po bid the day before, day of and day after chemo. 40 tablet 1  . dorzolamide (TRUSOPT) 2 % ophthalmic solution Place 1 drop into both eyes 3  (three) times daily.  3  . folic acid (FOLVITE) 1 MG tablet Take 1 tablet (1 mg total) by mouth daily. 30 tablet 4  . levothyroxine (SYNTHROID, LEVOTHROID) 25 MCG tablet Take 25 mcg by mouth daily.   1  . magnesium citrate SOLN Take 0.75 Bottles by mouth once.    . naproxen sodium (ANAPROX) 220 MG tablet Take 220 mg by mouth 2 (two) times daily as needed (pain).    [name] PARoxetine (PAXIL) 40 MG tablet TAKE 1 TABLET BY MOUTH DAILY  3  . polyethylene glycol powder (MIRALAX) powder Take 17 g by mouth 2 (two) times daily. 255 g 0  . prochlorperazine (COMPAZINE) 10 MG tablet Take 1 tablet (10 mg total) by mouth every 6 (six) hours as needed for nausea or vomiting. 30 tablet 0  . senna (SENOKOT) 8.6 MG tablet Take 1 tablet by mouth daily as needed for constipation.     . temazepam (RESTORIL) 15 MG capsule Take 15 mg by mouth at bedtime as needed for sleep.    [name] timolol (TIMOPTIC) 0.5 % ophthalmic solution Place 1 drop into both eyes 3 (three) times daily.  3  . traMADol (ULTRAM) 50 MG tablet Take 1-2 tablets (50-100 mg  total) by mouth every 6 (six) hours as needed (pain). 40 tablet 0   No current facility-administered medications for this visit.    Physical Exam BP 142/97 mmHg  Pulse 75  Resp 16  Ht '5\' 8"'$  (1.727 m)  Wt 179 lb (81.194 kg)  BMI 27.22 kg/m2  SpO34 58% 81 year old man in no acute distress Alert and oriented 3 with no focal deficits Slight diminished rest sounds right base, otherwise clear Cardiac regular rate and rhythm with a 2/6 systolic murmur Catheter is dressed, dressing is clean dry and intact  Diagnostic Tests: I personally reviewed his chest x-ray. It shows marked improvement of his right pleural effusion compared to his previous film.  Impression: 81 year old man with stage IV lung cancer who has a recurrent right pleural effusion being managed with a pleural catheter.  1. Stage IV lung cancer has started chemotherapy under the direction of Dr. Julien Nordmann  2. Recurrent  right pleural effusion. Pleural catheter is in place. His effusion is markedly improved. His drainage volumes have dropped off rapidly. I think at this point we can safely go to twice a week drainage. I will see him back in 2 weeks with a repeat chest x-ray to check on his progress.  3. Constipation. Resolved after visit to emergency room.  Plan: Drain pleural catheter twice weekly.  Return in 2 weeks with chest x-ray.  I spent 10 minutes with Mr. Poulson during this visit, greater than 50% was answering questions and counseling.  [name], MD Sean Cox [phone]

## 2015-03-21 NOTE — Telephone Encounter (Signed)
Pt did not want print out....done.Marland KitchenMarland Kitchen

## 2015-03-21 NOTE — Progress Notes (Signed)
[facility] [phone]   [phone]  OFFICE PROGRESS NOTE  [name], MD [place] [address] [place] [address]  DIAGNOSIS: Stage IV (T2b, N2, M1b) non-small cell lung cancer, adenocarcinoma, with negative EGFR mutation, presented with right upper lobe lung mass in addition to pleural based masses, mediastinal lymphadenopathy as well as metastatic disease to the left adrenal, bone and abdominal lymph nodes diagnosed in August 2016.  PRIOR THERAPY: Status post right Pleurx catheter placement under the care of Sean Cox on 03/01/2015  CURRENT THERAPY: Systemic chemotherapy with carboplatin for AUC of 5 and Alimta 500 MG/M2. First cycle on 03/14/2015.  INTERVAL HISTORY: Sean Cox 81 y.o. male returns to the clinic today for follow-up visit. The patient was started on systemic chemotherapy with carbo platinum and Alimta last week and he tolerated the first week of his treatment fairly well. He was seen at the emergency department for chronic constipation and currently on treatment with MiraLAX and Senokot with improvement of his constipation. The patient denied having any significant weight loss or night sweats. He denied having any nausea or vomiting, no fever or chills. He denied having any headache or visual changes. He has repeat CBC and can branch metabolic panel performed earlier today and he is here for evaluation and discussion of his lab results.  MEDICAL HISTORY: Past Medical History  Diagnosis Date  . Depression   . Hypothyroidism   . Hypercholesteremia   . Glaucoma   . Prostate cancer ([facility])   . Ulcerative proctitis ([facility])   . DDD (degenerative disc disease), lumbosacral   . Anxiety   . IBS (irritable bowel syndrome)   . Detached retina   . Impaired fasting glucose   . Heart murmur     recently dx'ed with this 4 weeks ago  . Hypertension     no longer on medications  . GERD (gastroesophageal reflux  disease)     in his younger years    ALLERGIES:  is allergic to codeine; imuran; remicade; simvastatin; and sulfa antibiotics.  MEDICATIONS:  Current Outpatient Prescriptions  Medication Sig Dispense Refill  . acetaminophen (TYLENOL) 500 MG tablet Take 1,000 mg by mouth every 4 (four) hours as needed for mild pain, moderate pain, fever or headache.    . APRISO 0.375 G 24 hr capsule Take 8 capsules by mouth daily.   11  . atorvastatin (LIPITOR) 20 MG tablet Take 20 mg by mouth daily.   1  . dexamethasone (DECADRON) 4 MG tablet 4 mg po bid the day before, day of and day after chemo. 40 tablet 1  . dorzolamide (TRUSOPT) 2 % ophthalmic solution Place 1 drop into both eyes 3 (three) times daily.  3  . folic acid (FOLVITE) 1 MG tablet Take 1 tablet (1 mg total) by mouth daily. 30 tablet 4  . levothyroxine (SYNTHROID, LEVOTHROID) 25 MCG tablet Take 25 mcg by mouth daily.   1  . magnesium citrate SOLN Take 0.75 Bottles by mouth once.    . naproxen sodium (ANAPROX) 220 MG tablet Take 220 mg by mouth 2 (two) times daily as needed (pain).    [name] PARoxetine (PAXIL) 40 MG tablet TAKE 1 TABLET BY MOUTH DAILY  3  . polyethylene glycol powder (MIRALAX) powder Take 17 g by mouth 2 (two) times daily. 255 g 0  . prochlorperazine (COMPAZINE) 10 MG tablet Take 1 tablet (10 mg total) by mouth every 6 (six) hours as needed  for nausea or vomiting. 30 tablet 0  . senna (SENOKOT) 8.6 MG tablet Take 1 tablet by mouth daily as needed for constipation.     . temazepam (RESTORIL) 15 MG capsule Take 15 mg by mouth at bedtime as needed for sleep.    [name] timolol (TIMOPTIC) 0.5 % ophthalmic solution Place 1 drop into both eyes 3 (three) times daily.  3  . traMADol (ULTRAM) 50 MG tablet Take 1-2 tablets (50-100 mg total) by mouth every 6 (six) hours as needed (pain). 40 tablet 0   No current facility-administered medications for this visit.    SURGICAL HISTORY:  Past Surgical History  Procedure Laterality Date  . Colon  surgery    . Prostatectomy      2004- Dr Sean Cox  . Tonsillectomy    . Diverticular stricture removed      1992  . Fatty tumor excision      in chest  . Chest tube insertion Right 03/01/2015    Procedure: INSERTION PLEURAL DRAINAGE CATHETER RIGHT CHEST;  Surgeon: Sean Nakayama, MD;  Location: [facility];  Service: Thoracic;  Laterality: Right;    REVIEW OF SYSTEMS:  A comprehensive review of systems was negative except for: Constitutional: positive for fatigue   PHYSICAL EXAMINATION: General appearance: alert, cooperative, fatigued and no distress Head: Normocephalic, without obvious abnormality, atraumatic Neck: no adenopathy, no JVD, supple, symmetrical, trachea midline and thyroid not enlarged, symmetric, no tenderness/mass/nodules Lymph nodes: Cervical, supraclavicular, and axillary nodes normal. Resp: clear to auscultation bilaterally Back: symmetric, no curvature. ROM normal. No CVA tenderness. Cardio: regular rate and rhythm, S1, S2 normal, no murmur, click, rub or gallop GI: soft, non-tender; bowel sounds normal; no masses,  no organomegaly Extremities: extremities normal, atraumatic, no cyanosis or edema Neurologic: Alert and oriented X 3, normal strength and tone. Normal symmetric reflexes. Normal coordination and gait  ECOG PERFORMANCE STATUS: 1 - Symptomatic but completely ambulatory  Blood pressure 114/69, pulse 79, temperature 97.6 F (36.4 C), temperature source Oral, resp. rate 19, height $RemoveBe'5\' 8"'tXpgcNioD$  (1.727 m), weight 179 lb 9.6 oz (81.466 kg), SpO2 97 %.  LABORATORY DATA: Lab Results  Component Value Date   WBC 5.1 03/21/2015   HGB 11.9* 03/21/2015   HCT 37.6* 03/21/2015   MCV 91.7 03/21/2015   PLT 204 03/21/2015      Chemistry      Component Value Date/Time   NA 141 03/14/2015 0902   NA 140 03/01/2015 0737   K 4.4 03/14/2015 0902   K 4.3 03/01/2015 0737   CL 101 03/01/2015 0737   CO2 27 03/14/2015 0902   CO2 30 03/01/2015 0737   BUN 14.8 03/14/2015 0902     BUN 27* 03/01/2015 0737   CREATININE 0.8 03/14/2015 0902   CREATININE 1.03 03/01/2015 0737      Component Value Date/Time   CALCIUM 9.0 03/14/2015 0902   CALCIUM 9.4 03/01/2015 0737   ALKPHOS 85 03/14/2015 0902   ALKPHOS 86 03/01/2015 0737   AST 12 03/14/2015 0902   AST 15 03/01/2015 0737   ALT <9 03/14/2015 0902   ALT 10* 03/01/2015 0737   BILITOT <0.30 03/14/2015 0902   BILITOT 0.3 03/01/2015 0737       RADIOGRAPHIC STUDIES: Dg Chest 2 View  02/27/2015   CLINICAL DATA:  Adenocarcinoma.  EXAM: CHEST  2 VIEW  COMPARISON:  Chest x-ray 02/16/2015. PET-CT 02/16/2015. Chest CT 02/06/2015.  FINDINGS: Persistent upper lobe mass with right hilar fullness is noted. Recurrent prominent right pleural effusion. Left  lung is stable. Heart size is stable. Interval resolution of right pneumothorax.  IMPRESSION: 1. Recurrent right pleural effusion, moderate-sized. Interval resolution of right pneumothorax. 2. New right upper lobe mass and right hilar fullness is stable.   Electronically Signed   By: Marcello Moores  Register   On: 02/27/2015 11:59   Dg Abd 2 Views  03/10/2015   CLINICAL DATA:  Constipation  EXAM: ABDOMEN - 2 VIEW  COMPARISON:  PET-CT February 16, 2015  FINDINGS: Supine and upright images obtained. There is diffuse stool throughout the colon. There is no bowel dilatation or air-fluid level suggesting obstruction. No free air. There are phleboliths in the pelvis. There is no edema or consolidation in the lung bases. A metastatic focus in the left iliac crest is better seen on CT.  IMPRESSION: Diffuse stool throughout colon consistent with constipation. No obstruction or free air. Metastatic focus in the left iliac crest is better seen on CT.   Electronically Signed   By: Lowella Grip III M.D.   On: 03/10/2015 16:12   Dg C-arm 1-60 Min-no Report  03/01/2015   CLINICAL DATA: surgery   C-ARM 1-60 MINUTES  Fluoroscopy was utilized by the requesting physician.  No radiographic  interpretation.      ASSESSMENT AND PLAN: This is a very pleasant 81 years old white male recently diagnosed with metastatic non-small cell lung cancer, adenocarcinoma with negative EGFR mutation and negative ALK gene translocation.  He is currently undergoing systemic chemotherapy with carboplatin and Alimta status post 1 cycle. He tolerated the first week of his treatment fairly well. I recommended for the patient to continue his current systemic chemotherapy as a scheduled. He will start cycle #2 in 2 weeks. I will see the patient back for follow-up visit at that time. He was advised to call immediately if he has any concerning symptoms in the interval. The patient voices understanding of current disease status and treatment options and is in agreement with the current care plan.  All questions were answered. The patient knows to call the clinic with any problems, questions or concerns. We can certainly see the patient much sooner if necessary.  Disclaimer: This note was dictated with voice recognition software. Similar sounding words can inadvertently be transcribed and may not be corrected upon review.

## 2015-03-28 ENCOUNTER — Other Ambulatory Visit (HOSPITAL_BASED_OUTPATIENT_CLINIC_OR_DEPARTMENT_OTHER): Payer: Medicare Other

## 2015-03-28 DIAGNOSIS — C3491 Malignant neoplasm of unspecified part of right bronchus or lung: Secondary | ICD-10-CM

## 2015-03-28 DIAGNOSIS — C3411 Malignant neoplasm of upper lobe, right bronchus or lung: Secondary | ICD-10-CM

## 2015-03-28 DIAGNOSIS — C7972 Secondary malignant neoplasm of left adrenal gland: Secondary | ICD-10-CM | POA: Diagnosis not present

## 2015-03-28 LAB — CBC WITH DIFFERENTIAL/PLATELET
BASO%: 0.1 % (ref 0.0–2.0)
Basophils Absolute: 0 10*3/uL (ref 0.0–0.1)
EOS%: 0.2 % (ref 0.0–7.0)
Eosinophils Absolute: 0 10*3/uL (ref 0.0–0.5)
HEMATOCRIT: 34 % — AB (ref 38.4–49.9)
HEMOGLOBIN: 11 g/dL — AB (ref 13.0–17.1)
LYMPH#: 0.9 10*3/uL (ref 0.9–3.3)
LYMPH%: 20 % (ref 14.0–49.0)
MCH: 28.9 pg (ref 27.2–33.4)
MCHC: 32.4 g/dL (ref 32.0–36.0)
MCV: 89.1 fL (ref 79.3–98.0)
MONO#: 0.4 10*3/uL (ref 0.1–0.9)
MONO%: 10.1 % (ref 0.0–14.0)
NEUT%: 69.6 % (ref 39.0–75.0)
NEUTROS ABS: 3.1 10*3/uL (ref 1.5–6.5)
Platelets: 227 10*3/uL (ref 140–400)
RBC: 3.81 10*6/uL — ABNORMAL LOW (ref 4.20–5.82)
RDW: 14.3 % (ref 11.0–14.6)
WBC: 4.4 10*3/uL (ref 4.0–10.3)

## 2015-03-28 LAB — COMPREHENSIVE METABOLIC PANEL (CC13)
ALBUMIN: 2.8 g/dL — AB (ref 3.5–5.0)
ALK PHOS: 73 U/L (ref 40–150)
ALT: 18 U/L (ref 0–55)
AST: 12 U/L (ref 5–34)
Anion Gap: 7 mEq/L (ref 3–11)
BUN: 13.3 mg/dL (ref 7.0–26.0)
CALCIUM: 9.3 mg/dL (ref 8.4–10.4)
CHLORIDE: 106 meq/L (ref 98–109)
CO2: 29 mEq/L (ref 22–29)
CREATININE: 0.7 mg/dL (ref 0.7–1.3)
EGFR: 89 mL/min/{1.73_m2} — ABNORMAL LOW (ref >90)
Glucose: 142 mg/dl — ABNORMAL HIGH (ref 70–140)
Potassium: 3.9 mEq/L (ref 3.5–5.1)
Sodium: 142 mEq/L (ref 136–145)
TOTAL PROTEIN: 6.6 g/dL (ref 6.4–8.3)
Total Bilirubin: <0.3 mg/dL (ref 0.20–1.20)

## 2015-04-04 ENCOUNTER — Encounter: Payer: Self-pay | Admitting: Internal Medicine

## 2015-04-04 ENCOUNTER — Telehealth: Payer: Self-pay | Admitting: Internal Medicine

## 2015-04-04 ENCOUNTER — Other Ambulatory Visit (HOSPITAL_BASED_OUTPATIENT_CLINIC_OR_DEPARTMENT_OTHER): Payer: Medicare Other

## 2015-04-04 ENCOUNTER — Encounter: Payer: Medicare Other | Admitting: Thoracic Surgery (Cardiothoracic Vascular Surgery)

## 2015-04-04 ENCOUNTER — Ambulatory Visit (HOSPITAL_BASED_OUTPATIENT_CLINIC_OR_DEPARTMENT_OTHER): Payer: Medicare Other

## 2015-04-04 ENCOUNTER — Ambulatory Visit (HOSPITAL_BASED_OUTPATIENT_CLINIC_OR_DEPARTMENT_OTHER): Payer: Medicare Other | Admitting: Internal Medicine

## 2015-04-04 VITALS — BP 154/98 | HR 95 | Temp 97.5°F | Resp 18 | Ht 68.0 in | Wt 184.4 lb

## 2015-04-04 DIAGNOSIS — C772 Secondary and unspecified malignant neoplasm of intra-abdominal lymph nodes: Secondary | ICD-10-CM

## 2015-04-04 DIAGNOSIS — C7951 Secondary malignant neoplasm of bone: Secondary | ICD-10-CM | POA: Diagnosis not present

## 2015-04-04 DIAGNOSIS — C7972 Secondary malignant neoplasm of left adrenal gland: Secondary | ICD-10-CM

## 2015-04-04 DIAGNOSIS — C3411 Malignant neoplasm of upper lobe, right bronchus or lung: Secondary | ICD-10-CM

## 2015-04-04 DIAGNOSIS — J90 Pleural effusion, not elsewhere classified: Secondary | ICD-10-CM

## 2015-04-04 DIAGNOSIS — Z5111 Encounter for antineoplastic chemotherapy: Secondary | ICD-10-CM

## 2015-04-04 DIAGNOSIS — C3491 Malignant neoplasm of unspecified part of right bronchus or lung: Secondary | ICD-10-CM

## 2015-04-04 LAB — COMPREHENSIVE METABOLIC PANEL (CC13)
ALBUMIN: 3.2 g/dL — AB (ref 3.5–5.0)
ALK PHOS: 88 U/L (ref 40–150)
ALT: 15 U/L (ref 0–55)
AST: 11 U/L (ref 5–34)
Anion Gap: 10 mEq/L (ref 3–11)
BUN: 17.9 mg/dL (ref 7.0–26.0)
CALCIUM: 10 mg/dL (ref 8.4–10.4)
CHLORIDE: 108 meq/L (ref 98–109)
CO2: 26 mEq/L (ref 22–29)
CREATININE: 0.7 mg/dL (ref 0.7–1.3)
EGFR: 87 mL/min/{1.73_m2} — ABNORMAL LOW (ref >90)
Glucose: 167 mg/dl — ABNORMAL HIGH (ref 70–140)
Potassium: 5 mEq/L (ref 3.5–5.1)
Sodium: 143 mEq/L (ref 136–145)
Total Protein: 7.6 g/dL (ref 6.4–8.3)

## 2015-04-04 LAB — CBC WITH DIFFERENTIAL/PLATELET
BASO%: 0.2 % (ref 0.0–2.0)
BASOS ABS: 0 10*3/uL (ref 0.0–0.1)
EOS%: 0 % (ref 0.0–7.0)
Eosinophils Absolute: 0 10*3/uL (ref 0.0–0.5)
HEMATOCRIT: 38.2 % — AB (ref 38.4–49.9)
HEMOGLOBIN: 12.4 g/dL — AB (ref 13.0–17.1)
LYMPH#: 1.1 10*3/uL (ref 0.9–3.3)
LYMPH%: 17.2 % (ref 14.0–49.0)
MCH: 29.3 pg (ref 27.2–33.4)
MCHC: 32.4 g/dL (ref 32.0–36.0)
MCV: 90.5 fL (ref 79.3–98.0)
MONO#: 0.3 10*3/uL (ref 0.1–0.9)
MONO%: 4.5 % (ref 0.0–14.0)
NEUT#: 4.8 10*3/uL (ref 1.5–6.5)
NEUT%: 78.1 % — AB (ref 39.0–75.0)
PLATELETS: 500 10*3/uL — AB (ref 140–400)
RBC: 4.22 10*6/uL (ref 4.20–5.82)
RDW: 15.5 % — AB (ref 11.0–14.6)
WBC: 6.2 10*3/uL (ref 4.0–10.3)

## 2015-04-04 LAB — TECHNOLOGIST REVIEW

## 2015-04-04 MED ORDER — SODIUM CHLORIDE 0.9 % IV SOLN
Freq: Once | INTRAVENOUS | Status: AC
  Administered 2015-04-04: 13:00:00 via INTRAVENOUS

## 2015-04-04 MED ORDER — SODIUM CHLORIDE 0.9 % IV SOLN
Freq: Once | INTRAVENOUS | Status: AC
  Administered 2015-04-04: 13:00:00 via INTRAVENOUS
  Filled 2015-04-04: qty 8

## 2015-04-04 MED ORDER — SODIUM CHLORIDE 0.9 % IV SOLN
479.0000 mg | Freq: Once | INTRAVENOUS | Status: AC
  Administered 2015-04-04: 480 mg via INTRAVENOUS
  Filled 2015-04-04: qty 48

## 2015-04-04 MED ORDER — SODIUM CHLORIDE 0.9 % IV SOLN
500.0000 mg/m2 | Freq: Once | INTRAVENOUS | Status: AC
  Administered 2015-04-04: 1000 mg via INTRAVENOUS
  Filled 2015-04-04: qty 40

## 2015-04-04 NOTE — Telephone Encounter (Signed)
Gave adn printed appt sched and avs for OCT thru [facility]

## 2015-04-04 NOTE — Progress Notes (Signed)
[facility] [phone]   Fax:(336) [phone]  OFFICE PROGRESS NOTE  [name], MD [place] [address] [place] [address]  DIAGNOSIS: Stage IV (T2b, N2, M1b) non-small cell lung cancer, adenocarcinoma, with negative EGFR mutation, presented with right upper lobe lung mass in addition to pleural based masses, mediastinal lymphadenopathy as well as metastatic disease to the left adrenal, bone and abdominal lymph nodes diagnosed in August 2016.  PRIOR THERAPY: Status post right Pleurx catheter placement under the care of [name] on 03/01/2015  CURRENT THERAPY: Systemic chemotherapy with carboplatin for AUC of 5 and Alimta 500 MG/M2. First cycle on 03/14/2015. Status post one cycle.   INTERVAL HISTORY: Sean Cox 81 y.o. male returns to the clinic today for follow-up visit. The patient tolerated the first cycle of his systemic chemotherapy fairly well with no significant adverse effects. He mentioned today that he is feeling much much better compared to a few months ago. There is very minimal drainage of the right Pleurx at this point. The patient has been a little bit intimidating to the staff at the [facility] and he had to call security and he also was seen by the medical director at the cancer center today. He is much better after talking to Dr. Rolanda Jay. The patient denied having any significant weight loss or night sweats. He denied having any nausea or vomiting, no fever or chills. He denied having any headache or visual changes. He is here today to start cycle #2 of his systemic chemotherapy.  MEDICAL HISTORY: Past Medical History  Diagnosis Date  . Depression   . Hypothyroidism   . Hypercholesteremia   . Glaucoma   . Prostate cancer ([facility])   . Ulcerative proctitis ([facility])   . DDD (degenerative disc disease), lumbosacral   . Anxiety   . IBS (irritable bowel syndrome)   . Detached retina   . Impaired fasting glucose   .  Heart murmur     recently dx'ed with this 4 weeks ago  . Hypertension     no longer on medications  . GERD (gastroesophageal reflux disease)     in his younger years    ALLERGIES:  is allergic to codeine; imuran; remicade; simvastatin; and sulfa antibiotics.  MEDICATIONS:  Current Outpatient Prescriptions  Medication Sig Dispense Refill  . acetaminophen (TYLENOL) 500 MG tablet Take 1,000 mg by mouth every 4 (four) hours as needed for mild pain, moderate pain, fever or headache.    . APRISO 0.375 G 24 hr capsule Take 8 capsules by mouth daily.   11  . atorvastatin (LIPITOR) 20 MG tablet Take 20 mg by mouth daily.   1  . dexamethasone (DECADRON) 4 MG tablet 4 mg po bid the day before, day of and day after chemo. 40 tablet 1  . dorzolamide (TRUSOPT) 2 % ophthalmic solution Place 1 drop into both eyes 3 (three) times daily.  3  . folic acid (FOLVITE) 1 MG tablet Take 1 tablet (1 mg total) by mouth daily. 30 tablet 4  . levothyroxine (SYNTHROID, LEVOTHROID) 25 MCG tablet Take 25 mcg by mouth daily.   1  . magnesium citrate SOLN Take 0.75 Bottles by mouth once.    . naproxen sodium (ANAPROX) 220 MG tablet Take 220 mg by mouth 2 (two) times daily as needed (pain).    [name] PARoxetine (PAXIL) 40 MG tablet TAKE 1 TABLET BY MOUTH DAILY  3  . polyethylene glycol powder (MIRALAX)  powder Take 17 g by mouth 2 (two) times daily. 255 g 0  . prochlorperazine (COMPAZINE) 10 MG tablet Take 1 tablet (10 mg total) by mouth every 6 (six) hours as needed for nausea or vomiting. 30 tablet 0  . senna (SENOKOT) 8.6 MG tablet Take 1 tablet by mouth daily as needed for constipation.     . temazepam (RESTORIL) 15 MG capsule Take 15 mg by mouth at bedtime as needed for sleep.    [name] timolol (TIMOPTIC) 0.5 % ophthalmic solution Place 1 drop into both eyes 3 (three) times daily.  3  . traMADol (ULTRAM) 50 MG tablet Take 1-2 tablets (50-100 mg total) by mouth every 6 (six) hours as needed (pain). 40 tablet 0   No current  facility-administered medications for this visit.    SURGICAL HISTORY:  Past Surgical History  Procedure Laterality Date  . Colon surgery    . Prostatectomy      2004- Dr Risa Grill  . Tonsillectomy    . Diverticular stricture removed      1992  . Fatty tumor excision      in chest  . Chest tube insertion Right 03/01/2015    Procedure: INSERTION PLEURAL DRAINAGE CATHETER RIGHT CHEST;  Surgeon: [name], MD;  Location: [facility];  Service: Thoracic;  Laterality: Right;    REVIEW OF SYSTEMS:  A comprehensive review of systems was negative except for: Constitutional: positive for fatigue   PHYSICAL EXAMINATION: General appearance: alert, cooperative, fatigued and no distress Head: Normocephalic, without obvious abnormality, atraumatic Neck: no adenopathy, no JVD, supple, symmetrical, trachea midline and thyroid not enlarged, symmetric, no tenderness/mass/nodules Lymph nodes: Cervical, supraclavicular, and axillary nodes normal. Resp: clear to auscultation bilaterally Back: symmetric, no curvature. ROM normal. No CVA tenderness. Cardio: regular rate and rhythm, S1, S2 normal, no murmur, click, rub or gallop GI: soft, non-tender; bowel sounds normal; no masses,  no organomegaly Extremities: extremities normal, atraumatic, no cyanosis or edema Neurologic: Alert and oriented X 3, normal strength and tone. Normal symmetric reflexes. Normal coordination and gait  ECOG PERFORMANCE STATUS: 1 - Symptomatic but completely ambulatory  Blood pressure 154/98, pulse 95, temperature 97.5 F (36.4 C), temperature source Oral, resp. rate 18, height _0  (1.727 m), weight 184 lb 6.4 oz (83.643 kg), SpO2 99 %.  LABORATORY DATA: Lab Results  Component Value Date   WBC 6.2 04/04/2015   HGB 12.4* 04/04/2015   HCT 38.2* 04/04/2015   MCV 90.5 04/04/2015   PLT 500* 04/04/2015      Chemistry      Component Value Date/Time   NA 142 03/28/2015 1357   NA 140 03/01/2015 0737   K 3.9 03/28/2015  1357   K 4.3 03/01/2015 0737   CL 101 03/01/2015 0737   CO2 29 03/28/2015 1357   CO2 30 03/01/2015 0737   BUN 13.3 03/28/2015 1357   BUN 27* 03/01/2015 0737   CREATININE 0.7 03/28/2015 1357   CREATININE 1.03 03/01/2015 0737      Component Value Date/Time   CALCIUM 9.3 03/28/2015 1357   CALCIUM 9.4 03/01/2015 0737   ALKPHOS 73 03/28/2015 1357   ALKPHOS 86 03/01/2015 0737   AST 12 03/28/2015 1357   AST 15 03/01/2015 0737   ALT 18 03/28/2015 1357   ALT 10* 03/01/2015 0737   BILITOT <0.30 03/28/2015 1357   BILITOT 0.3 03/01/2015 0737       RADIOGRAPHIC STUDIES: Dg Chest 2 View  03/21/2015  CLINICAL DATA:  Adenocarcinoma of low right  lung EXAM: CHEST  2 VIEW COMPARISON:  02/27/2015 FINDINGS: Stable right upper lobe pulmonary mass. Small partial loculated right pleural effusion with a a right basilar chest tube. There is a subtle linear abnormality projecting over the left upper lung concerning for a pneumothorax which is approximately 15% in size. Clear left lung. Stable cardiomediastinal silhouette. No acute osseous abnormality. IMPRESSION: Small partial loculated right pleural effusion with a right basilar chest tube. There is a subtle linear abnormality projecting over the left upper lung concerning for a pneumothorax which is approximately 15% in size. Stable right upper lobe pulmonary mass. These results will be called to the ordering clinician or representative by the Radiologist Assistant, and communication documented in the PACS or zVision Dashboard. Electronically Signed   By: Kathreen Devoid   On: 03/21/2015 14:42   Dg Abd 2 Views  03/10/2015  CLINICAL DATA:  Constipation EXAM: ABDOMEN - 2 VIEW COMPARISON:  PET-CT February 16, 2015 FINDINGS: Supine and upright images obtained. There is diffuse stool throughout the colon. There is no bowel dilatation or air-fluid level suggesting obstruction. No free air. There are phleboliths in the pelvis. There is no edema or consolidation in the  lung bases. A metastatic focus in the left iliac crest is better seen on CT. IMPRESSION: Diffuse stool throughout colon consistent with constipation. No obstruction or free air. Metastatic focus in the left iliac crest is better seen on CT. Electronically Signed   By: Lowella Grip III M.D.   On: 03/10/2015 16:12    ASSESSMENT AND PLAN: This is a very pleasant 81 years old white male recently diagnosed with metastatic non-small cell lung cancer, adenocarcinoma with negative EGFR mutation and negative ALK gene translocation.  He is currently undergoing systemic chemotherapy with carboplatin and Alimta status post 1 cycle. He tolerated the first cycle of his treatment fairly well. I recommended for the patient to continue his current systemic chemotherapy as a scheduled. He will proceed with cycle #2 today as scheduled. I also discussed with the patient placement of a Port-A-Cath but he declined it at this point and he may consider it in the future. He would come back for follow-up visit in 3 weeks for reevaluation before starting cycle #3. He was advised to call immediately if he has any concerning symptoms in the interval. The patient voices understanding of current disease status and treatment options and is in agreement with the current care plan.  All questions were answered. The patient knows to call the clinic with any problems, questions or concerns. We can certainly see the patient much sooner if necessary.  Disclaimer: This note was dictated with voice recognition software. Similar sounding words can inadvertently be transcribed and may not be corrected upon review.

## 2015-04-04 NOTE — Patient Instructions (Signed)
Cancer Center Discharge Instructions for Patients Receiving Chemotherapy  Today you received the following chemotherapy agents alimta/carboplatin   To help prevent nausea and vomiting after your treatment, we encourage you to take your nausea medication as directed If you develop nausea and vomiting that is not controlled by your nausea medication, call the clinic.   BELOW ARE SYMPTOMS THAT SHOULD BE REPORTED IMMEDIATELY:  *FEVER GREATER THAN 100.5 F  *CHILLS WITH OR WITHOUT FEVER  NAUSEA AND VOMITING THAT IS NOT CONTROLLED WITH YOUR NAUSEA MEDICATION  *UNUSUAL SHORTNESS OF BREATH  *UNUSUAL BRUISING OR BLEEDING  TENDERNESS IN MOUTH AND THROAT WITH OR WITHOUT PRESENCE OF ULCERS  *URINARY PROBLEMS  *BOWEL PROBLEMS  UNUSUAL RASH Items with * indicate a potential emergency and should be followed up as soon as possible.  Feel free to call the clinic you have any questions or concerns. The clinic phone number is [phone].  

## 2015-04-07 ENCOUNTER — Telehealth: Payer: Self-pay | Admitting: *Deleted

## 2015-04-07 NOTE — Telephone Encounter (Signed)
Mr. Sean Cox called "asking if symptoms are normal and will go away or do I need to see someone?  I received chemotherapy 04-04-2015.  (D1,C2 of Carboplatin/Alimta)  I am so tired, weak with decreased vision.  I'm eating and drinking well."   This nurse advised chemotherapy makes you feel very fatigued and to eat and drink well.  Denies trouble sleeping.    "I have Glaucoma but my eyes started watering and my vision is decreased.  I can't trust the Internet but it reads this is a side effect of the chemotherapy I received.  Advised he contact his opthalmologist and he ended call.

## 2015-04-10 ENCOUNTER — Telehealth: Payer: Self-pay

## 2015-04-10 NOTE — Telephone Encounter (Signed)
Chemo on Tuesday. By Thursday fatigue and eyes were blurring and watering. Eyes still burring and watering, fatigue not as bad. Eyes not red, not painful. Cannot drive or read. Constantly wiping eyes. Pt receiving carbo/alimta. Tuesday was cycle 2. Labs 11/1. Next MD 11/15. Pt is asking what to do.

## 2015-04-10 NOTE — Telephone Encounter (Signed)
I called back and spoke to wife ( pt not at home). I instructed her to tell pt to use cool compresses over eyes and systane eye drops. She reported that pt told her that  someone told him to use baby shampoo . I told her he should avoid baby shampoo.

## 2015-04-11 ENCOUNTER — Other Ambulatory Visit (HOSPITAL_BASED_OUTPATIENT_CLINIC_OR_DEPARTMENT_OTHER): Payer: Medicare Other

## 2015-04-11 DIAGNOSIS — C3411 Malignant neoplasm of upper lobe, right bronchus or lung: Secondary | ICD-10-CM | POA: Diagnosis not present

## 2015-04-11 DIAGNOSIS — C7951 Secondary malignant neoplasm of bone: Secondary | ICD-10-CM

## 2015-04-11 DIAGNOSIS — C7972 Secondary malignant neoplasm of left adrenal gland: Secondary | ICD-10-CM

## 2015-04-11 DIAGNOSIS — C3491 Malignant neoplasm of unspecified part of right bronchus or lung: Secondary | ICD-10-CM

## 2015-04-11 LAB — COMPREHENSIVE METABOLIC PANEL (CC13)
ALBUMIN: 2.9 g/dL — AB (ref 3.5–5.0)
ALK PHOS: 73 U/L (ref 40–150)
ALT: 11 U/L (ref 0–55)
AST: 11 U/L (ref 5–34)
Anion Gap: 7 mEq/L (ref 3–11)
BUN: 17.3 mg/dL (ref 7.0–26.0)
CO2: 30 mEq/L — ABNORMAL HIGH (ref 22–29)
Calcium: 9.4 mg/dL (ref 8.4–10.4)
Chloride: 105 mEq/L (ref 98–109)
Creatinine: 0.7 mg/dL (ref 0.7–1.3)
EGFR: 87 mL/min/{1.73_m2} — ABNORMAL LOW (ref >90)
GLUCOSE: 123 mg/dL (ref 70–140)
POTASSIUM: 4.3 meq/L (ref 3.5–5.1)
SODIUM: 142 meq/L (ref 136–145)
TOTAL PROTEIN: 6.5 g/dL (ref 6.4–8.3)
Total Bilirubin: 0.38 mg/dL (ref 0.20–1.20)

## 2015-04-11 LAB — CBC WITH DIFFERENTIAL/PLATELET
BASO%: 0.2 % (ref 0.0–2.0)
Basophils Absolute: 0 10*3/uL (ref 0.0–0.1)
EOS%: 1.7 % (ref 0.0–7.0)
Eosinophils Absolute: 0.1 10*3/uL (ref 0.0–0.5)
HCT: 34.2 % — ABNORMAL LOW (ref 38.4–49.9)
HEMOGLOBIN: 10.9 g/dL — AB (ref 13.0–17.1)
LYMPH%: 29.1 % (ref 14.0–49.0)
MCH: 28.7 pg (ref 27.2–33.4)
MCHC: 31.9 g/dL — ABNORMAL LOW (ref 32.0–36.0)
MCV: 90.2 fL (ref 79.3–98.0)
MONO#: 0.2 10*3/uL (ref 0.1–0.9)
MONO%: 4.1 % (ref 0.0–14.0)
NEUT%: 64.9 % (ref 39.0–75.0)
NEUTROS ABS: 2.6 10*3/uL (ref 1.5–6.5)
Platelets: 167 10*3/uL (ref 140–400)
RBC: 3.79 10*6/uL — ABNORMAL LOW (ref 4.20–5.82)
RDW: 15.7 % — AB (ref 11.0–14.6)
WBC: 3.9 10*3/uL — AB (ref 4.0–10.3)
lymph#: 1.1 10*3/uL (ref 0.9–3.3)

## 2015-04-11 NOTE — Telephone Encounter (Signed)
Ardeen Garland, RN at 04/10/2015 3:22 PM     Status: Signed       Expand All Collapse All   I called back and spoke to wife ( pt not at home). I instructed her to tell pt to use cool compresses over eyes and systane eye drops. She reported that pt told her that someone told him to use baby shampoo . I told her he should avoid baby shampoo.             Janace Hoard, RN at 04/10/2015 11:03 AM     Status: Signed       Expand All Collapse All   Chemo on Tuesday. By Thursday fatigue and eyes were blurring and watering. Eyes still burring and watering, fatigue not as bad. Eyes not red, not painful. Cannot drive or read. Constantly wiping eyes. Pt receiving carbo/alimta. Tuesday was cycle 2. Labs 11/1. Next MD 11/15. Pt is asking what to do.

## 2015-04-14 ENCOUNTER — Other Ambulatory Visit: Payer: Self-pay | Admitting: Thoracic Surgery (Cardiothoracic Vascular Surgery)

## 2015-04-14 DIAGNOSIS — I7121 Aneurysm of the ascending aorta, without rupture: Secondary | ICD-10-CM

## 2015-04-14 DIAGNOSIS — I712 Thoracic aortic aneurysm, without rupture: Secondary | ICD-10-CM

## 2015-04-17 NOTE — Telephone Encounter (Signed)
Called patient providing the following addendum to today's telephone encounter.  Yes. This could be related to his chemo. It may get better as the chemo effect subsides. Continue Eye drops I Strongly recommend for him to see his ophthalmologist to see if he has other recommendations.   Advised he consult with ophthalmologist.  Verbalized he would like the oncology statistics to know what to expect.  Advised all side effects are very individualized.

## 2015-04-17 NOTE — Telephone Encounter (Signed)
"  Three days after my chemotherapy treatment, I began to have blurred vision, eyes watering, can't focus enough to read.  Is this watering eyes going to stop?"   Tried further assessment as I do not know cause to determine if this will stop and what to do about this.  Asked if he's contacted ophthalmologist yet.   "I WAS TOLD TO CALL YOU ALL IF I HAVE ANY PROBLEMS.  I'M HANGING UP NOW.  DO WHAT YOU TOLD ME [facility] DO.  MY OPHTHALMOLOGIST IS CLOSED ON MONDAY.  GET SOMEONE ON THIS PHONE TO CALL ME AT [phone]."  Cycle 2 carboplatin Alimta was received 04-04-2015.

## 2015-04-18 ENCOUNTER — Ambulatory Visit (INDEPENDENT_AMBULATORY_CARE_PROVIDER_SITE_OTHER): Payer: Medicare Other | Admitting: Thoracic Surgery (Cardiothoracic Vascular Surgery)

## 2015-04-18 ENCOUNTER — Encounter: Payer: Self-pay | Admitting: Thoracic Surgery (Cardiothoracic Vascular Surgery)

## 2015-04-18 ENCOUNTER — Telehealth: Payer: Self-pay | Admitting: Internal Medicine

## 2015-04-18 ENCOUNTER — Other Ambulatory Visit: Payer: Self-pay | Admitting: Thoracic Surgery (Cardiothoracic Vascular Surgery)

## 2015-04-18 ENCOUNTER — Other Ambulatory Visit: Payer: Self-pay | Admitting: *Deleted

## 2015-04-18 ENCOUNTER — Ambulatory Visit
Admission: RE | Admit: 2015-04-18 | Discharge: 2015-04-18 | Disposition: A | Discharge status: Home / Self Care | Payer: Medicare Other | Source: Ambulatory Visit | Attending: Thoracic Surgery (Cardiothoracic Vascular Surgery) | Admitting: Thoracic Surgery (Cardiothoracic Vascular Surgery)

## 2015-04-18 ENCOUNTER — Other Ambulatory Visit (HOSPITAL_BASED_OUTPATIENT_CLINIC_OR_DEPARTMENT_OTHER): Payer: Medicare Other

## 2015-04-18 VITALS — BP 144/86 | HR 75 | Resp 18 | Ht 68.0 in | Wt 184.0 lb

## 2015-04-18 DIAGNOSIS — J90 Pleural effusion, not elsewhere classified: Secondary | ICD-10-CM

## 2015-04-18 DIAGNOSIS — C3431 Malignant neoplasm of lower lobe, right bronchus or lung: Secondary | ICD-10-CM

## 2015-04-18 DIAGNOSIS — C3491 Malignant neoplasm of unspecified part of right bronchus or lung: Secondary | ICD-10-CM

## 2015-04-18 DIAGNOSIS — I7121 Aneurysm of the ascending aorta, without rupture: Secondary | ICD-10-CM

## 2015-04-18 DIAGNOSIS — J948 Other specified pleural conditions: Secondary | ICD-10-CM | POA: Diagnosis not present

## 2015-04-18 DIAGNOSIS — I712 Thoracic aortic aneurysm, without rupture: Secondary | ICD-10-CM

## 2015-04-18 LAB — COMPREHENSIVE METABOLIC PANEL (CC13)
ALBUMIN: 3.1 g/dL — AB (ref 3.5–5.0)
ALK PHOS: 80 U/L (ref 40–150)
ALT: 27 U/L (ref 0–55)
AST: 26 U/L (ref 5–34)
Anion Gap: 7 mEq/L (ref 3–11)
BUN: 15.7 mg/dL (ref 7.0–26.0)
CO2: 28 mEq/L (ref 22–29)
CREATININE: 0.8 mg/dL (ref 0.7–1.3)
Calcium: 9.8 mg/dL (ref 8.4–10.4)
Chloride: 108 mEq/L (ref 98–109)
EGFR: 86 mL/min/{1.73_m2} — AB (ref >90)
GLUCOSE: 138 mg/dL (ref 70–140)
Potassium: 4.2 mEq/L (ref 3.5–5.1)
SODIUM: 143 meq/L (ref 136–145)
TOTAL PROTEIN: 6.8 g/dL (ref 6.4–8.3)

## 2015-04-18 LAB — CBC WITH DIFFERENTIAL/PLATELET
BASO%: 0.2 % (ref 0.0–2.0)
Basophils Absolute: 0 10*3/uL (ref 0.0–0.1)
EOS ABS: 0.2 10*3/uL (ref 0.0–0.5)
EOS%: 3.6 % (ref 0.0–7.0)
HCT: 33.5 % — ABNORMAL LOW (ref 38.4–49.9)
HEMOGLOBIN: 10.7 g/dL — AB (ref 13.0–17.1)
LYMPH%: 24.6 % (ref 14.0–49.0)
MCH: 29 pg (ref 27.2–33.4)
MCHC: 32 g/dL (ref 32.0–36.0)
MCV: 90.7 fL (ref 79.3–98.0)
MONO#: 0.4 10*3/uL (ref 0.1–0.9)
MONO%: 9.6 % (ref 0.0–14.0)
NEUT%: 62 % (ref 39.0–75.0)
NEUTROS ABS: 2.8 10*3/uL (ref 1.5–6.5)
PLATELETS: 147 10*3/uL (ref 140–400)
RBC: 3.7 10*6/uL — ABNORMAL LOW (ref 4.20–5.82)
RDW: 16 % — ABNORMAL HIGH (ref 11.0–14.6)
WBC: 4.5 10*3/uL (ref 4.0–10.3)
lymph#: 1.1 10*3/uL (ref 0.9–3.3)

## 2015-04-18 NOTE — Progress Notes (Signed)
[address]       [place] [address]             [phone]       HPI: Sean Cox returns today for follow-up of his right pleural effusion/pleural catheter.  He is an 81 year old man who recently was diagnosed with stage IV lung cancer. He had a rapidly recurring right pleural effusion. I placed a Pleurx catheter on 03/01/2015. He initially was draining 400-650 mL every other day, but noticed a rapid drop-off prior to his last visit. We went to twice weekly drainage and then to once a week. He still is having less than 50 mL of fluid with each drainage.  He has not had any pain associated with the catheter. He has been able to breathe better since the catheter was placed. He has started chemotherapy. His biggest complaint is his eyes watering. He says he has not been able to get a straight answer as to whether this related to the chemotherapy and if so when it would resolve. Other than that he says he is doing better than he thought he would.  Past Medical History  Diagnosis Date  . Depression   . Hypothyroidism   . Hypercholesteremia   . Glaucoma   . Prostate cancer ([facility])   . Ulcerative proctitis ([facility])   . DDD (degenerative disc disease), lumbosacral   . Anxiety   . IBS (irritable bowel syndrome)   . Detached retina   . Impaired fasting glucose   . Heart murmur     recently dx'ed with this 4 weeks ago  . Hypertension     no longer on medications  . GERD (gastroesophageal reflux disease)     in his younger years      Current Outpatient Prescriptions  Medication Sig Dispense Refill  . acetaminophen (TYLENOL) 500 MG tablet Take 1,000 mg by mouth every 4 (four) hours as needed for mild pain, moderate pain, fever or headache.    . APRISO 0.375 G 24 hr capsule Take 8 capsules by mouth daily.   11  . atorvastatin (LIPITOR) 20 MG tablet Take 20 mg by mouth daily.   1  . dexamethasone (DECADRON) 4 MG tablet 4 mg po bid the day before, day of and day after  chemo. 40 tablet 1  . dorzolamide (TRUSOPT) 2 % ophthalmic solution Place 1 drop into both eyes 3 (three) times daily.  3  . folic acid (FOLVITE) 1 MG tablet Take 1 tablet (1 mg total) by mouth daily. 30 tablet 4  . levothyroxine (SYNTHROID, LEVOTHROID) 25 MCG tablet Take 25 mcg by mouth daily.   1  . magnesium citrate SOLN Take 0.75 Bottles by mouth once.    [name] PARoxetine (PAXIL) 40 MG tablet TAKE 1 TABLET BY MOUTH DAILY  3  . polyethylene glycol powder (MIRALAX) powder Take 17 g by mouth 2 (two) times daily. 255 g 0  . prochlorperazine (COMPAZINE) 10 MG tablet Take 1 tablet (10 mg total) by mouth every 6 (six) hours as needed for nausea or vomiting. 30 tablet 0  . senna (SENOKOT) 8.6 MG tablet Take 1 tablet by mouth daily as needed for constipation.     . temazepam (RESTORIL) 15 MG capsule Take 15 mg by mouth at bedtime as needed for sleep.    [name] timolol (TIMOPTIC) 0.5 % ophthalmic solution Place 1 drop into both eyes 3 (three) times daily.  3  . traMADol (ULTRAM) 50 MG tablet  Take 1-2 tablets (50-100 mg total) by mouth every 6 (six) hours as needed (pain). 40 tablet 0   No current facility-administered medications for this visit.    Physical Exam BP 144/86 mmHg  Pulse 75  Resp 18  Ht '5\' 8"'$  (1.727 m)  Wt 184 lb (83.462 kg)  BMI 27.98 kg/m2  SpO49 52% 81 year old man in no acute distress Alert and oriented 3 with no focal deficits Lungs clear with equal breath sounds bilaterally Catheter site clean dry and intact  Diagnostic Tests: CHEST 2 VIEW  COMPARISON: 03/21/2015. 02/27/2015. PET-CT 02/16/2015.  FINDINGS: Mediastinum and hilar structures stable. Right upper lobe mass is again identified. Right pleural catheter in stable position. Right pleural effusion has improved. Tiny pneumothorax on the right again cannot be excluded. Heart size normal. No acute bony abnormality.  IMPRESSION: 1. Persistent right upper lobe mass lesion. 2. Right pleural catheter again noted in  stable position. Right pleural effusion has diminished. Small right pneumothorax again cannot be excluded .   Electronically Signed  By: Marcello Moores Register  On: 04/18/2015 11:41  I personally reviewed his chest x-ray confirmed the findings as noted above.  Impression: 81 year old man with a malignant right pleural effusion the setting of stage IV lung cancer. He's had a Pleurx catheter in for almost 2 months now. He is having minimal drainage at this point in time.  I discussed our options with him. We could either remove the catheter here in the office, or do a talc pleurodesis in short stay and remove the catheter there. I do think doing a talc pleurodesis would potentially decrease his risk for recurrent effusion. He understands the risks of infection or reaction to talc.  Plan: Talc pleurodesis and pleural catheter removal in short stay  I'll see him back in 4 weeks with a chest x-ray to check on his progress.  [name], MD [facility] [phone]

## 2015-04-18 NOTE — Telephone Encounter (Signed)
Patient called to speak to Dr. Rolanda Jay, Medical Director on 04-17-15.  Amy Jimmye Norman, Executive Assistant informed patient that Dr. Rolanda Jay was out of the office.  Patient demanded someone call him back regarding his watering eyes, and let him know if this is a side effect or not.  I called [name] on 04-17-15 and left a message that I would try to contact him again.  I called again on 04-18-15 at 11:00am and got his voicemail again, and explained that I would try to reach out again, that I am in and out of my office.  [name] called the office and was transferred to me at 2:50pm.  I introduced my self and that I wanted to reach out to him about his concerns.  He stated that this is becoming complicated, immensely and it isn't that complicated.  He told me about the watery, glazing over eyes and wanted to know if this was a side effect of his treatments, it started when he started his treatments.  I informed [name] that I had spoken to Dr. Julien Nordmann about his concerns and it was related to his treatment, and that if the eye drops that were prescribed were not working to go see an Probation officer.  [name] at this time became very upset and began to yell into the phone, he stated that we just needed to tell him that this was a side effect and that statistically this many people get this and it will subside when the poison is out of his body."  I tried to explain to him that everyone responds differently but yes most side effects lessen or subside at completion.  He also stated that we didn't want to say that because we were afraid that we would say something wrong.   [name] was extremely upset at this time and was yelling.  I tried to let him know that I wanted to help him and that he was yelling at me.  He stated at this time "you are not trying to help me that if you can't tell me statically this happens with this treatment you are in the wrong job."  I tried to apologize to him, and stated I am sorry you feel  we are not trying to help you, he then yelled back to me, "think about what you just said", and repeated this statement 3 times, he said " you are apologizing for me", I stated I am not apologizing for you, I am sorry that you don't think I am trying to help you, he then hung up the phone.

## 2015-04-20 ENCOUNTER — Other Ambulatory Visit: Payer: Self-pay | Admitting: *Deleted

## 2015-04-21 ENCOUNTER — Telehealth: Payer: Self-pay | Admitting: *Deleted

## 2015-04-21 NOTE — Telephone Encounter (Signed)
Oncology Nurse Navigator Documentation  Oncology Nurse Navigator Flowsheets 04/21/2015  Navigator Encounter Type Telephone/I received notification from Gabriela Eves that patient needs to speak to a nurse.  I called patient.  He had questions about pleural fluid and what happens with accumulation.  I explained.  He was thankful for the call.   Patient Visit Type Follow-up  Treatment Phase Treatment  Barriers/Navigation Needs Education  Education Other  Interventions Education Method  Coordination of Care -  Education Method Verbal  Specialty Items/DME -  Time Spent with Patient 15

## 2015-04-25 ENCOUNTER — Telehealth: Payer: Self-pay | Admitting: Internal Medicine

## 2015-04-25 ENCOUNTER — Other Ambulatory Visit (HOSPITAL_BASED_OUTPATIENT_CLINIC_OR_DEPARTMENT_OTHER): Payer: Medicare Other

## 2015-04-25 ENCOUNTER — Ambulatory Visit (HOSPITAL_BASED_OUTPATIENT_CLINIC_OR_DEPARTMENT_OTHER): Payer: Medicare Other | Admitting: Internal Medicine

## 2015-04-25 ENCOUNTER — Ambulatory Visit (HOSPITAL_BASED_OUTPATIENT_CLINIC_OR_DEPARTMENT_OTHER): Payer: Medicare Other

## 2015-04-25 ENCOUNTER — Encounter: Payer: Self-pay | Admitting: Internal Medicine

## 2015-04-25 VITALS — BP 148/87 | HR 71 | Temp 97.6°F | Resp 18 | Ht 68.0 in | Wt 188.9 lb

## 2015-04-25 DIAGNOSIS — Z5111 Encounter for antineoplastic chemotherapy: Secondary | ICD-10-CM

## 2015-04-25 DIAGNOSIS — C772 Secondary and unspecified malignant neoplasm of intra-abdominal lymph nodes: Secondary | ICD-10-CM

## 2015-04-25 DIAGNOSIS — C7951 Secondary malignant neoplasm of bone: Secondary | ICD-10-CM

## 2015-04-25 DIAGNOSIS — C3411 Malignant neoplasm of upper lobe, right bronchus or lung: Secondary | ICD-10-CM

## 2015-04-25 DIAGNOSIS — C7972 Secondary malignant neoplasm of left adrenal gland: Secondary | ICD-10-CM

## 2015-04-25 DIAGNOSIS — C3491 Malignant neoplasm of unspecified part of right bronchus or lung: Secondary | ICD-10-CM

## 2015-04-25 LAB — CBC WITH DIFFERENTIAL/PLATELET
BASO%: 0 % (ref 0.0–2.0)
BASOS ABS: 0 10*3/uL (ref 0.0–0.1)
EOS ABS: 0 10*3/uL (ref 0.0–0.5)
EOS%: 0 % (ref 0.0–7.0)
HCT: 36.4 % — ABNORMAL LOW (ref 38.4–49.9)
HGB: 11.5 g/dL — ABNORMAL LOW (ref 13.0–17.1)
LYMPH%: 23.8 % (ref 14.0–49.0)
MCH: 29.3 pg (ref 27.2–33.4)
MCHC: 31.6 g/dL — AB (ref 32.0–36.0)
MCV: 92.9 fL (ref 79.3–98.0)
MONO#: 0.7 10*3/uL (ref 0.1–0.9)
MONO%: 13 % (ref 0.0–14.0)
NEUT#: 3.2 10*3/uL (ref 1.5–6.5)
NEUT%: 63.2 % (ref 39.0–75.0)
Platelets: 358 10*3/uL (ref 140–400)
RBC: 3.92 10*6/uL — AB (ref 4.20–5.82)
RDW: 17.1 % — ABNORMAL HIGH (ref 11.0–14.6)
WBC: 5 10*3/uL (ref 4.0–10.3)
lymph#: 1.2 10*3/uL (ref 0.9–3.3)

## 2015-04-25 LAB — COMPREHENSIVE METABOLIC PANEL (CC13)
ALK PHOS: 88 U/L (ref 40–150)
ALT: 14 U/L (ref 0–55)
AST: 12 U/L (ref 5–34)
Albumin: 3.4 g/dL — ABNORMAL LOW (ref 3.5–5.0)
Anion Gap: 11 mEq/L (ref 3–11)
BUN: 17.9 mg/dL (ref 7.0–26.0)
CHLORIDE: 106 meq/L (ref 98–109)
CO2: 27 meq/L (ref 22–29)
Calcium: 9.8 mg/dL (ref 8.4–10.4)
Creatinine: 0.8 mg/dL (ref 0.7–1.3)
EGFR: 85 mL/min/{1.73_m2} — AB (ref >90)
GLUCOSE: 142 mg/dL — AB (ref 70–140)
POTASSIUM: 4.3 meq/L (ref 3.5–5.1)
SODIUM: 143 meq/L (ref 136–145)
Total Protein: 7.1 g/dL (ref 6.4–8.3)

## 2015-04-25 MED ORDER — SODIUM CHLORIDE 0.9 % IV SOLN
500.0000 mg/m2 | Freq: Once | INTRAVENOUS | Status: AC
  Administered 2015-04-25: 1000 mg via INTRAVENOUS
  Filled 2015-04-25: qty 40

## 2015-04-25 MED ORDER — CYANOCOBALAMIN 1000 MCG/ML IJ SOLN
INTRAMUSCULAR | Status: AC
  Filled 2015-04-25: qty 1

## 2015-04-25 MED ORDER — CYANOCOBALAMIN 1000 MCG/ML IJ SOLN
1000.0000 ug | Freq: Once | INTRAMUSCULAR | Status: AC
  Administered 2015-04-25: 1000 ug via INTRAMUSCULAR

## 2015-04-25 MED ORDER — SODIUM CHLORIDE 0.9 % IV SOLN
Freq: Once | INTRAVENOUS | Status: AC
  Administered 2015-04-25: 13:00:00 via INTRAVENOUS

## 2015-04-25 MED ORDER — TALC 5 G PL SUSR
3.0000 g | Freq: Once | INTRAVENOUS | Status: DC
  Filled 2015-04-25: qty 3

## 2015-04-25 MED ORDER — SODIUM CHLORIDE 0.9 % IV SOLN
Freq: Once | INTRAVENOUS | Status: AC
  Administered 2015-04-25: 13:00:00 via INTRAVENOUS
  Filled 2015-04-25: qty 8

## 2015-04-25 MED ORDER — SODIUM CHLORIDE 0.9 % IV SOLN
479.0000 mg | Freq: Once | INTRAVENOUS | Status: AC
  Administered 2015-04-25: 480 mg via INTRAVENOUS
  Filled 2015-04-25: qty 48

## 2015-04-25 NOTE — Patient Instructions (Signed)
[facility] Discharge Instructions for Patients Receiving Chemotherapy  Today you received the following chemotherapy agents: Alimta and Carboplatin.  To help prevent nausea and vomiting after your treatment, we encourage you to take your nausea medication: Compazine. Take one every 6 hours as needed.   If you develop nausea and vomiting that is not controlled by your nausea medication, call the clinic.   BELOW ARE SYMPTOMS THAT SHOULD BE REPORTED IMMEDIATELY:  *FEVER GREATER THAN 100.5 F  *CHILLS WITH OR WITHOUT FEVER  NAUSEA AND VOMITING THAT IS NOT CONTROLLED WITH YOUR NAUSEA MEDICATION  *UNUSUAL SHORTNESS OF BREATH  *UNUSUAL BRUISING OR BLEEDING  TENDERNESS IN MOUTH AND THROAT WITH OR WITHOUT PRESENCE OF ULCERS  *URINARY PROBLEMS  *BOWEL PROBLEMS  UNUSUAL RASH Items with * indicate a potential emergency and should be followed up as soon as possible.  Feel free to call the clinic should you have any questions or concerns. The clinic phone number is [phone].  Please show the [facility] at check-in to the Emergency Department and triage nurse.

## 2015-04-25 NOTE — Telephone Encounter (Signed)
Gave and printed appt sched and avs for pt for NOV and DEC....gv barium

## 2015-04-25 NOTE — Progress Notes (Signed)
[facility] Telephone:([phone]) [phone]   [phone]  OFFICE PROGRESS NOTE  [name], MD [place] [address] [place] [address]  DIAGNOSIS: Stage IV (T2b, N2, M1b) non-small cell lung cancer, adenocarcinoma, with negative EGFR mutation, presented with right upper lobe lung mass in addition to pleural based masses, mediastinal lymphadenopathy as well as metastatic disease to the left adrenal, bone and abdominal lymph nodes diagnosed in August 2016.  PRIOR THERAPY: Status post right Pleurx catheter placement under the care of [name] on 03/01/2015  CURRENT THERAPY: Systemic chemotherapy with carboplatin for AUC of 5 and Alimta 500 MG/M2. First cycle on 03/14/2015. Status post 2 cycles.   INTERVAL HISTORY: Sean Cox 81 y.o. male returns to the clinic today for follow-up visit. The patient tolerated the second cycle of his systemic chemotherapy fairly well with no significant adverse effects except for some burning and increased tears in his eyes. He was advised to use stasis eyedrops with minimal improvement. He was seen by his ophthalmologist who gave him the same recommendation. He otherwise denied having any specific complaints with his treatment. The patient denied having any significant weight loss or night sweats. He denied having any nausea or vomiting, no fever or chills. He denied having any headache or visual changes. He is here today to start cycle #3 of his systemic chemotherapy.  MEDICAL HISTORY: Past Medical History  Diagnosis Date  . Depression   . Hypothyroidism   . Hypercholesteremia   . Glaucoma   . Prostate cancer ([facility])   . Ulcerative proctitis ([facility])   . DDD (degenerative disc disease), lumbosacral   . Anxiety   . IBS (irritable bowel syndrome)   . Detached retina   . Impaired fasting glucose   . Heart murmur     recently dx'ed with this 4 weeks ago  . Hypertension     no longer on medications  . GERD  (gastroesophageal reflux disease)     in his younger years    ALLERGIES:  is allergic to codeine; imuran; remicade; simvastatin; and sulfa antibiotics.  MEDICATIONS:  Current Outpatient Prescriptions  Medication Sig Dispense Refill  . acetaminophen (TYLENOL) 500 MG tablet Take 1,000 mg by mouth every 4 (four) hours as needed for mild pain, moderate pain, fever or headache.    . APRISO 0.375 G 24 hr capsule Take 8 capsules by mouth daily.   11  . atorvastatin (LIPITOR) 20 MG tablet Take 20 mg by mouth daily.   1  . dexamethasone (DECADRON) 4 MG tablet 4 mg po bid the day before, day of and day after chemo. 40 tablet 1  . dorzolamide (TRUSOPT) 2 % ophthalmic solution Place 1 drop into both eyes 3 (three) times daily.  3  . folic acid (FOLVITE) 1 MG tablet Take 1 tablet (1 mg total) by mouth daily. 30 tablet 4  . levothyroxine (SYNTHROID, LEVOTHROID) 25 MCG tablet Take 25 mcg by mouth daily.   1  . magnesium citrate SOLN Take 0.75 Bottles by mouth once.    [name] PARoxetine (PAXIL) 40 MG tablet TAKE 1 TABLET BY MOUTH DAILY  3  . polyethylene glycol powder (MIRALAX) powder Take 17 g by mouth 2 (two) times daily. 255 g 0  . prochlorperazine (COMPAZINE) 10 MG tablet Take 1 tablet (10 mg total) by mouth every 6 (six) hours as needed for nausea or vomiting. 30 tablet 0  . senna (SENOKOT) 8.6 MG tablet Take 1 tablet by  mouth daily as needed for constipation.     . temazepam (RESTORIL) 15 MG capsule Take 15 mg by mouth at bedtime as needed for sleep.    [name] timolol (TIMOPTIC) 0.5 % ophthalmic solution Place 1 drop into both eyes 3 (three) times daily.  3  . traMADol (ULTRAM) 50 MG tablet Take 1-2 tablets (50-100 mg total) by mouth every 6 (six) hours as needed (pain). 40 tablet 0   No current facility-administered medications for this visit.    SURGICAL HISTORY:  Past Surgical History  Procedure Laterality Date  . Colon surgery    . Prostatectomy      2004- Dr Risa Grill  . Tonsillectomy    .  Diverticular stricture removed      1992  . Fatty tumor excision      in chest  . Chest tube insertion Right 03/01/2015    Procedure: INSERTION PLEURAL DRAINAGE CATHETER RIGHT CHEST;  Surgeon: [name], MD;  Location: [place] [facility];  Service: Thoracic;  Laterality: Right;    REVIEW OF SYSTEMS:  A comprehensive review of systems was negative except for: Constitutional: positive for fatigue Eyes: positive for irritation and tears.   PHYSICAL EXAMINATION: General appearance: alert, cooperative, fatigued and no distress Head: Normocephalic, without obvious abnormality, atraumatic Neck: no adenopathy, no JVD, supple, symmetrical, trachea midline and thyroid not enlarged, symmetric, no tenderness/mass/nodules Lymph nodes: Cervical, supraclavicular, and axillary nodes normal. Resp: clear to auscultation bilaterally Back: symmetric, no curvature. ROM normal. No CVA tenderness. Cardio: regular rate and rhythm, S1, S2 normal, no murmur, click, rub or gallop GI: soft, non-tender; bowel sounds normal; no masses,  no organomegaly Extremities: extremities normal, atraumatic, no cyanosis or edema Neurologic: Alert and oriented X 3, normal strength and tone. Normal symmetric reflexes. Normal coordination and gait  ECOG PERFORMANCE STATUS: 1 - Symptomatic but completely ambulatory  Blood pressure 148/87, pulse 71, temperature 97.6 F (36.4 C), temperature source Oral, resp. rate 18, height _0  (1.727 m), weight 188 lb 14.4 oz (85.684 kg), SpO2 96 %.  LABORATORY DATA: Lab Results  Component Value Date   WBC 5.0 04/25/2015   HGB 11.5* 04/25/2015   HCT 36.4* 04/25/2015   MCV 92.9 04/25/2015   PLT 358 04/25/2015      Chemistry      Component Value Date/Time   NA 143 04/25/2015 1044   NA 140 03/01/2015 0737   K 4.3 04/25/2015 1044   K 4.3 03/01/2015 0737   CL 101 03/01/2015 0737   CO2 27 04/25/2015 1044   CO2 30 03/01/2015 0737   BUN 17.9 04/25/2015 1044   BUN 27* 03/01/2015 0737    CREATININE 0.8 04/25/2015 1044   CREATININE 1.03 03/01/2015 0737      Component Value Date/Time   CALCIUM 9.8 04/25/2015 1044   CALCIUM 9.4 03/01/2015 0737   ALKPHOS 88 04/25/2015 1044   ALKPHOS 86 03/01/2015 0737   AST 12 04/25/2015 1044   AST 15 03/01/2015 0737   ALT 14 04/25/2015 1044   ALT 10* 03/01/2015 0737   BILITOT <0.30 04/25/2015 1044   BILITOT 0.3 03/01/2015 0737       RADIOGRAPHIC STUDIES: Dg Chest 2 View  04/18/2015  CLINICAL DATA:  Lung cancer. EXAM: CHEST  2 VIEW COMPARISON:  03/21/2015.  02/27/2015.  PET-CT 02/16/2015. FINDINGS: Mediastinum and hilar structures stable. Right upper lobe mass is again identified. Right pleural catheter in stable position. Right pleural effusion has improved. Tiny pneumothorax on the right again cannot be excluded. Heart size  normal. No acute bony abnormality. IMPRESSION: 1. Persistent right upper lobe mass lesion. 2. Right pleural catheter again noted in stable position. Right pleural effusion has diminished. Small right pneumothorax again cannot be excluded . Electronically Signed   By: Marcello Moores  Register   On: 04/18/2015 11:41   ASSESSMENT AND PLAN: This is a very pleasant 81 years old white male recently diagnosed with metastatic non-small cell lung cancer, adenocarcinoma with negative EGFR mutation and negative ALK gene translocation.  He is currently undergoing systemic chemotherapy with carboplatin and Alimta status post 2 cycles.  He continues to tolerate his treatment well except for the burning sensation and tears in his eyes. Unfortunately this could be one of the rare side effect of this treatment but the patient is willing to continue treatment with this side effect. He will continue to use the eyedrops as needed and he is currently try to avoid driving at nighttime. I recommended for the patient to continue his current systemic chemotherapy as a scheduled. He will proceed with cycle #3 today as scheduled. He would come back for  follow-up visit in 3 weeks for reevaluation before starting cycle #3 after repeating CT scan of the chest, abdomen and pelvis for restaging of his disease. He was advised to call immediately if he has any concerning symptoms in the interval. The patient voices understanding of current disease status and treatment options and is in agreement with the current care plan.  All questions were answered. The patient knows to call the clinic with any problems, questions or concerns. We can certainly see the patient much sooner if necessary.  Disclaimer: This note was dictated with voice recognition software. Similar sounding words can inadvertently be transcribed and may not be corrected upon review.

## 2015-04-26 ENCOUNTER — Encounter (HOSPITAL_COMMUNITY)
Admission: RE | Disposition: A | Discharge status: Home / Self Care | Payer: Self-pay | Source: Ambulatory Visit | Attending: Thoracic Surgery (Cardiothoracic Vascular Surgery)

## 2015-04-26 ENCOUNTER — Ambulatory Visit (HOSPITAL_COMMUNITY)
Admission: RE | Admit: 2015-04-26 | Discharge: 2015-04-26 | Disposition: A | Discharge status: Home / Self Care | Payer: Medicare Other | Source: Ambulatory Visit | Attending: Thoracic Surgery (Cardiothoracic Vascular Surgery) | Admitting: Thoracic Surgery (Cardiothoracic Vascular Surgery)

## 2015-04-26 DIAGNOSIS — J90 Pleural effusion, not elsewhere classified: Secondary | ICD-10-CM | POA: Diagnosis not present

## 2015-04-26 DIAGNOSIS — E039 Hypothyroidism, unspecified: Secondary | ICD-10-CM | POA: Insufficient documentation

## 2015-04-26 DIAGNOSIS — I10 Essential (primary) hypertension: Secondary | ICD-10-CM | POA: Diagnosis not present

## 2015-04-26 DIAGNOSIS — C3490 Malignant neoplasm of unspecified part of unspecified bronchus or lung: Secondary | ICD-10-CM | POA: Insufficient documentation

## 2015-04-26 DIAGNOSIS — Z8546 Personal history of malignant neoplasm of prostate: Secondary | ICD-10-CM | POA: Insufficient documentation

## 2015-04-26 DIAGNOSIS — Z79899 Other long term (current) drug therapy: Secondary | ICD-10-CM | POA: Diagnosis not present

## 2015-04-26 HISTORY — PX: TALC PLEURODESIS: SHX2506

## 2015-04-26 HISTORY — PX: REMOVAL OF PLEURAL DRAINAGE CATHETER: SHX5080

## 2015-04-26 SURGERY — PLEURODESIS, USING TALC
Anesthesia: Monitor Anesthesia Care | Laterality: Right

## 2015-04-26 MED ORDER — TALC 5 G PL SUSR
3.0000 g | INTRAPLEURAL | Status: DC | PRN
  Administered 2015-04-26: 3 g via INTRAPLEURAL

## 2015-04-26 MED ORDER — LIDOCAINE HCL (PF) 2 % IJ SOLN
INTRAMUSCULAR | Status: DC | PRN
  Administered 2015-04-26: 6 mL via INTRADERMAL

## 2015-04-26 MED ORDER — LIDOCAINE HCL (PF) 1 % IJ SOLN
INTRAMUSCULAR | Status: AC
  Filled 2015-04-26: qty 30

## 2015-04-26 NOTE — H&P (View-Only) (Signed)
[address]       [place] [address]             [phone]       HPI: Mr. Sean Cox returns today for follow-up of his right pleural effusion/pleural catheter.  He is an 81 year old man who recently was diagnosed with stage IV lung cancer. He had a rapidly recurring right pleural effusion. I placed a Pleurx catheter on 03/01/2015. He initially was draining 400-650 mL every other day, but noticed a rapid drop-off prior to his last visit. We went to twice weekly drainage and then to once a week. He still is having less than 50 mL of fluid with each drainage.  He has not had any pain associated with the catheter. He has been able to breathe better since the catheter was placed. He has started chemotherapy. His biggest complaint is his eyes watering. He says he has not been able to get a straight answer as to whether this related to the chemotherapy and if so when it would resolve. Other than that he says he is doing better than he thought he would.  Past Medical History  Diagnosis Date  . Depression   . Hypothyroidism   . Hypercholesteremia   . Glaucoma   . Prostate cancer ([facility])   . Ulcerative proctitis ([facility])   . DDD (degenerative disc disease), lumbosacral   . Anxiety   . IBS (irritable bowel syndrome)   . Detached retina   . Impaired fasting glucose   . Heart murmur     recently dx'ed with this 4 weeks ago  . Hypertension     no longer on medications  . GERD (gastroesophageal reflux disease)     in his younger years      Current Outpatient Prescriptions  Medication Sig Dispense Refill  . acetaminophen (TYLENOL) 500 MG tablet Take 1,000 mg by mouth every 4 (four) hours as needed for mild pain, moderate pain, fever or headache.    . APRISO 0.375 G 24 hr capsule Take 8 capsules by mouth daily.   11  . atorvastatin (LIPITOR) 20 MG tablet Take 20 mg by mouth daily.   1  . dexamethasone (DECADRON) 4 MG tablet 4 mg po bid the day before, day of and day after  chemo. 40 tablet 1  . dorzolamide (TRUSOPT) 2 % ophthalmic solution Place 1 drop into both eyes 3 (three) times daily.  3  . folic acid (FOLVITE) 1 MG tablet Take 1 tablet (1 mg total) by mouth daily. 30 tablet 4  . levothyroxine (SYNTHROID, LEVOTHROID) 25 MCG tablet Take 25 mcg by mouth daily.   1  . magnesium citrate SOLN Take 0.75 Bottles by mouth once.    [name] PARoxetine (PAXIL) 40 MG tablet TAKE 1 TABLET BY MOUTH DAILY  3  . polyethylene glycol powder (MIRALAX) powder Take 17 g by mouth 2 (two) times daily. 255 g 0  . prochlorperazine (COMPAZINE) 10 MG tablet Take 1 tablet (10 mg total) by mouth every 6 (six) hours as needed for nausea or vomiting. 30 tablet 0  . senna (SENOKOT) 8.6 MG tablet Take 1 tablet by mouth daily as needed for constipation.     . temazepam (RESTORIL) 15 MG capsule Take 15 mg by mouth at bedtime as needed for sleep.    [name] timolol (TIMOPTIC) 0.5 % ophthalmic solution Place 1 drop into both eyes 3 (three) times daily.  3  . traMADol (ULTRAM) 50 MG tablet  Take 1-2 tablets (50-100 mg total) by mouth every 6 (six) hours as needed (pain). 40 tablet 0   No current facility-administered medications for this visit.    Physical Exam BP 144/86 mmHg  Pulse 75  Resp 18  Ht '5\' 8"'$  (1.727 m)  Wt 184 lb (83.462 kg)  BMI 27.98 kg/m2  SpO84 30% 81 year old man in no acute distress Alert and oriented 3 with no focal deficits Lungs clear with equal breath sounds bilaterally Catheter site clean dry and intact  Diagnostic Tests: CHEST 2 VIEW  COMPARISON: 03/21/2015. 02/27/2015. PET-CT 02/16/2015.  FINDINGS: Mediastinum and hilar structures stable. Right upper lobe mass is again identified. Right pleural catheter in stable position. Right pleural effusion has improved. Tiny pneumothorax on the right again cannot be excluded. Heart size normal. No acute bony abnormality.  IMPRESSION: 1. Persistent right upper lobe mass lesion. 2. Right pleural catheter again noted in  stable position. Right pleural effusion has diminished. Small right pneumothorax again cannot be excluded .   Electronically Signed  By: Marcello Moores Register  On: 04/18/2015 11:41  I personally reviewed his chest x-ray confirmed the findings as noted above.  Impression: 81 year old man with a malignant right pleural effusion the setting of stage IV lung cancer. He's had a Pleurx catheter in for almost 2 months now. He is having minimal drainage at this point in time.  I discussed our options with him. We could either remove the catheter here in the office, or do a talc pleurodesis in short stay and remove the catheter there. I do think doing a talc pleurodesis would potentially decrease his risk for recurrent effusion. He understands the risks of infection or reaction to talc.  Plan: Talc pleurodesis and pleural catheter removal in short stay  I'll see him back in 4 weeks with a chest x-ray to check on his progress.  [name], MD [facility] [phone]

## 2015-04-26 NOTE — Brief Op Note (Signed)
04/26/2015  12:44 PM  PATIENT:  Sean Cox  81 y.o. male  PRE-OPERATIVE DIAGNOSIS:  RIGHT PLEURAL EFFUSION  POST-OPERATIVE DIAGNOSIS:  RIGHT PLEURAL EFFUSION  PROCEDURE:   RIGHT SIDED TALC PLEURODESIS REMOVAL OF RIGHT PLEURX CATHETER  PHYSICIAN ASSISTANT: Suzzanne Cloud, PA-C  ANESTHESIA:   local - 19m 1% Xylocaine    Brief Note:   After a timeout was performed, 3g talc in slurry was instilled through the right PleuRx catheter without difficulty.  The area was then anesthetized with 5 ml of 1% xylocaine, the prepped with Betadine.  Sutures were removed and the cuff was identified and freed with blunt dissection.  The catheter was removed easily with cuff intact. The wound was closed with a single 3-0 interrupted suture.  The patient tolerated the procedure well.

## 2015-04-26 NOTE — Progress Notes (Signed)
Verbal follow up instructions given by Suzzanne Cloud, PA- C. Patient has questions regarding what to watch for if site were to get infected. Discussed to watch for redness and/ or discharge from site and fever. Give patient a card with times of appointments at [facility] written on it. Patient verbalized understanding. Patient left ambulating refused wheelchair. No sedation medication given during visit.

## 2015-04-26 NOTE — Interval H&P Note (Signed)
History and Physical Interval Note:  04/26/2015 11:56 AM  Sean Cox  has presented today for surgery, with the diagnosis of RIGHT PLEURAL EFFUSION  The various methods of treatment have been discussed with the patient and family. After consideration of risks, benefits and other options for treatment, the patient has consented to  Procedure(s): TALC PLEURADESIS (Right) REMOVAL OF PLEURAL DRAINAGE CATHETER (Right) as a surgical intervention .  The patient's history has been reviewed, patient examined, no change in status, stable for surgery.  I have reviewed the patient's chart and labs.  Questions were answered to the patient's satisfaction.     [name]

## 2015-04-27 ENCOUNTER — Encounter (HOSPITAL_COMMUNITY): Payer: Self-pay | Admitting: Thoracic Surgery (Cardiothoracic Vascular Surgery)

## 2015-05-02 ENCOUNTER — Other Ambulatory Visit (HOSPITAL_BASED_OUTPATIENT_CLINIC_OR_DEPARTMENT_OTHER): Payer: Medicare Other

## 2015-05-02 DIAGNOSIS — C3491 Malignant neoplasm of unspecified part of right bronchus or lung: Secondary | ICD-10-CM

## 2015-05-02 LAB — CBC WITH DIFFERENTIAL/PLATELET
BASO%: 0.4 % (ref 0.0–2.0)
BASOS ABS: 0 10*3/uL (ref 0.0–0.1)
EOS ABS: 0.1 10*3/uL (ref 0.0–0.5)
EOS%: 2.2 % (ref 0.0–7.0)
HEMATOCRIT: 35 % — AB (ref 38.4–49.9)
HEMOGLOBIN: 11.2 g/dL — AB (ref 13.0–17.1)
LYMPH#: 1.4 10*3/uL (ref 0.9–3.3)
LYMPH%: 41.6 % (ref 14.0–49.0)
MCH: 29.3 pg (ref 27.2–33.4)
MCHC: 31.9 g/dL — ABNORMAL LOW (ref 32.0–36.0)
MCV: 91.9 fL (ref 79.3–98.0)
MONO#: 0.3 10*3/uL (ref 0.1–0.9)
MONO%: 8.6 % (ref 0.0–14.0)
NEUT%: 47.2 % (ref 39.0–75.0)
NEUTROS ABS: 1.6 10*3/uL (ref 1.5–6.5)
Platelets: 195 10*3/uL (ref 140–400)
RBC: 3.81 10*6/uL — ABNORMAL LOW (ref 4.20–5.82)
RDW: 17.5 % — AB (ref 11.0–14.6)
WBC: 3.3 10*3/uL — AB (ref 4.0–10.3)

## 2015-05-02 LAB — COMPREHENSIVE METABOLIC PANEL (CC13)
ALBUMIN: 3.1 g/dL — AB (ref 3.5–5.0)
ALK PHOS: 79 U/L (ref 40–150)
ALT: 11 U/L (ref 0–55)
AST: 14 U/L (ref 5–34)
Anion Gap: 8 mEq/L (ref 3–11)
BUN: 17.7 mg/dL (ref 7.0–26.0)
CALCIUM: 9.5 mg/dL (ref 8.4–10.4)
CO2: 28 mEq/L (ref 22–29)
Chloride: 106 mEq/L (ref 98–109)
Creatinine: 0.7 mg/dL (ref 0.7–1.3)
EGFR: 87 mL/min/{1.73_m2} — ABNORMAL LOW (ref >90)
GLUCOSE: 147 mg/dL — AB (ref 70–140)
Potassium: 4.4 mEq/L (ref 3.5–5.1)
SODIUM: 142 meq/L (ref 136–145)
TOTAL PROTEIN: 6.6 g/dL (ref 6.4–8.3)
Total Bilirubin: <0.3 mg/dL (ref 0.20–1.20)

## 2015-05-03 ENCOUNTER — Encounter (INDEPENDENT_AMBULATORY_CARE_PROVIDER_SITE_OTHER): Payer: Medicare Other

## 2015-05-03 DIAGNOSIS — C3490 Malignant neoplasm of unspecified part of unspecified bronchus or lung: Secondary | ICD-10-CM | POA: Diagnosis not present

## 2015-05-05 ENCOUNTER — Other Ambulatory Visit: Payer: Self-pay | Admitting: Medical Oncology

## 2015-05-05 DIAGNOSIS — R110 Nausea: Secondary | ICD-10-CM

## 2015-05-05 MED ORDER — PROCHLORPERAZINE MALEATE 10 MG PO TABS: 10.0000 mg | ORAL_TABLET | Freq: Four times a day (QID) | ORAL | Status: DC | PRN

## 2015-05-09 ENCOUNTER — Other Ambulatory Visit (HOSPITAL_BASED_OUTPATIENT_CLINIC_OR_DEPARTMENT_OTHER): Payer: Medicare Other

## 2015-05-09 DIAGNOSIS — C3491 Malignant neoplasm of unspecified part of right bronchus or lung: Secondary | ICD-10-CM

## 2015-05-09 LAB — CBC WITH DIFFERENTIAL/PLATELET
BASO%: 0.2 % (ref 0.0–2.0)
BASOS ABS: 0 10*3/uL (ref 0.0–0.1)
EOS%: 2.1 % (ref 0.0–7.0)
Eosinophils Absolute: 0.1 10*3/uL (ref 0.0–0.5)
HCT: 33.9 % — ABNORMAL LOW (ref 38.4–49.9)
HEMOGLOBIN: 10.9 g/dL — AB (ref 13.0–17.1)
LYMPH#: 1.3 10*3/uL (ref 0.9–3.3)
LYMPH%: 25.9 % (ref 14.0–49.0)
MCH: 29.3 pg (ref 27.2–33.4)
MCHC: 32.2 g/dL (ref 32.0–36.0)
MCV: 91.3 fL (ref 79.3–98.0)
MONO#: 0.5 10*3/uL (ref 0.1–0.9)
MONO%: 10.5 % (ref 0.0–14.0)
NEUT%: 61.3 % (ref 39.0–75.0)
NEUTROS ABS: 3 10*3/uL (ref 1.5–6.5)
Platelets: 114 10*3/uL — ABNORMAL LOW (ref 140–400)
RBC: 3.72 10*6/uL — ABNORMAL LOW (ref 4.20–5.82)
RDW: 18.1 % — AB (ref 11.0–14.6)
WBC: 4.9 10*3/uL (ref 4.0–10.3)

## 2015-05-09 LAB — COMPREHENSIVE METABOLIC PANEL (CC13)
ALBUMIN: 3.3 g/dL — AB (ref 3.5–5.0)
ALK PHOS: 79 U/L (ref 40–150)
ALT: 23 U/L (ref 0–55)
AST: 27 U/L (ref 5–34)
Anion Gap: 8 mEq/L (ref 3–11)
BUN: 12.1 mg/dL (ref 7.0–26.0)
CO2: 30 mEq/L — ABNORMAL HIGH (ref 22–29)
CREATININE: 0.9 mg/dL (ref 0.7–1.3)
Calcium: 9.8 mg/dL (ref 8.4–10.4)
Chloride: 104 mEq/L (ref 98–109)
EGFR: 82 mL/min/{1.73_m2} — ABNORMAL LOW (ref >90)
GLUCOSE: 96 mg/dL (ref 70–140)
POTASSIUM: 4.2 meq/L (ref 3.5–5.1)
SODIUM: 143 meq/L (ref 136–145)
Total Bilirubin: 0.35 mg/dL (ref 0.20–1.20)
Total Protein: 6.9 g/dL (ref 6.4–8.3)

## 2015-05-12 ENCOUNTER — Other Ambulatory Visit: Payer: Self-pay | Admitting: Thoracic Surgery (Cardiothoracic Vascular Surgery)

## 2015-05-12 DIAGNOSIS — C3490 Malignant neoplasm of unspecified part of unspecified bronchus or lung: Secondary | ICD-10-CM

## 2015-05-15 ENCOUNTER — Encounter: Payer: Self-pay | Admitting: Internal Medicine

## 2015-05-15 ENCOUNTER — Ambulatory Visit (HOSPITAL_COMMUNITY)
Admission: RE | Admit: 2015-05-15 | Discharge: 2015-05-15 | Disposition: A | Discharge status: Home / Self Care | Payer: Medicare Other | Source: Ambulatory Visit | Attending: Internal Medicine | Admitting: Internal Medicine

## 2015-05-15 ENCOUNTER — Encounter (HOSPITAL_COMMUNITY): Payer: Self-pay

## 2015-05-15 DIAGNOSIS — R911 Solitary pulmonary nodule: Secondary | ICD-10-CM | POA: Insufficient documentation

## 2015-05-15 DIAGNOSIS — J90 Pleural effusion, not elsewhere classified: Secondary | ICD-10-CM | POA: Insufficient documentation

## 2015-05-15 DIAGNOSIS — M898X0 Other specified disorders of bone, multiple sites: Secondary | ICD-10-CM | POA: Insufficient documentation

## 2015-05-15 DIAGNOSIS — R590 Localized enlarged lymph nodes: Secondary | ICD-10-CM | POA: Insufficient documentation

## 2015-05-15 DIAGNOSIS — C3491 Malignant neoplasm of unspecified part of right bronchus or lung: Secondary | ICD-10-CM | POA: Insufficient documentation

## 2015-05-15 MED ORDER — IOHEXOL 300 MG/ML  SOLN
100.0000 mL | Freq: Once | INTRAMUSCULAR | Status: AC | PRN
  Administered 2015-05-15: 100 mL via INTRAVENOUS

## 2015-05-15 NOTE — Progress Notes (Signed)
I received a phone call from Dr. Rolanda Jay, the medical director at the [facility] asking me to call Sean Cox to address some of his concerns regarding the chemotherapy and the persistent burning sensation and years and his eye after treatment. This has been addressed with the patient on the previous visit. I called Sean Cox at [phone] but no answer. I left him a message to call back the [facility] today if needed but the patient is also coming tomorrow for his routine follow-up visit and evaluation before the next cycle of his chemotherapy. I definitely can address these issues with him face-to-face during the visit.

## 2015-05-16 ENCOUNTER — Ambulatory Visit (INDEPENDENT_AMBULATORY_CARE_PROVIDER_SITE_OTHER): Payer: Medicare Other | Admitting: Thoracic Surgery (Cardiothoracic Vascular Surgery)

## 2015-05-16 ENCOUNTER — Ambulatory Visit (HOSPITAL_BASED_OUTPATIENT_CLINIC_OR_DEPARTMENT_OTHER): Payer: Medicare Other | Admitting: Internal Medicine

## 2015-05-16 ENCOUNTER — Encounter: Payer: Self-pay | Admitting: Internal Medicine

## 2015-05-16 ENCOUNTER — Ambulatory Visit (HOSPITAL_BASED_OUTPATIENT_CLINIC_OR_DEPARTMENT_OTHER): Payer: Medicare Other

## 2015-05-16 ENCOUNTER — Encounter: Payer: Self-pay | Admitting: Thoracic Surgery (Cardiothoracic Vascular Surgery)

## 2015-05-16 ENCOUNTER — Other Ambulatory Visit (HOSPITAL_BASED_OUTPATIENT_CLINIC_OR_DEPARTMENT_OTHER): Payer: Medicare Other

## 2015-05-16 ENCOUNTER — Ambulatory Visit
Admission: RE | Admit: 2015-05-16 | Discharge: 2015-05-16 | Disposition: A | Discharge status: Home / Self Care | Payer: Medicare Other | Source: Ambulatory Visit | Attending: Thoracic Surgery (Cardiothoracic Vascular Surgery) | Admitting: Thoracic Surgery (Cardiothoracic Vascular Surgery)

## 2015-05-16 ENCOUNTER — Encounter: Payer: Self-pay | Admitting: *Deleted

## 2015-05-16 VITALS — BP 150/94 | HR 66 | Resp 20 | Ht 68.0 in | Wt 186.0 lb

## 2015-05-16 VITALS — BP 146/79 | HR 68 | Temp 97.7°F | Resp 17 | Ht 68.0 in | Wt 186.7 lb

## 2015-05-16 DIAGNOSIS — C7972 Secondary malignant neoplasm of left adrenal gland: Secondary | ICD-10-CM

## 2015-05-16 DIAGNOSIS — J948 Other specified pleural conditions: Secondary | ICD-10-CM | POA: Diagnosis not present

## 2015-05-16 DIAGNOSIS — C772 Secondary and unspecified malignant neoplasm of intra-abdominal lymph nodes: Secondary | ICD-10-CM | POA: Diagnosis not present

## 2015-05-16 DIAGNOSIS — C7951 Secondary malignant neoplasm of bone: Secondary | ICD-10-CM

## 2015-05-16 DIAGNOSIS — C3411 Malignant neoplasm of upper lobe, right bronchus or lung: Secondary | ICD-10-CM

## 2015-05-16 DIAGNOSIS — C3490 Malignant neoplasm of unspecified part of unspecified bronchus or lung: Secondary | ICD-10-CM

## 2015-05-16 DIAGNOSIS — C3491 Malignant neoplasm of unspecified part of right bronchus or lung: Secondary | ICD-10-CM | POA: Diagnosis not present

## 2015-05-16 DIAGNOSIS — Z5111 Encounter for antineoplastic chemotherapy: Secondary | ICD-10-CM

## 2015-05-16 DIAGNOSIS — H04203 Unspecified epiphora, bilateral lacrimal glands: Secondary | ICD-10-CM

## 2015-05-16 DIAGNOSIS — Z9889 Other specified postprocedural states: Secondary | ICD-10-CM | POA: Diagnosis not present

## 2015-05-16 DIAGNOSIS — J90 Pleural effusion, not elsewhere classified: Secondary | ICD-10-CM

## 2015-05-16 DIAGNOSIS — H04209 Unspecified epiphora, unspecified lacrimal gland: Secondary | ICD-10-CM | POA: Insufficient documentation

## 2015-05-16 LAB — CBC WITH DIFFERENTIAL/PLATELET
BASO%: 0.1 % (ref 0.0–2.0)
BASOS ABS: 0 10*3/uL (ref 0.0–0.1)
EOS ABS: 0 10*3/uL (ref 0.0–0.5)
EOS%: 0 % (ref 0.0–7.0)
HEMATOCRIT: 33.9 % — AB (ref 38.4–49.9)
HEMOGLOBIN: 11 g/dL — AB (ref 13.0–17.1)
LYMPH#: 0.8 10*3/uL — AB (ref 0.9–3.3)
LYMPH%: 13.5 % — ABNORMAL LOW (ref 14.0–49.0)
MCH: 29.6 pg (ref 27.2–33.4)
MCHC: 32.5 g/dL (ref 32.0–36.0)
MCV: 90.9 fL (ref 79.3–98.0)
MONO#: 0.4 10*3/uL (ref 0.1–0.9)
MONO%: 6.7 % (ref 0.0–14.0)
NEUT#: 4.8 10*3/uL (ref 1.5–6.5)
NEUT%: 79.7 % — AB (ref 39.0–75.0)
PLATELETS: 426 10*3/uL — AB (ref 140–400)
RBC: 3.73 10*6/uL — ABNORMAL LOW (ref 4.20–5.82)
RDW: 19.4 % — AB (ref 11.0–14.6)
WBC: 6 10*3/uL (ref 4.0–10.3)

## 2015-05-16 LAB — COMPREHENSIVE METABOLIC PANEL
ALBUMIN: 3.5 g/dL (ref 3.5–5.0)
ALK PHOS: 81 U/L (ref 40–150)
ALT: 10 U/L (ref 0–55)
ANION GAP: 11 meq/L (ref 3–11)
AST: 13 U/L (ref 5–34)
BUN: 20 mg/dL (ref 7.0–26.0)
CALCIUM: 9.8 mg/dL (ref 8.4–10.4)
CO2: 26 mEq/L (ref 22–29)
Chloride: 106 mEq/L (ref 98–109)
Creatinine: 0.8 mg/dL (ref 0.7–1.3)
EGFR: 85 mL/min/{1.73_m2} — AB (ref >90)
Glucose: 150 mg/dl — ABNORMAL HIGH (ref 70–140)
POTASSIUM: 4.2 meq/L (ref 3.5–5.1)
SODIUM: 142 meq/L (ref 136–145)
TOTAL PROTEIN: 7.3 g/dL (ref 6.4–8.3)

## 2015-05-16 MED ORDER — SODIUM CHLORIDE 0.9 % IV SOLN
479.0000 mg | Freq: Once | INTRAVENOUS | Status: AC
  Administered 2015-05-16: 480 mg via INTRAVENOUS
  Filled 2015-05-16: qty 48

## 2015-05-16 MED ORDER — SODIUM CHLORIDE 0.9 % IV SOLN
500.0000 mg/m2 | Freq: Once | INTRAVENOUS | Status: AC
  Administered 2015-05-16: 1000 mg via INTRAVENOUS
  Filled 2015-05-16: qty 40

## 2015-05-16 MED ORDER — SODIUM CHLORIDE 0.9 % IV SOLN
Freq: Once | INTRAVENOUS | Status: AC
  Administered 2015-05-16: 13:00:00 via INTRAVENOUS

## 2015-05-16 MED ORDER — SODIUM CHLORIDE 0.9 % IV SOLN
Freq: Once | INTRAVENOUS | Status: AC
  Administered 2015-05-16: 13:00:00 via INTRAVENOUS
  Filled 2015-05-16: qty 8

## 2015-05-16 MED ORDER — PREDNISONE 10 MG PO TABS: ORAL_TABLET | ORAL | Status: DC

## 2015-05-16 NOTE — Progress Notes (Signed)
Oncology Nurse Navigator Documentation  Oncology Nurse Navigator Flowsheets 05/16/2015  Navigator Encounter Type Clinic/MDC/spoke   Patient Visit Type Follow-up  Treatment Phase Treatment  Barriers/Navigation Needs Education  Education Other  Interventions Education Method  Coordination of Care -  Education Method Verbal  Specialty Items/DME -  Time Spent with Patient 30

## 2015-05-16 NOTE — Progress Notes (Signed)
[facility] Telephone:([phone]) [phone]   Fax:([phone]) [phone]  OFFICE PROGRESS NOTE  [name], MD [place] [address] [place] [address]  DIAGNOSIS: Stage IV (T2b, N2, M1b) non-small cell lung cancer, adenocarcinoma, with negative EGFR mutation, presented with right upper lobe lung mass in addition to pleural based masses, mediastinal lymphadenopathy as well as metastatic disease to the left adrenal, bone and abdominal lymph nodes diagnosed in August 2016.  PRIOR THERAPY: Status post right Pleurx catheter placement under the care of [name] on 03/01/2015  CURRENT THERAPY: Systemic chemotherapy with carboplatin for AUC of 5 and Alimta 500 MG/M2. First cycle on 03/14/2015. Status post 3 cycles.   INTERVAL HISTORY: Sean Cox 81 y.o. male returns to the clinic today for follow-up visit. The patient is feeling fine today except for fatigue the second week after his chemotherapy in addition to increased lacrimation and burning sensation in his eyes with visual disturbances. He was seen by his ophthalmologist in the past and was also started on isotonic eyedrops with minimal improvement. It was explained to patient in the past that these could be side effects from his chemotherapy and one of the option is to discontinue this treatment and switch to a different regimen but he declined. He continues to have some issues and conflict with the staff at the [facility]. In several occasions, he has been a little bit rude with the staff and hang up phones on them when the call him back to answer some of his questions. He was very pleasant during the visit today. He otherwise denied having any specific complaints with his treatment. The patient denied having any significant weight loss or night sweats. He denied having any nausea or vomiting, no fever or chills. He denied having any headache or visual changes. He had repeat CT scan of the chest, abdomen and pelvis  performed recently and he is here for evaluation and discussion of his scan results.  MEDICAL HISTORY: Past Medical History  Diagnosis Date  . Depression   . Hypothyroidism   . Hypercholesteremia   . Glaucoma   . Prostate cancer ([place])   . Ulcerative proctitis ([facility])   . DDD (degenerative disc disease), lumbosacral   . Anxiety   . IBS (irritable bowel syndrome)   . Detached retina   . Impaired fasting glucose   . Heart murmur     recently dx'ed with this 4 weeks ago  . Hypertension     no longer on medications  . GERD (gastroesophageal reflux disease)     in his younger years    ALLERGIES:  is allergic to codeine; imuran; remicade; simvastatin; and sulfa antibiotics.  MEDICATIONS:  Current Outpatient Prescriptions  Medication Sig Dispense Refill  . acetaminophen (TYLENOL) 500 MG tablet Take 1,000 mg by mouth every 4 (four) hours as needed for mild pain, moderate pain, fever or headache.    . APRISO 0.375 G 24 hr capsule Take 8 capsules by mouth daily.   11  . atorvastatin (LIPITOR) 20 MG tablet Take 20 mg by mouth daily.   1  . dexamethasone (DECADRON) 4 MG tablet 4 mg po bid the day before, day of and day after chemo. 40 tablet 1  . dorzolamide (TRUSOPT) 2 % ophthalmic solution Place 1 drop into both eyes 3 (three) times daily.  3  . folic acid (FOLVITE) 1 MG tablet Take 1 tablet (1 mg total) by mouth daily. 30 tablet 4  .  levothyroxine (SYNTHROID, LEVOTHROID) 25 MCG tablet Take 25 mcg by mouth daily.   1  . magnesium citrate SOLN Take 0.75 Bottles by mouth once.    [name] PARoxetine (PAXIL) 40 MG tablet TAKE 1 TABLET BY MOUTH DAILY  3  . polyethylene glycol powder (MIRALAX) powder Take 17 g by mouth 2 (two) times daily. 255 g 0  . prochlorperazine (COMPAZINE) 10 MG tablet Take 1 tablet (10 mg total) by mouth every 6 (six) hours as needed for nausea or vomiting. 30 tablet 0  . senna (SENOKOT) 8.6 MG tablet Take 1 tablet by mouth daily as needed for constipation.     . temazepam  (RESTORIL) 15 MG capsule Take 15 mg by mouth at bedtime as needed for sleep.    [name] timolol (TIMOPTIC) 0.5 % ophthalmic solution Place 1 drop into both eyes 3 (three) times daily.  3  . traMADol (ULTRAM) 50 MG tablet Take 1-2 tablets (50-100 mg total) by mouth every 6 (six) hours as needed (pain). 40 tablet 0   No current facility-administered medications for this visit.    SURGICAL HISTORY:  Past Surgical History  Procedure Laterality Date  . Colon surgery    . Prostatectomy      2004- Dr Risa Grill  . Tonsillectomy    . Diverticular stricture removed      1992  . Fatty tumor excision      in chest  . Chest tube insertion Right 03/01/2015    Procedure: INSERTION PLEURAL DRAINAGE CATHETER RIGHT CHEST;  Surgeon: [name], MD;  Location: [facility];  Service: Thoracic;  Laterality: Right;  . Talc pleurodesis Right 04/26/2015    Procedure: Pietro Cassis;  Surgeon: [name], MD;  Location: [facility];  Service: Thoracic;  Laterality: Right;  . Removal of pleural drainage catheter Right 04/26/2015    Procedure: REMOVAL OF PLEURAL DRAINAGE CATHETER;  Surgeon: [name], MD;  Location: [facility];  Service: Thoracic;  Laterality: Right;    REVIEW OF SYSTEMS:  Constitutional: positive for fatigue Eyes: positive for irritation, visual disturbance and Increased lacrimation. Ears, nose, mouth, throat, and face: negative Respiratory: negative Cardiovascular: negative Gastrointestinal: negative Genitourinary:negative Integument/breast: negative Hematologic/lymphatic: negative Musculoskeletal:negative Neurological: negative Behavioral/Psych: negative Endocrine: negative Allergic/Immunologic: negative   PHYSICAL EXAMINATION: General appearance: alert, cooperative, fatigued and no distress Head: Normocephalic, without obvious abnormality, atraumatic Neck: no adenopathy, no JVD, supple, symmetrical, trachea midline and thyroid not enlarged, symmetric, no  tenderness/mass/nodules Lymph nodes: Cervical, supraclavicular, and axillary nodes normal. Resp: clear to auscultation bilaterally Back: symmetric, no curvature. ROM normal. No CVA tenderness. Cardio: regular rate and rhythm, S1, S2 normal, no murmur, click, rub or gallop GI: soft, non-tender; bowel sounds normal; no masses,  no organomegaly Extremities: extremities normal, atraumatic, no cyanosis or edema Neurologic: Alert and oriented X 3, normal strength and tone. Normal symmetric reflexes. Normal coordination and gait  ECOG PERFORMANCE STATUS: 1 - Symptomatic but completely ambulatory  Blood pressure 146/79, pulse 68, temperature 97.7 F (36.5 C), temperature source Oral, resp. rate 17, height $RemoveBe'5\' 8"'wTBggYaRS$  (1.727 m), weight 186 lb 11.2 oz (84.687 kg), SpO2 93 %.  LABORATORY DATA: Lab Results  Component Value Date   WBC 6.0 05/16/2015   HGB 11.0* 05/16/2015   HCT 33.9* 05/16/2015   MCV 90.9 05/16/2015   PLT 426* 05/16/2015      Chemistry      Component Value Date/Time   NA 143 05/09/2015 1400   NA 140 03/01/2015 0737   K 4.2 05/09/2015 1400  K 4.3 03/01/2015 0737   CL 101 03/01/2015 0737   CO2 30* 05/09/2015 1400   CO2 30 03/01/2015 0737   BUN 12.1 05/09/2015 1400   BUN 27* 03/01/2015 0737   CREATININE 0.9 05/09/2015 1400   CREATININE 1.03 03/01/2015 0737      Component Value Date/Time   CALCIUM 9.8 05/09/2015 1400   CALCIUM 9.4 03/01/2015 0737   ALKPHOS 79 05/09/2015 1400   ALKPHOS 86 03/01/2015 0737   AST 27 05/09/2015 1400   AST 15 03/01/2015 0737   ALT 23 05/09/2015 1400   ALT 10* 03/01/2015 0737   BILITOT 0.35 05/09/2015 1400   BILITOT 0.3 03/01/2015 0737       RADIOGRAPHIC STUDIES: Dg Chest 2 View  04/18/2015  CLINICAL DATA:  Lung cancer. EXAM: CHEST  2 VIEW COMPARISON:  03/21/2015.  02/27/2015.  PET-CT 02/16/2015. FINDINGS: Mediastinum and hilar structures stable. Right upper lobe mass is again identified. Right pleural catheter in stable position. Right  pleural effusion has improved. Tiny pneumothorax on the right again cannot be excluded. Heart size normal. No acute bony abnormality. IMPRESSION: 1. Persistent right upper lobe mass lesion. 2. Right pleural catheter again noted in stable position. Right pleural effusion has diminished. Small right pneumothorax again cannot be excluded . Electronically Signed   By: Marcello Moores  Register   On: 04/18/2015 11:41   Ct Chest W Contrast  05/15/2015  CLINICAL DATA:  Lung carcinoma diagnosed 2016. Chemotherapy ongoing. Personal history of prostate cancer and colorectal carcinoma. EXAM: CT CHEST, ABDOMEN, AND PELVIS WITH CONTRAST TECHNIQUE: Multidetector CT imaging of the chest, abdomen and pelvis was performed following the standard protocol during bolus administration of intravenous contrast. CONTRAST:  138mL OMNIPAQUE IOHEXOL 300 MG/ML  SOLN COMPARISON:  PET-CT 02/16/2015 FINDINGS: CT CHEST FINDINGS Mediastinum/Nodes: No axillary or supraclavicular adenopathy. Interval reduction in size of mediastinal lymph nodes. For example subcarinal lymph node measures 8 mm decreased from 21 mm. RIGHT hilar lymph node measures 2.5 cm decreased from 3.0 cm. Lungs/Pleura: RIGHT upper lobe suprahilar mass measures 3.6 x 4.3 cm (image 15, series 2) decreased from 5.4 x 4.3 cm on comparison CT scan. Interval reduction in pleural fluid in the RIGHT hemi thorax. Nodule at the LEFT lung base measures 10 mm decreased from 13 mm. No new pulmonary nodules. Musculoskeletal: Sclerotic lesions at T9 and T10 are unchanged. CT ABDOMEN PELVIS FINDINGS Hepatobiliary: No focal hepatic lesion.  Normal gallbladder. Pancreas: Normal pancreatic parenchyma. Spleen: The spleen. Adrenals/Urinary Tract: Thickening of the LEFT adrenal gland to 18 mm not changed. Kidneys, ureters and bladder normal. Stomach/Bowel: Stomach, small bowel, appendix, and cecum are normal. There is anastomosis the proximal sigmoid colon without obstruction or nodularity. Rectum normal.  Vascular/Lymphatic: Abdominal aorta is normal caliber. There is no retroperitoneal or periportal lymphadenopathy. No pelvic lymphadenopathy. Reproductive: Post prostatectomy. Musculoskeletal: Stable sclerotic lesions at T9, T10 and L5. IMPRESSION: Chest Impression: 1. Interval decrease in size of RIGHT upper lobe suprahilar mass. 2. Interval decrease size of mediastinal lymph nodes. 3. Interval decrease in size of LEFT lower lobe pulmonary nodule. 4. Interval decrease in volume of RIGHT pleural effusion. Abdomen / Pelvis Impression: 1. Stable thickening of the LEFT adrenal gland which was hypermetabolic on comparison PET-CT scan. 2. No disease progression the abdomen or pelvis. 3. Stable sclerotic lesions in the lower thoracic spine and lumbar spine. Lesion at T10 and iliac lesions were hypermetabolic on comparison PET-CT scan. Electronically Signed   By: Suzy Bouchard M.D.   On: 05/15/2015 13:26   Ct  Abdomen Pelvis W Contrast  05/15/2015  CLINICAL DATA:  Lung carcinoma diagnosed 2016. Chemotherapy ongoing. Personal history of prostate cancer and colorectal carcinoma. EXAM: CT CHEST, ABDOMEN, AND PELVIS WITH CONTRAST TECHNIQUE: Multidetector CT imaging of the chest, abdomen and pelvis was performed following the standard protocol during bolus administration of intravenous contrast. CONTRAST:  14mL OMNIPAQUE IOHEXOL 300 MG/ML  SOLN COMPARISON:  PET-CT 02/16/2015 FINDINGS: CT CHEST FINDINGS Mediastinum/Nodes: No axillary or supraclavicular adenopathy. Interval reduction in size of mediastinal lymph nodes. For example subcarinal lymph node measures 8 mm decreased from 21 mm. RIGHT hilar lymph node measures 2.5 cm decreased from 3.0 cm. Lungs/Pleura: RIGHT upper lobe suprahilar mass measures 3.6 x 4.3 cm (image 15, series 2) decreased from 5.4 x 4.3 cm on comparison CT scan. Interval reduction in pleural fluid in the RIGHT hemi thorax. Nodule at the LEFT lung base measures 10 mm decreased from 13 mm. No new  pulmonary nodules. Musculoskeletal: Sclerotic lesions at T9 and T10 are unchanged. CT ABDOMEN PELVIS FINDINGS Hepatobiliary: No focal hepatic lesion.  Normal gallbladder. Pancreas: Normal pancreatic parenchyma. Spleen: The spleen. Adrenals/Urinary Tract: Thickening of the LEFT adrenal gland to 18 mm not changed. Kidneys, ureters and bladder normal. Stomach/Bowel: Stomach, small bowel, appendix, and cecum are normal. There is anastomosis the proximal sigmoid colon without obstruction or nodularity. Rectum normal. Vascular/Lymphatic: Abdominal aorta is normal caliber. There is no retroperitoneal or periportal lymphadenopathy. No pelvic lymphadenopathy. Reproductive: Post prostatectomy. Musculoskeletal: Stable sclerotic lesions at T9, T10 and L5. IMPRESSION: Chest Impression: 1. Interval decrease in size of RIGHT upper lobe suprahilar mass. 2. Interval decrease size of mediastinal lymph nodes. 3. Interval decrease in size of LEFT lower lobe pulmonary nodule. 4. Interval decrease in volume of RIGHT pleural effusion. Abdomen / Pelvis Impression: 1. Stable thickening of the LEFT adrenal gland which was hypermetabolic on comparison PET-CT scan. 2. No disease progression the abdomen or pelvis. 3. Stable sclerotic lesions in the lower thoracic spine and lumbar spine. Lesion at T10 and iliac lesions were hypermetabolic on comparison PET-CT scan. Electronically Signed   By: Suzy Bouchard M.D.   On: 05/15/2015 13:26   ASSESSMENT AND PLAN: This is a pleasant 81 years old white male recently diagnosed with metastatic non-small cell lung cancer, adenocarcinoma with negative EGFR mutation and negative ALK gene translocation.  He is currently undergoing systemic chemotherapy with carboplatin and Alimta status post 3 cycles.  He continues to tolerate his treatment well except for the burning sensation and tears in his eyes in addition to fatigue the second week after his chemotherapy.  His recent CT scan of the chest, abdomen  and pelvis showed improvement of his disease was decrease in the size of the right upper lobe suprahilar mass as well as the mediastinal lymphadenopathy and left lower lobe pulmonary nodules as well as decrease in the volume of the right pleural effusion. I discussed the scan results and give him a copy of the report to the patient today. I had a lengthy discussion with the patient today to address the issues that he has been complaining about including: 1) he has some questions about the peripheral vein access for lab and chemotherapy. This was explained to the patient in details per the chemotherapy protocol. He understands that blood was drawn will be done from the most distal vein and keep the proximal veins for IV chemotherapy. 2) lacrimation and burning sensation of the IV after chemotherapy: This was explained to the patient that this is most likely related to  his treatment. It is expected to continue if he continues on the same regimen. I gave the patient the option of switching his treatment with different regimen or discontinuing treatment. He specifically mentioned that he can live with it and he does not want to change the chemotherapy regimen as long date is working. I advised the patient to use isotonic eyedrops. I will also start him on prednisone 10 mg by mouth daily for his fatigue and lacrimation of the eye. The patient understands that this recommendation may not improve his condition significantly but it worth the trial. 3) chemotherapy-induced fatigue: It usually happen on the second week after his chemotherapy. I started the patient on prednisone 10 mg by mouth daily but I also advised him to take Decadron 4 mg by mouth twice a day for few days if he has significant fatigue during that time. 4) Imuran listed as allergy: We looked at his record and this was discontinued by his gastroenterologist secondary to elevated liver enzymes. He would like to keep it listed as allergy at this  point. 5) issues with the staffs: The patient has concerned that some of the staff they do not own resolution of his issues and they always defer it to somebody else. I explained to the patient that we will continue to work on this issue with the staff at the [facility] and he has already been in discussion with the medical director Dr. Rolanda Jay, regarding some of these issues. I also strongly encouraged the patient to deal with the staff in a better way so they an be more responsive to his requests. By the end of the discussion with the patient was very satisfied with the answers and he would like to continue his current treatment with carboplatin and Alimta. He will proceed with cycle #4 today as scheduled. He would come back for follow-up visit in 3 weeks for reevaluation before starting cycle #5. The patient was seen with me during this visit by the thoracic navigator, Norton Blizzard and the rotating internal medicine resident, Ricarda Frame Rabbani. He was advised to call immediately if he has any concerning symptoms in the interval. The patient voices understanding of current disease status and treatment options and is in agreement with the current care plan.  All questions were answered. The patient knows to call the clinic with any problems, questions or concerns. We can certainly see the patient much sooner if necessary.  Disclaimer: This note was dictated with voice recognition software. Similar sounding words can inadvertently be transcribed and may not be corrected upon review.

## 2015-05-16 NOTE — Progress Notes (Signed)
[address]       [place] [address]             [phone]       HPI: Mr. Naill returns for a scheduled follow-up visit regarding his malignant right pleural effusion.  He is an 81 year old gentleman who was diagnosed with stage IV lung cancer over this year. He had a recurrent malignant right pleural effusion. Pleurx catheter was placed. He had rapid response with that. 3 weeks ago the drainage was minimal and we did a talc pleurodesis and remove the tube.  Since the tube was removed has not had any worsening of his respiratory status. He saw Dr. Julien Nordmann yesterday and was told that he had a good response to treatment. He has had 3 cycles of chemotherapy so far.  Past Medical History  Diagnosis Date  . Depression   . Hypothyroidism   . Hypercholesteremia   . Glaucoma   . Prostate cancer ([facility])   . Ulcerative proctitis ([place])   . DDD (degenerative disc disease), lumbosacral   . Anxiety   . IBS (irritable bowel syndrome)   . Detached retina   . Impaired fasting glucose   . Heart murmur     recently dx'ed with this 4 weeks ago  . Hypertension     no longer on medications  . GERD (gastroesophageal reflux disease)     in his younger years  . Malignant neoplasm of right upper lobe of lung ([facility]) 02/14/2015     Current Outpatient Prescriptions  Medication Sig Dispense Refill  . acetaminophen (TYLENOL) 500 MG tablet Take 1,000 mg by mouth every 4 (four) hours as needed for mild pain, moderate pain, fever or headache.    . APRISO 0.375 G 24 hr capsule Take 8 capsules by mouth daily.   11  . atorvastatin (LIPITOR) 20 MG tablet Take 20 mg by mouth daily.   1  . dexamethasone (DECADRON) 4 MG tablet 4 mg po bid the day before, day of and day after chemo. 40 tablet 1  . dorzolamide (TRUSOPT) 2 % ophthalmic solution Place 1 drop into both eyes 3 (three) times daily.  3  . folic acid (FOLVITE) 1 MG tablet Take 1 tablet (1 mg total) by mouth daily. 30 tablet 4  .  levothyroxine (SYNTHROID, LEVOTHROID) 25 MCG tablet Take 25 mcg by mouth daily.   1  . magnesium citrate SOLN Take 0.75 Bottles by mouth once.    [name] PARoxetine (PAXIL) 40 MG tablet TAKE 1 TABLET BY MOUTH DAILY  3  . polyethylene glycol powder (MIRALAX) powder Take 17 g by mouth 2 (two) times daily. 255 g 0  . predniSONE (DELTASONE) 10 MG tablet 1 tablet by mouth daily 30 tablet 1  . prochlorperazine (COMPAZINE) 10 MG tablet Take 1 tablet (10 mg total) by mouth every 6 (six) hours as needed for nausea or vomiting. 30 tablet 0  . senna (SENOKOT) 8.6 MG tablet Take 1 tablet by mouth daily as needed for constipation.     . temazepam (RESTORIL) 15 MG capsule Take 15 mg by mouth at bedtime as needed for sleep.    [name] timolol (TIMOPTIC) 0.5 % ophthalmic solution Place 1 drop into both eyes 3 (three) times daily.  3  . traMADol (ULTRAM) 50 MG tablet Take 1-2 tablets (50-100 mg total) by mouth every 6 (six) hours as needed (pain). 40 tablet 0   No current facility-administered medications for this visit.  Physical Exam: BP 150/94 mmHg  Pulse 66  Resp 20  Ht '5\' 8"'$  (1.727 m)  Wt 186 lb (84.369 kg)  BMI 28.29 kg/m2  SpO25 48% 81 year old man in no acute distress Alert and oriented 3 with no focal deficits Cardiac regular rate and rhythm normal S1 and S2 Lungs equal breath sounds bilaterally  Diagnostic Tests: I personally reviewed his chest x-ray from today and his CT scan from yesterday. A CT scan shows a good clinical response to chemotherapy with a decrease in the size of the mass, lymph nodes, and pleural effusion. His chest x-ray today shows a minimal residual right pleural effusion.  Impression: Mr. Aument is an 81 year old gentleman who had a malignant right pleural effusion. This was treated with a pleural catheter and then finally a talc pleurodesis. He's about 3 weeks out from catheter removal with no evidence of recurrent effusion. There is always some risk of the effusion could  recur.  Plan: Follow-up with Dr. Julien Nordmann.  I will be happy to see him back again at any time if I can be of any further assistance with his care.   [name], MD [facility] [phone]

## 2015-05-16 NOTE — Patient Instructions (Signed)
[facility] Discharge Instructions for Patients Receiving Chemotherapy  Today you received the following chemotherapy agents Alimta and Carboplatin.  To help prevent nausea and vomiting after your treatment, we encourage you to take your nausea medication as prescribed.   If you develop nausea and vomiting that is not controlled by your nausea medication, call the clinic.   BELOW ARE SYMPTOMS THAT SHOULD BE REPORTED IMMEDIATELY:  *FEVER GREATER THAN 100.5 F  *CHILLS WITH OR WITHOUT FEVER  NAUSEA AND VOMITING THAT IS NOT CONTROLLED WITH YOUR NAUSEA MEDICATION  *UNUSUAL SHORTNESS OF BREATH  *UNUSUAL BRUISING OR BLEEDING  TENDERNESS IN MOUTH AND THROAT WITH OR WITHOUT PRESENCE OF ULCERS  *URINARY PROBLEMS  *BOWEL PROBLEMS  UNUSUAL RASH Items with * indicate a potential emergency and should be followed up as soon as possible.  Feel free to call the clinic you have any questions or concerns. The clinic phone number is [phone].  Please show the CHEMO ALERT CARD at check-in to the Emergency Department and triage nurse.   

## 2015-05-23 ENCOUNTER — Other Ambulatory Visit (HOSPITAL_COMMUNITY)
Admission: RE | Admit: 2015-05-23 | Discharge: 2015-05-23 | Disposition: A | Discharge status: Home / Self Care | Payer: Medicare Other | Source: Ambulatory Visit | Attending: Internal Medicine | Admitting: Internal Medicine

## 2015-05-23 ENCOUNTER — Other Ambulatory Visit (HOSPITAL_BASED_OUTPATIENT_CLINIC_OR_DEPARTMENT_OTHER): Payer: Medicare Other

## 2015-05-23 DIAGNOSIS — C3491 Malignant neoplasm of unspecified part of right bronchus or lung: Secondary | ICD-10-CM | POA: Insufficient documentation

## 2015-05-23 DIAGNOSIS — C7972 Secondary malignant neoplasm of left adrenal gland: Secondary | ICD-10-CM

## 2015-05-23 DIAGNOSIS — C3411 Malignant neoplasm of upper lobe, right bronchus or lung: Secondary | ICD-10-CM

## 2015-05-23 LAB — CBC WITH DIFFERENTIAL/PLATELET
BASO%: 0 % (ref 0.0–2.0)
BASOS ABS: 0 10*3/uL (ref 0.0–0.1)
EOS%: 0.3 % (ref 0.0–7.0)
Eosinophils Absolute: 0 10*3/uL (ref 0.0–0.5)
HCT: 37.1 % — ABNORMAL LOW (ref 38.4–49.9)
HGB: 11.9 g/dL — ABNORMAL LOW (ref 13.0–17.1)
LYMPH%: 23.9 % (ref 14.0–49.0)
MCH: 29.5 pg (ref 27.2–33.4)
MCHC: 32.1 g/dL (ref 32.0–36.0)
MCV: 91.9 fL (ref 79.3–98.0)
MONO#: 0.2 10*3/uL (ref 0.1–0.9)
MONO%: 4.7 % (ref 0.0–14.0)
NEUT#: 3.6 10*3/uL (ref 1.5–6.5)
NEUT%: 71.1 % (ref 39.0–75.0)
PLATELETS: 248 10*3/uL (ref 140–400)
RBC: 4.04 10*6/uL — AB (ref 4.20–5.82)
RDW: 19.4 % — ABNORMAL HIGH (ref 11.0–14.6)
WBC: 5 10*3/uL (ref 4.0–10.3)
lymph#: 1.2 10*3/uL (ref 0.9–3.3)

## 2015-05-23 LAB — COMPREHENSIVE METABOLIC PANEL
ALT: 11 U/L — ABNORMAL LOW (ref 17–63)
ANION GAP: 9 (ref 5–15)
AST: 14 U/L — AB (ref 15–41)
Albumin: 4 g/dL (ref 3.5–5.0)
Alkaline Phosphatase: 70 U/L (ref 38–126)
BUN: 28 mg/dL — ABNORMAL HIGH (ref 6–20)
CHLORIDE: 100 mmol/L — AB (ref 101–111)
CO2: 31 mmol/L (ref 22–32)
Calcium: 9.6 mg/dL (ref 8.9–10.3)
Creatinine, Ser: 0.88 mg/dL (ref 0.61–1.24)
Glucose, Bld: 160 mg/dL — ABNORMAL HIGH (ref 65–99)
POTASSIUM: 4.3 mmol/L (ref 3.5–5.1)
Sodium: 140 mmol/L (ref 135–145)
TOTAL PROTEIN: 7.3 g/dL (ref 6.5–8.1)
Total Bilirubin: 0.5 mg/dL (ref 0.3–1.2)

## 2015-05-30 ENCOUNTER — Ambulatory Visit (HOSPITAL_BASED_OUTPATIENT_CLINIC_OR_DEPARTMENT_OTHER): Payer: Medicare Other | Admitting: Nurse Practitioner

## 2015-05-30 ENCOUNTER — Other Ambulatory Visit (HOSPITAL_BASED_OUTPATIENT_CLINIC_OR_DEPARTMENT_OTHER): Payer: Medicare Other

## 2015-05-30 ENCOUNTER — Telehealth: Payer: Self-pay | Admitting: Internal Medicine

## 2015-05-30 ENCOUNTER — Other Ambulatory Visit: Payer: Self-pay

## 2015-05-30 ENCOUNTER — Encounter: Payer: Self-pay | Admitting: Nurse Practitioner

## 2015-05-30 VITALS — BP 157/86 | HR 60 | Resp 18 | Ht 68.0 in | Wt 177.6 lb

## 2015-05-30 DIAGNOSIS — F419 Anxiety disorder, unspecified: Secondary | ICD-10-CM

## 2015-05-30 DIAGNOSIS — R451 Restlessness and agitation: Secondary | ICD-10-CM

## 2015-05-30 DIAGNOSIS — C3411 Malignant neoplasm of upper lobe, right bronchus or lung: Secondary | ICD-10-CM

## 2015-05-30 DIAGNOSIS — C3491 Malignant neoplasm of unspecified part of right bronchus or lung: Secondary | ICD-10-CM

## 2015-05-30 LAB — CBC WITH DIFFERENTIAL/PLATELET
BASO%: 0.1 % (ref 0.0–2.0)
Basophils Absolute: 0 10*3/uL (ref 0.0–0.1)
EOS%: 0.1 % (ref 0.0–7.0)
Eosinophils Absolute: 0 10*3/uL (ref 0.0–0.5)
HEMATOCRIT: 37.5 % — AB (ref 38.4–49.9)
HGB: 12 g/dL — ABNORMAL LOW (ref 13.0–17.1)
LYMPH#: 1.3 10*3/uL (ref 0.9–3.3)
LYMPH%: 17.5 % (ref 14.0–49.0)
MCH: 29.7 pg (ref 27.2–33.4)
MCHC: 32 g/dL (ref 32.0–36.0)
MCV: 92.7 fL (ref 79.3–98.0)
MONO#: 0.8 10*3/uL (ref 0.1–0.9)
MONO%: 11.7 % (ref 0.0–14.0)
NEUT%: 70.6 % (ref 39.0–75.0)
NEUTROS ABS: 5.1 10*3/uL (ref 1.5–6.5)
Platelets: 98 10*3/uL — ABNORMAL LOW (ref 140–400)
RBC: 4.05 10*6/uL — ABNORMAL LOW (ref 4.20–5.82)
RDW: 19.3 % — AB (ref 11.0–14.6)
WBC: 7.2 10*3/uL (ref 4.0–10.3)

## 2015-05-30 LAB — COMPREHENSIVE METABOLIC PANEL
ALK PHOS: 74 U/L (ref 40–150)
ALT: 12 U/L (ref 0–55)
AST: 12 U/L (ref 5–34)
Albumin: 3.5 g/dL (ref 3.5–5.0)
Anion Gap: 8 mEq/L (ref 3–11)
BUN: 19.9 mg/dL (ref 7.0–26.0)
CALCIUM: 9.6 mg/dL (ref 8.4–10.4)
CHLORIDE: 104 meq/L (ref 98–109)
CO2: 29 meq/L (ref 22–29)
Creatinine: 0.8 mg/dL (ref 0.7–1.3)
EGFR: 83 mL/min/{1.73_m2} — ABNORMAL LOW (ref >90)
GLUCOSE: 119 mg/dL (ref 70–140)
Potassium: 4.5 mEq/L (ref 3.5–5.1)
Sodium: 140 mEq/L (ref 136–145)
TOTAL PROTEIN: 6.8 g/dL (ref 6.4–8.3)
Total Bilirubin: 0.61 mg/dL (ref 0.20–1.20)

## 2015-05-30 NOTE — Telephone Encounter (Signed)
Message to inf mgr/scheduler re request per 12/20 pof from [facility] to schedule all future chemo appointments in a bed to avoid further patient anxiety and agitation. Next appointment 12/27. No other orders per 12/20 pof.

## 2015-05-30 NOTE — Assessment & Plan Note (Signed)
Patient completed cycle 4 of his carboplatin/Alimta chemotherapy regimen on 05/16/2015.  He presented to the [facility] today reporting continued fatigue and chronic tearing of his eyes; despite recent prescription for steroid-induced to see if this would help.  He denies any other new symptoms.  He denies any recent fevers or chills.  Patient admits to increased anxiety and agitation recently.  He attributes this to his 4 cycles of chemotherapy.  After long discussion with Dr. Webb Laws patient has made the decision to proceed with single agent Alimta only in the future.  Blood count obtained today revealed a WBC of 7.2, ANC 5.1, hemoglobin 12.0, and platelet count 98.  Complete metabolic panel was essentially within normal limits.  Patient is scheduled to return on 06/06/2015 for labs, visit, and his first cycle of single agent Alimta chemotherapy.

## 2015-05-30 NOTE — Assessment & Plan Note (Signed)
Patient and his daughter presented to the [facility] today as a walk-in; with complaint of increased anxiety and agitation recently.  Patient states that he is continually fatigued and has chronic excessive tearing of his bilateral eyes since initiating his chemotherapy.  Patient was apparently very loud and verbally abusive to multiple staff members- both in lab and while checking in to the symptom management clinic today.  This provider observed the patient yelling and cursing loudly, and appeared very agitated. This provider advised pt that he would need to lower his voice and avoid cursing; since he was in a medical facility with other ill patients.  Patient continued very loud and was fairly rude and abusive to multiple staff members during the visit.  However, patient's tone and entire demeanor calmed greatly in the presence of Dr. Julien Nordmann.  Apologized to the multiple patients that also witnessed/heard these events.  Patient's daughter met privately with this provider; and advised the patient has always she yelled and cursed both in his home and out in public- but she feels that his behavior has now become more out of control.  She feels overwhelmed regarding the patient's behavior.  She states she feels no support from either her mother or her family in regards to the care of her father.  She is questioning if could consider either hospice or palliative care to help in the home.  Long discussion with the patient's daughter regarding both hospice and palliative care.  Advised that hospice in the home would require that patient discontinue all active cancer treatments.  Also advised daughter that palliative care will assist with  chronic pain and other chronic issues-but would not physically assist in the home.  Discussed the option of home health; but daughter states that her father would most likely be in disagreement with this plan.  After long discussion with both Dr. Julien Nordmann in this  provider.-Patient did sign a HIPPA form to allow specific family members to discuss patient's healthcare.  Patient has made the decision to discontinue the carboplatin/Alimta chemotherapy regimen; and to instead switched to single agent Alimta chemotherapy-to see this is better tolerated.  Also, advised patient to discontinue any previously prescribed steroids/prednisone; since this may also increase anxiety and agitation as well.   FUTURE PLAN: WILL HAVE PT PLACED IN ONE OF THE BEDS IN INFUSION FOR ALL LAB DRAWS AND CHEMO/INFUSIONS IN THE FUTURE TO HOPEFULLY DECREASED AGITATION/ANXIETY.

## 2015-05-30 NOTE — Telephone Encounter (Signed)
per Dr Sean Cox to give pt copy of prev sch appts

## 2015-05-30 NOTE — Progress Notes (Signed)
SYMPTOM MANAGEMENT CLINIC   HPI: Sean Cox 81 y.o. male diagnosed with lung cancer.  Patient received cycle 4 of his carboplatin/Alimta chemotherapy regimen on the super 6 2016.  Patient and his daughter presented to the [facility] today as a walk-in; with complaint of increased anxiety and agitation recently.  Patient states that he is continually fatigued and has chronic excessive tearing of his bilateral eyes since initiating his chemotherapy.  Patient was apparently very loud and verbally abusive to multiple staff members- both in lab and while checking in to the symptom management clinic today.  This provider observed the patient yelling and cursing loudly, and appeared very agitated. This provider advised pt that he would need to lower his voice and avoid cursing; since he was in a medical facility with other ill patients.  Patient continued very loud and was fairly rude and abusive to multiple staff members during the visit.  However, patient's tone and entire demeanor calmed greatly in the presence of Dr. Julien Nordmann.  Apologized to the multiple patients that also witnessed/heard these events.  Patient's daughter met privately with this provider; and advised the patient has always she yelled and cursed both in his home and out in public- but she feels that his behavior has now become more out of control.  She feels overwhelmed regarding the patient's behavior.  She states she feels no support from either her mother or her family in regards to the care of her father.  She is questioning if could consider either hospice or palliative care to help in the home.  Long discussion with the patient's daughter regarding both hospice and palliative care.  Advised that hospice in the home would require that patient discontinue all active cancer treatments.  Also advised daughter that palliative care will assist with  chronic pain and other chronic issues-but would not physically assist in the home.   Discussed the option of home health; but daughter states that her father would most likely be in disagreement with this plan.  After long discussion with both Dr. Julien Nordmann in this provider.-Patient did sign a HIPPA form to allow specific family members to discuss patient's healthcare.  Patient has made the decision to discontinue the carboplatin/Alimta chemotherapy regimen; and to instead switched to single agent Alimta chemotherapy-to see this is better tolerated.  Also, advised patient to discontinue any previously prescribed steroids/prednisone; since this may also increase anxiety and agitation as well.   HPI  ROS  Past Medical History  Diagnosis Date  . Depression   . Hypothyroidism   . Hypercholesteremia   . Glaucoma   . Prostate cancer ([place])   . Ulcerative proctitis ([place])   . DDD (degenerative disc disease), lumbosacral   . Anxiety   . IBS (irritable bowel syndrome)   . Detached retina   . Impaired fasting glucose   . Heart murmur     recently dx'ed with this 4 weeks ago  . Hypertension     no longer on medications  . GERD (gastroesophageal reflux disease)     in his younger years  . Malignant neoplasm of right upper lobe of lung ([place]) 02/14/2015    Past Surgical History  Procedure Laterality Date  . Colon surgery    . Prostatectomy      2004- Dr Risa Grill  . Tonsillectomy    . Diverticular stricture removed      1992  . Fatty tumor excision      in chest  . Chest tube insertion  Right 03/01/2015    Procedure: INSERTION PLEURAL DRAINAGE CATHETER RIGHT CHEST;  Surgeon: [name], MD;  Location: [facility];  Service: Thoracic;  Laterality: Right;  . Talc pleurodesis Right 04/26/2015    Procedure: Pietro Cassis;  Surgeon: [name], MD;  Location: [facility];  Service: Thoracic;  Laterality: Right;  . Removal of pleural drainage catheter Right 04/26/2015    Procedure: REMOVAL OF PLEURAL DRAINAGE CATHETER;  Surgeon: [name], MD;  Location: [facility];  Service: Thoracic;  Laterality: Right;    has Pleural effusion on right; Ascending aortic aneurysm ([place]); Malignant neoplasm of right upper lobe of lung ([place]); Encounter for antineoplastic chemotherapy; Excessive lacrimation; and Agitation on his problem list.    is allergic to codeine; imuran; remicade; simvastatin; and sulfa antibiotics.    Medication List       This list is accurate as of: 05/30/15  5:22 PM.  Always use your most recent med list.               acetaminophen 500 MG tablet  Commonly known as:  TYLENOL  Take 1,000 mg by mouth every 4 (four) hours as needed for mild pain, moderate pain, fever or headache.     APRISO 0.375 G 24 hr capsule  Generic drug:  mesalamine  Take 8 capsules by mouth daily.     atorvastatin 20 MG tablet  Commonly known as:  LIPITOR  Take 20 mg by mouth daily.     dexamethasone 4 MG tablet  Commonly known as:  DECADRON  4 mg po bid the day before, day of and day after chemo.     dorzolamide 2 % ophthalmic solution  Commonly known as:  TRUSOPT  Place 1 drop into both eyes 3 (three) times daily.     folic acid 1 MG tablet  Commonly known as:  FOLVITE  Take 1 tablet (1 mg total) by mouth daily.     levothyroxine 25 MCG tablet  Commonly known as:  SYNTHROID, LEVOTHROID  Take 25 mcg by mouth daily.     magnesium citrate Soln  Take 0.75 Bottles by mouth once.     PARoxetine 40 MG tablet  Commonly known as:  PAXIL  TAKE 1 TABLET BY MOUTH DAILY     polyethylene glycol powder powder  Commonly known as:  MIRALAX  Take 17 g by mouth 2 (two) times daily.     predniSONE 10 MG tablet  Commonly known as:  DELTASONE  1 tablet by mouth daily     prochlorperazine 10 MG tablet  Commonly known as:  COMPAZINE  Take 1 tablet (10 mg total) by mouth every 6 (six) hours as needed for nausea or vomiting.     senna 8.6 MG tablet  Commonly known as:  SENOKOT  Take 1 tablet by mouth daily as needed for constipation.     temazepam 15 MG  capsule  Commonly known as:  RESTORIL  Take 15 mg by mouth at bedtime as needed for sleep.     timolol 0.5 % ophthalmic solution  Commonly known as:  TIMOPTIC  Place 1 drop into both eyes 3 (three) times daily.     traMADol 50 MG tablet  Commonly known as:  ULTRAM  Take 1-2 tablets (50-100 mg total) by mouth every 6 (six) hours as needed (pain).         PHYSICAL EXAMINATION  Oncology Vitals 05/30/2015 05/16/2015  Height 173 cm 173 cm  Weight 80.559 kg 84.369  kg  Weight (lbs) 177 lbs 10 oz 186 lbs  BMI (kg/m2) 27 kg/m2 28.28 kg/m2  Temp (No Data) -  Pulse 60 66  Resp 18 20  SpO2 98 94  BSA (m2) 1.97 m2 2.01 m2   BP Readings from Last 2 Encounters:  05/30/15 157/86  05/16/15 150/94    Physical Exam  Constitutional: He is oriented to person, place, and time and well-developed, well-nourished, and in no distress.  HENT:  Head: Normocephalic and atraumatic.  Eyes: Conjunctivae and EOM are normal. Pupils are equal, round, and reactive to light. Right eye exhibits no discharge. Left eye exhibits no discharge. No scleral icterus.  Neck: Normal range of motion.  Pulmonary/Chest: Effort normal. No respiratory distress.  Musculoskeletal: Normal range of motion.  Neurological: He is alert and oriented to person, place, and time. Gait normal.  Psychiatric: His mood appears anxious. His affect is blunt and inappropriate. He is agitated. He exhibits disordered thought content.  Nursing note and vitals reviewed.   LABORATORY DATA:. Appointment on 05/30/2015  Component Date Value Ref Range Status  . WBC 05/30/2015 7.2  4.0 - 10.3 10e3/uL Final  . NEUT# 05/30/2015 5.1  1.5 - 6.5 10e3/uL Final  . HGB 05/30/2015 12.0* 13.0 - 17.1 g/dL Final  . HCT 05/30/2015 37.5* 38.4 - 49.9 % Final  . Platelets 05/30/2015 98* 140 - 400 10e3/uL Final  . MCV 05/30/2015 92.7  79.3 - 98.0 fL Final  . MCH 05/30/2015 29.7  27.2 - 33.4 pg Final  . MCHC 05/30/2015 32.0  32.0 - 36.0 g/dL Final  . RBC  05/30/2015 4.05* 4.20 - 5.82 10e6/uL Final  . RDW 05/30/2015 19.3* 11.0 - 14.6 % Final  . lymph# 05/30/2015 1.3  0.9 - 3.3 10e3/uL Final  . MONO# 05/30/2015 0.8  0.1 - 0.9 10e3/uL Final  . Eosinophils Absolute 05/30/2015 0.0  0.0 - 0.5 10e3/uL Final  . Basophils Absolute 05/30/2015 0.0  0.0 - 0.1 10e3/uL Final  . NEUT% 05/30/2015 70.6  39.0 - 75.0 % Final  . LYMPH% 05/30/2015 17.5  14.0 - 49.0 % Final  . MONO% 05/30/2015 11.7  0.0 - 14.0 % Final  . EOS% 05/30/2015 0.1  0.0 - 7.0 % Final  . BASO% 05/30/2015 0.1  0.0 - 2.0 % Final  . Sodium 05/30/2015 140  136 - 145 mEq/L Final  . Potassium 05/30/2015 4.5  3.5 - 5.1 mEq/L Final  . Chloride 05/30/2015 104  98 - 109 mEq/L Final  . CO2 05/30/2015 29  22 - 29 mEq/L Final  . Glucose 05/30/2015 119  70 - 140 mg/dl Final   Glucose reference range is for nonfasting patients. Fasting glucose reference range is 70- 100.  [name] BUN 05/30/2015 19.9  7.0 - 26.0 mg/dL Final  . Creatinine 05/30/2015 0.8  0.7 - 1.3 mg/dL Final  . Total Bilirubin 05/30/2015 0.61  0.20 - 1.20 mg/dL Final  . Alkaline Phosphatase 05/30/2015 74  40 - 150 U/L Final  . AST 05/30/2015 12  5 - 34 U/L Final  . ALT 05/30/2015 12  0 - 55 U/L Final  . Total Protein 05/30/2015 6.8  6.4 - 8.3 g/dL Final  . Albumin 05/30/2015 3.5  3.5 - 5.0 g/dL Final  . Calcium 05/30/2015 9.6  8.4 - 10.4 mg/dL Final  . Anion Gap 05/30/2015 8  3 - 11 mEq/L Final  . EGFR 05/30/2015 83* >90 ml/min/1.73 m2 Final   eGFR is calculated using the CKD-EPI Creatinine Equation (2009)  RADIOGRAPHIC STUDIES: No results found.  ASSESSMENT/PLAN:    Malignant neoplasm of right upper lobe of lung [facility]) Patient completed cycle 4 of his carboplatin/Alimta chemotherapy regimen on 05/16/2015.  He presented to the cancer Center today reporting continued fatigue and chronic tearing of his eyes; despite recent prescription for steroid-induced to see if this would help.  He denies any other new symptoms.  He denies any  recent fevers or chills.  Patient admits to increased anxiety and agitation recently.  He attributes this to his 4 cycles of chemotherapy.  After long discussion with Dr. Kathaleen Bury patient has made the decision to proceed with single agent Alimta only in the future.  Blood count obtained today revealed a WBC of 7.2, ANC 5.1, hemoglobin 12.0, and platelet count 98.  Complete metabolic panel was essentially within normal limits.  Patient is scheduled to return on 06/06/2015 for labs, visit, and his first cycle of single agent Alimta chemotherapy.  Agitation Patient and his daughter presented to the cancer Center today as a walk-in; with complaint of increased anxiety and agitation recently.  Patient states that he is continually fatigued and has chronic excessive tearing of his bilateral eyes since initiating his chemotherapy.  Patient was apparently very loud and verbally abusive to multiple staff members- both in lab and while checking in to the symptom management clinic today.  This provider observed the patient yelling and cursing loudly, and appeared very agitated. This provider advised pt that he would need to lower his voice and avoid cursing; since he was in a medical facility with other ill patients.  Patient continued very loud and was fairly rude and abusive to multiple staff members during the visit.  However, patient's tone and entire demeanor calmed greatly in the presence of Dr. Arbutus Ped.  Apologized to the multiple patients that also witnessed/heard these events.  Patient's daughter met privately with this provider; and advised the patient has always she yelled and cursed both in his home and out in public- but she feels that his behavior has now become more out of control.  She feels overwhelmed regarding the patient's behavior.  She states she feels no support from either her mother or her family in regards to the care of her father.  She is questioning if could consider either  hospice or palliative care to help in the home.  Long discussion with the patient's daughter regarding both hospice and palliative care.  Advised that hospice in the home would require that patient discontinue all active cancer treatments.  Also advised daughter that palliative care will assist with  chronic pain and other chronic issues-but would not physically assist in the home.  Discussed the option of home health; but daughter states that her father would most likely be in disagreement with this plan.  After long discussion with both Dr. Arbutus Ped in this provider.-Patient did sign a HIPPA form to allow specific family members to discuss patient's healthcare.  Patient has made the decision to discontinue the carboplatin/Alimta chemotherapy regimen; and to instead switched to single agent Alimta chemotherapy-to see this is better tolerated.  Also, advised patient to discontinue any previously prescribed steroids/prednisone; since this may also increase anxiety and agitation as well.   FUTURE PLAN: WILL HAVE PT PLACED IN ONE OF THE BEDS IN INFUSION FOR ALL LAB DRAWS AND CHEMO/INFUSIONS IN THE FUTURE TO HOPEFULLY DECREASED AGITATION/ANXIETY.   Patient stated understanding of all instructions; and was in agreement with this plan of care. The patient knows to call the  clinic with any problems, questions or concerns.   This was a shared visit with Dr. Julien Nordmann today.  Total time spent with patient was 40 minutes;  with greater than 75 percent of that time spent in face to face counseling regarding patient's symptoms,  and coordination of care and follow up.  Disclaimer:This dictation was prepared with Dragon/digital dictation along with [employer]. Any transcriptional errors that result from this process are unintentional.  Drue Second, NP 05/30/2015   ADDENDUM: Hematology/Oncology Attending: I had a face to face encounter with the patient. I recommended his care plan. This is a  pleasant 81 years old white male with stage IV non-small cell lung cancer currently undergoing systemic chemotherapy with carboplatin and Alimta status post 4 cycles with good response after cycle #3. The patient came to the clinic as a walk-in visit based on his daughter's request for further evaluation of her father was becoming very aggressive in behavior to the family and also several staff members.  I had a lengthy discussion with the patient today about his current condition and behavior. He mentions that he is feeling tired all the time and unable to function at home and this is frustrating him. I explained to the patient that this could be side effects from his treatment and it is okay to discontinue treatment completely but he declined this option. I then discussed with him consideration of discontinuation of the induction chemotherapy and moving to maintenance treatment with single agent Alimta which could be much easier on him compared to the double chemotherapy regimen with carboplatin and Alimta. After a lengthy discussion and explaining the benefit and risk of this option, the patient agreed to proceed with the maintenance treatment. He will be treated with single agent Alimta 500 MG/m2 every 3 weeks starting next week. I also encouraged the patient to take the medical staff and support team with respect. He understands that continuing this behavior may result in discharging him from the practice. He would come back for follow-up visit in one week for reevaluation before starting the first cycle of his maintenance treatment. The patient was advised to call immediately if he has any other concerning symptoms in the interval.  Disclaimer: This note was dictated with voice recognition software. Similar sounding words can inadvertently be transcribed and may be missed upon review. [name]., MD 05/30/2015

## 2015-06-01 ENCOUNTER — Telehealth: Payer: Self-pay | Admitting: *Deleted

## 2015-06-06 ENCOUNTER — Other Ambulatory Visit (HOSPITAL_BASED_OUTPATIENT_CLINIC_OR_DEPARTMENT_OTHER): Payer: Medicare Other

## 2015-06-06 ENCOUNTER — Encounter: Payer: Self-pay | Admitting: *Deleted

## 2015-06-06 ENCOUNTER — Ambulatory Visit (HOSPITAL_BASED_OUTPATIENT_CLINIC_OR_DEPARTMENT_OTHER): Payer: Medicare Other | Admitting: Internal Medicine

## 2015-06-06 ENCOUNTER — Telehealth: Payer: Self-pay | Admitting: *Deleted

## 2015-06-06 ENCOUNTER — Ambulatory Visit: Payer: Medicare Other

## 2015-06-06 ENCOUNTER — Encounter: Payer: Self-pay | Admitting: Internal Medicine

## 2015-06-06 VITALS — BP 147/69 | HR 69 | Temp 97.9°F | Resp 18 | Ht 68.0 in | Wt 183.5 lb

## 2015-06-06 DIAGNOSIS — C3411 Malignant neoplasm of upper lobe, right bronchus or lung: Secondary | ICD-10-CM

## 2015-06-06 DIAGNOSIS — C3491 Malignant neoplasm of unspecified part of right bronchus or lung: Secondary | ICD-10-CM

## 2015-06-06 DIAGNOSIS — C7972 Secondary malignant neoplasm of left adrenal gland: Secondary | ICD-10-CM

## 2015-06-06 DIAGNOSIS — C772 Secondary and unspecified malignant neoplasm of intra-abdominal lymph nodes: Secondary | ICD-10-CM

## 2015-06-06 DIAGNOSIS — Z5111 Encounter for antineoplastic chemotherapy: Secondary | ICD-10-CM

## 2015-06-06 DIAGNOSIS — H04203 Unspecified epiphora, bilateral lacrimal glands: Secondary | ICD-10-CM

## 2015-06-06 DIAGNOSIS — C7951 Secondary malignant neoplasm of bone: Secondary | ICD-10-CM

## 2015-06-06 LAB — COMPREHENSIVE METABOLIC PANEL
ALBUMIN: 3.6 g/dL (ref 3.5–5.0)
ALK PHOS: 69 U/L (ref 40–150)
ALT: 14 U/L (ref 0–55)
AST: 12 U/L (ref 5–34)
Anion Gap: 12 mEq/L — ABNORMAL HIGH (ref 3–11)
BILIRUBIN TOTAL: 0.31 mg/dL (ref 0.20–1.20)
BUN: 15 mg/dL (ref 7.0–26.0)
CO2: 23 mEq/L (ref 22–29)
Calcium: 9.7 mg/dL (ref 8.4–10.4)
Chloride: 105 mEq/L (ref 98–109)
Creatinine: 0.9 mg/dL (ref 0.7–1.3)
EGFR: 79 mL/min/{1.73_m2} — ABNORMAL LOW (ref >90)
GLUCOSE: 279 mg/dL — AB (ref 70–140)
POTASSIUM: 4.2 meq/L (ref 3.5–5.1)
SODIUM: 140 meq/L (ref 136–145)
TOTAL PROTEIN: 6.9 g/dL (ref 6.4–8.3)

## 2015-06-06 LAB — CBC WITH DIFFERENTIAL/PLATELET
BASO%: 0 % (ref 0.0–2.0)
Basophils Absolute: 0 10*3/uL (ref 0.0–0.1)
EOS%: 0 % (ref 0.0–7.0)
Eosinophils Absolute: 0 10*3/uL (ref 0.0–0.5)
HCT: 34.5 % — ABNORMAL LOW (ref 38.4–49.9)
HEMOGLOBIN: 11.2 g/dL — AB (ref 13.0–17.1)
LYMPH%: 15.5 % (ref 14.0–49.0)
MCH: 30.5 pg (ref 27.2–33.4)
MCHC: 32.5 g/dL (ref 32.0–36.0)
MCV: 93.7 fL (ref 79.3–98.0)
MONO#: 0.1 10*3/uL (ref 0.1–0.9)
MONO%: 2.4 % (ref 0.0–14.0)
NEUT%: 82.1 % — ABNORMAL HIGH (ref 39.0–75.0)
NEUTROS ABS: 4.4 10*3/uL (ref 1.5–6.5)
Platelets: 270 10*3/uL (ref 140–400)
RBC: 3.68 10*6/uL — ABNORMAL LOW (ref 4.20–5.82)
RDW: 20.9 % — AB (ref 11.0–14.6)
WBC: 5.4 10*3/uL (ref 4.0–10.3)
lymph#: 0.8 10*3/uL — ABNORMAL LOW (ref 0.9–3.3)

## 2015-06-06 NOTE — Progress Notes (Signed)
[place] [phone]   [phone]  OFFICE PROGRESS NOTE  [name], MD [place] [address] [place] [address]  DIAGNOSIS: Stage IV (T2b, N2, M1b) non-small cell lung cancer, adenocarcinoma, with negative EGFR mutation, presented with right upper lobe lung mass in addition to pleural based masses, mediastinal lymphadenopathy as well as metastatic disease to the left adrenal, bone and abdominal lymph nodes diagnosed in August 2016.  PRIOR THERAPY:  1) Status post right Pleurx catheter placement under the care of [name] on 03/01/2015. 2) Systemic chemotherapy with carboplatin for AUC of 5 and Alimta 500 MG/M2. First cycle on 03/14/2015. Status post 4 cycles.    CURRENT THERAPY: Systemic chemotherapy with maintenance chemotherapy with Alimta 500 MG/M2. First cycle on 06/13/2015.  INTERVAL HISTORY: Sean Cox 81 y.o. male returns to the clinic today for follow-up visit. The patient has no significant change since her last visit. He was supposed to start the first cycle of maintenance chemotherapy with single agent Alimta today but he feels tired and would like to have an extra week to recover. He continues to have some blurry vision which is limiting his normal daily function. He denied having any significant fever or chills. He has no nausea or vomiting. The patient denied having any significant chest pain, shortness of breath, cough or hemoptysis. He has few issues today including his visual disturbance which could be partially related to his previous treatment was carboplatin and Alimta. He also would like to talk to a Education officer, museum as well as an Optometrist at the [facility] to go over his bill. He denied having any other significant complaints.  MEDICAL HISTORY: Past Medical History  Diagnosis Date  . Depression   . Hypothyroidism   . Hypercholesteremia   . Glaucoma   . Prostate cancer ([place])   . Ulcerative  proctitis ([place])   . DDD (degenerative disc disease), lumbosacral   . Anxiety   . IBS (irritable bowel syndrome)   . Detached retina   . Impaired fasting glucose   . Heart murmur     recently dx'ed with this 4 weeks ago  . Hypertension     no longer on medications  . GERD (gastroesophageal reflux disease)     in his younger years  . Malignant neoplasm of right upper lobe of lung ([place]) 02/14/2015    ALLERGIES:  is allergic to codeine; imuran; remicade; simvastatin; and sulfa antibiotics.  MEDICATIONS:  Current Outpatient Prescriptions  Medication Sig Dispense Refill  . acetaminophen (TYLENOL) 500 MG tablet Take 1,000 mg by mouth every 4 (four) hours as needed for mild pain, moderate pain, fever or headache.    . APRISO 0.375 G 24 hr capsule Take 8 capsules by mouth daily.   11  . atorvastatin (LIPITOR) 20 MG tablet Take 20 mg by mouth daily.   1  . dexamethasone (DECADRON) 4 MG tablet 4 mg po bid the day before, day of and day after chemo. 40 tablet 1  . dorzolamide (TRUSOPT) 2 % ophthalmic solution Place 1 drop into both eyes 3 (three) times daily.  3  . folic acid (FOLVITE) 1 MG tablet Take 1 tablet (1 mg total) by mouth daily. 30 tablet 4  . levothyroxine (SYNTHROID, LEVOTHROID) 25 MCG tablet Take 25 mcg by mouth daily.   1  . magnesium citrate SOLN Take 0.75 Bottles by mouth once.    [name] PARoxetine (PAXIL) 40 MG tablet TAKE 1 TABLET  BY MOUTH DAILY  3  . polyethylene glycol powder (MIRALAX) powder Take 17 g by mouth 2 (two) times daily. 255 g 0  . predniSONE (DELTASONE) 10 MG tablet 1 tablet by mouth daily 30 tablet 1  . prochlorperazine (COMPAZINE) 10 MG tablet Take 1 tablet (10 mg total) by mouth every 6 (six) hours as needed for nausea or vomiting. 30 tablet 0  . senna (SENOKOT) 8.6 MG tablet Take 1 tablet by mouth daily as needed for constipation.     . temazepam (RESTORIL) 15 MG capsule Take 15 mg by mouth at bedtime as needed for sleep.    [name] timolol (TIMOPTIC) 0.5 % ophthalmic  solution Place 1 drop into both eyes 3 (three) times daily.  3  . traMADol (ULTRAM) 50 MG tablet Take 1-2 tablets (50-100 mg total) by mouth every 6 (six) hours as needed (pain). 40 tablet 0   No current facility-administered medications for this visit.    SURGICAL HISTORY:  Past Surgical History  Procedure Laterality Date  . Colon surgery    . Prostatectomy      2004- Dr Isabel Caprice  . Tonsillectomy    . Diverticular stricture removed      1992  . Fatty tumor excision      in chest  . Chest tube insertion Right 03/01/2015    Procedure: INSERTION PLEURAL DRAINAGE CATHETER RIGHT CHEST;  Surgeon: Loreli Slot, MD;  Location: [facility] OR;  Service: Thoracic;  Laterality: Right;  . [place] pleurodesis Right 04/26/2015    Procedure: Lurlean Nanny;  Surgeon: Loreli Slot, MD;  Location: [facility] OR;  Service: Thoracic;  Laterality: Right;  . Removal of pleural drainage catheter Right 04/26/2015    Procedure: REMOVAL OF PLEURAL DRAINAGE CATHETER;  Surgeon: Loreli Slot, MD;  Location: [facility] OR;  Service: Thoracic;  Laterality: Right;    REVIEW OF SYSTEMS:  Constitutional: positive for fatigue Eyes: positive for irritation, visual disturbance and Increased lacrimation. Ears, nose, mouth, throat, and face: negative Respiratory: negative Cardiovascular: negative Gastrointestinal: negative Genitourinary:negative Integument/breast: negative Hematologic/lymphatic: negative Musculoskeletal:negative Neurological: negative Behavioral/Psych: negative Endocrine: negative Allergic/Immunologic: negative   PHYSICAL EXAMINATION: General appearance: alert, cooperative, fatigued and no distress Head: Normocephalic, without obvious abnormality, atraumatic Neck: no adenopathy, no JVD, supple, symmetrical, trachea midline and thyroid not enlarged, symmetric, no tenderness/mass/nodules Lymph nodes: Cervical, supraclavicular, and axillary nodes normal. Resp: clear to auscultation bilaterally Back:  symmetric, no curvature. ROM normal. No CVA tenderness. Cardio: regular rate and rhythm, S1, S2 normal, no murmur, click, rub or gallop GI: soft, non-tender; bowel sounds normal; no masses,  no organomegaly Extremities: extremities normal, atraumatic, no cyanosis or edema Neurologic: Alert and oriented X 3, normal strength and tone. Normal symmetric reflexes. Normal coordination and gait  ECOG PERFORMANCE STATUS: 1 - Symptomatic but completely ambulatory  Blood pressure 147/69, pulse 69, temperature 97.9 F (36.6 C), temperature source Oral, resp. rate 18, height 5\' 8"  (1.727 m), weight 183 lb 8 oz (83.235 kg), SpO2 98 %.  LABORATORY DATA: Lab Results  Component Value Date   WBC 5.4 06/06/2015   HGB 11.2* 06/06/2015   HCT 34.5* 06/06/2015   MCV 93.7 06/06/2015   PLT 270 06/06/2015      Chemistry      Component Value Date/Time   NA 140 06/06/2015 1118   NA 140 05/23/2015 1725   K 4.2 06/06/2015 1118   K 4.3 05/23/2015 1725   CL 100* 05/23/2015 1725   CO2 23 06/06/2015 1118   CO2 31 05/23/2015  1725   BUN 15.0 06/06/2015 1118   BUN 28* 05/23/2015 1725   CREATININE 0.9 06/06/2015 1118   CREATININE 0.88 05/23/2015 1725      Component Value Date/Time   CALCIUM 9.7 06/06/2015 1118   CALCIUM 9.6 05/23/2015 1725   ALKPHOS 69 06/06/2015 1118   ALKPHOS 70 05/23/2015 1725   AST 12 06/06/2015 1118   AST 14* 05/23/2015 1725   ALT 14 06/06/2015 1118   ALT 11* 05/23/2015 1725   BILITOT 0.31 06/06/2015 1118   BILITOT 0.5 05/23/2015 1725       RADIOGRAPHIC STUDIES: Dg Chest 2 View  05/16/2015  CLINICAL DATA:  Pleural effusion.  Lung cancer. EXAM: CHEST  2 VIEW COMPARISON:  04/18/2015 FINDINGS: Right upper lobe mass lesion again noted, unchanged. Previously seen right pleural catheter no longer visualized. Small right pleural effusion, stable. Left lung is clear. Heart is normal size. No acute bony abnormality. IMPRESSION: Stable right upper lobe mass. Removal of right basilar chest  tube. No pneumothorax. Stable small right effusion. Electronically Signed   By: Charlett Nose M.D.   On: 05/16/2015 15:23   Ct Chest W Contrast  05/15/2015  CLINICAL DATA:  Lung carcinoma diagnosed 2016. Chemotherapy ongoing. Personal history of prostate cancer and colorectal carcinoma. EXAM: CT CHEST, ABDOMEN, AND PELVIS WITH CONTRAST TECHNIQUE: Multidetector CT imaging of the chest, abdomen and pelvis was performed following the standard protocol during bolus administration of intravenous contrast. CONTRAST:  OMNIPAQUE IOHEXOL 300 MG/ML  SOLN COMPARISON:  PET-CT 02/16/2015 FINDINGS: CT CHEST FINDINGS Mediastinum/Nodes: No axillary or supraclavicular adenopathy. Interval reduction in size of mediastinal lymph nodes. For example subcarinal lymph node measures 8 mm decreased from 21 mm. RIGHT hilar lymph node measures 2.5 cm decreased from 3.0 cm. Lungs/Pleura: RIGHT upper lobe suprahilar mass measures 3.6 x 4.3 cm (image 15, series 2) decreased from 5.4 x 4.3 cm on comparison CT scan. Interval reduction in pleural fluid in the RIGHT hemi thorax. Nodule at the LEFT lung base measures 10 mm decreased from 13 mm. No new pulmonary nodules. Musculoskeletal: Sclerotic lesions at T9 and T10 are unchanged. CT ABDOMEN PELVIS FINDINGS Hepatobiliary: No focal hepatic lesion.  Normal gallbladder. Pancreas: Normal pancreatic parenchyma. Spleen: The spleen. Adrenals/Urinary Tract: Thickening of the LEFT adrenal gland to 18 mm not changed. Kidneys, ureters and bladder normal. Stomach/Bowel: Stomach, small bowel, appendix, and cecum are normal. There is anastomosis the proximal sigmoid colon without obstruction or nodularity. Rectum normal. Vascular/Lymphatic: Abdominal aorta is normal caliber. There is no retroperitoneal or periportal lymphadenopathy. No pelvic lymphadenopathy. Reproductive: Post prostatectomy. Musculoskeletal: Stable sclerotic lesions at T9, T10 and L5. IMPRESSION: Chest Impression: 1. Interval decrease  in size of RIGHT upper lobe suprahilar mass. 2. Interval decrease size of mediastinal lymph nodes. 3. Interval decrease in size of LEFT lower lobe pulmonary nodule. 4. Interval decrease in volume of RIGHT pleural effusion. Abdomen / Pelvis Impression: 1. Stable thickening of the LEFT adrenal gland which was hypermetabolic on comparison PET-CT scan. 2. No disease progression the abdomen or pelvis. 3. Stable sclerotic lesions in the lower thoracic spine and lumbar spine. Lesion at T10 and iliac lesions were hypermetabolic on comparison PET-CT scan. Electronically Signed   By: Genevive Bi M.D.   On: 05/15/2015 13:26   Ct Abdomen Pelvis W Contrast  05/15/2015  CLINICAL DATA:  Lung carcinoma diagnosed 2016. Chemotherapy ongoing. Personal history of prostate cancer and colorectal carcinoma. EXAM: CT CHEST, ABDOMEN, AND PELVIS WITH CONTRAST TECHNIQUE: Multidetector CT imaging of the chest, abdomen  and pelvis was performed following the standard protocol during bolus administration of intravenous contrast. CONTRAST:  167m OMNIPAQUE IOHEXOL 300 MG/ML  SOLN COMPARISON:  PET-CT 02/16/2015 FINDINGS: CT CHEST FINDINGS Mediastinum/Nodes: No axillary or supraclavicular adenopathy. Interval reduction in size of mediastinal lymph nodes. For example subcarinal lymph node measures 8 mm decreased from 21 mm. RIGHT hilar lymph node measures 2.5 cm decreased from 3.0 cm. Lungs/Pleura: RIGHT upper lobe suprahilar mass measures 3.6 x 4.3 cm (image 15, series 2) decreased from 5.4 x 4.3 cm on comparison CT scan. Interval reduction in pleural fluid in the RIGHT hemi thorax. Nodule at the LEFT lung base measures 10 mm decreased from 13 mm. No new pulmonary nodules. Musculoskeletal: Sclerotic lesions at T9 and T10 are unchanged. CT ABDOMEN PELVIS FINDINGS Hepatobiliary: No focal hepatic lesion.  Normal gallbladder. Pancreas: Normal pancreatic parenchyma. Spleen: The spleen. Adrenals/Urinary Tract: Thickening of the LEFT adrenal gland  to 18 mm not changed. Kidneys, ureters and bladder normal. Stomach/Bowel: Stomach, small bowel, appendix, and cecum are normal. There is anastomosis the proximal sigmoid colon without obstruction or nodularity. Rectum normal. Vascular/Lymphatic: Abdominal aorta is normal caliber. There is no retroperitoneal or periportal lymphadenopathy. No pelvic lymphadenopathy. Reproductive: Post prostatectomy. Musculoskeletal: Stable sclerotic lesions at T9, T10 and L5. IMPRESSION: Chest Impression: 1. Interval decrease in size of RIGHT upper lobe suprahilar mass. 2. Interval decrease size of mediastinal lymph nodes. 3. Interval decrease in size of LEFT lower lobe pulmonary nodule. 4. Interval decrease in volume of RIGHT pleural effusion. Abdomen / Pelvis Impression: 1. Stable thickening of the LEFT adrenal gland which was hypermetabolic on comparison PET-CT scan. 2. No disease progression the abdomen or pelvis. 3. Stable sclerotic lesions in the lower thoracic spine and lumbar spine. Lesion at T10 and iliac lesions were hypermetabolic on comparison PET-CT scan. Electronically Signed   By: SSuzy BouchardM.D.   On: 05/15/2015 13:26   ASSESSMENT AND PLAN: This is a pleasant 81years old white male recently diagnosed with metastatic non-small cell lung cancer, adenocarcinoma with negative EGFR mutation and negative ALK gene translocation.  He is currently undergoing systemic chemotherapy with carboplatin and Alimta status post 4 cycles.  He continues to tolerate his treatment well except for the burning sensation and tears in his eyes in addition to fatigue the second week after his chemotherapy.  Based on his request, we will delay the start of first cycle of maintenance chemotherapy with single agent Alimta to next week. I will arrange for the patient to me today with a social worker and a [place] therapist[place] the [place] issues regarding treatment bills.  The patient was also advised to see his  ophthalmologist for reevaluation of his condition as he has baseline visual disturbance even before starting treatment. I again strongly encouraged the patient to treat all the staff at the [facility] so we can continue to treat him and provide him with good care. The patient indicated that he will do his best to cooperate with the staffs at the [facility] He would come back for follow-up visit in one month for reevaluation with the start of cycle #2. I also addressed CODE STATUS with the patient and he mentioned that he would come with his attorney on a living will and health care power of attorney to his daughter. He was advised to call immediately if he has any concerning symptoms in the interval. The patient voices understanding of current disease status and treatment options and  is in agreement with the current care plan.  All questions were answered. The patient knows to call the clinic with any problems, questions or concerns. We can certainly see the patient much sooner if necessary.  Disclaimer: This note was dictated with voice recognition software. Similar sounding words can inadvertently be transcribed and may not be corrected upon review.

## 2015-06-06 NOTE — Progress Notes (Signed)
[place] Work  Clinical Social Work was referred by staff for assessment of psychosocial needs due to need for additional support.  Clinical Social Worker had planned to meet with patient during his chemo today, but pt ended up not being treated today due to decision to take a break from treatment today. The CSW team will reach out to pt and family and address these concerns accordingly.  Loren Racer, [place] Worker [place]  [place] Phone: [phone] Fax: [phone]

## 2015-06-06 NOTE — Progress Notes (Signed)
Spoke by phone with Lambert Keto, Office of Patient Experience this am.  She asked that I reach to our Social Work department about this patient.  I contacted Loren Racer, one of our Social Workers who will make contact with Mr. Leahy, and assess needs and make appropriate referrals if necessary.

## 2015-06-06 NOTE — Telephone Encounter (Signed)
Refera ll to Education officer, museum per Lew Dawes at [facility]  Patient experience

## 2015-06-06 NOTE — Progress Notes (Signed)
[place] Work    CSW spoke with him and his daughter via phone. Pt was very pleasant with this CSW. Many of their issues are common adjustment to illness concerns, but there are pre-existing, complex psychosocial concerns that are complicating the current situation. Explained role of CSW and how we could assist him and his family currently related to his cancer. Pt was open to meeting with [place] team in person when he returns on Friday, Dec. 30 after visit with the financial advocate. He has some unresolved financial questions, but CSW attempted to educate him re. social security and answer those questions today. He appeared satisfied with my answers. CSW explained his social security amount was based on work history and would not change with new medical changes. This made sense to him. We will address his care questions and other concerns in person.  In a separate phone call to the daughter, [name]; CSW also explained our role and how we could assist, also explained our limitations, as there appear to be ongoing issues with pt's wife. Wife appears to have longstanding issues that impact her ability to be active with patient's care. These do not appear to result in a safety concern for her or patient, but limit her involvement. Per daughter, pt is able to do all of his ADLs currently, no safety concerns in home, wife and pt just do not get along. She has concerns about advance life planning, future caregiving needs and pt's ability to process his emotions. Daughter and patient are open to meet with CSW team in person after their 1330 appt on 12/30  to further address their concerns. Daughter plans to bring pt to this appt and is aware to come to Pt and Family Support Team area after meeting with financial advocate.    Clinical Social Work interventions: Resource education Supportive listening, emotional support  Loren Racer, [place]  [place] Phone:  [phone] Fax: [phone]

## 2015-06-08 ENCOUNTER — Telehealth: Payer: Self-pay | Admitting: *Deleted

## 2015-06-08 ENCOUNTER — Telehealth: Payer: Self-pay | Admitting: Internal Medicine

## 2015-06-08 ENCOUNTER — Other Ambulatory Visit: Payer: Self-pay | Admitting: *Deleted

## 2015-06-08 MED ORDER — FOLIC ACID 1 MG PO TABS: 1.0000 mg | ORAL_TABLET | Freq: Every day | ORAL | Status: DC

## 2015-06-08 NOTE — Telephone Encounter (Signed)
Per staff message and POF I have scheduled appts. Advised scheduler of appts. JMW  

## 2015-06-08 NOTE — Telephone Encounter (Signed)
Lvm advising of appt on 1/5 and asked pt to collect a calendar then.

## 2015-06-09 ENCOUNTER — Encounter: Payer: Self-pay | Admitting: Internal Medicine

## 2015-06-09 ENCOUNTER — Ambulatory Visit: Payer: Medicare Other

## 2015-06-09 ENCOUNTER — Encounter: Payer: Self-pay | Admitting: *Deleted

## 2015-06-09 NOTE — Progress Notes (Signed)
Ms.Wilma called to inform me that Mr.Conant was here and it showed a 1:30 appointment for FA and did I need to see him or have him check in. I didn't have a 1:30 appointment I had scheduled so I asked her to proceed to have him check in. I looked to see if he had any other appointments and I didn't see any. I called Ms.Wilma back and told her if he didn't have any other appointments I could see him. She states he didn't and brought him back. I asked him "How may I help you" and he said I have a 1:30 appointment with you,and you don't know why I am here. I explained to him that we schedule our own appointments so if someone else puts the appointment in and does not notify us, we aren't aware. He became very upset and irate. He was accompanied by his daughter. He finally told me that he needs to know how much he owes and he hasn't received any bills. I reviewed his billing and quoted the amount showing in self-pay. I told him I would have to go into each individual date and print the descriptions for him. He told me there was no need because he could not read them anyway. His daughter states she would read them.I asked him if his plan was a 80/20 plan and he said you don't know anything about me or why I am here. He asked if I knew what his OOP was for the year. I reviewed the response history and told him it was 4700. He told me that was just for here.He asked if the charges includes lab charges and I told him they bill separately. He asked me if we were going to wait til he died and try to get the money. I apologized several times and he told me not to and that this organization was a joke and advertises that they are number 1. His daughter said they all say that Dad, its just advertisement. She told him I was the "billing department." I explained to them that I do not work in the billing department. I am the Financial Advocate and can assist with billing questions or concerns. I remained calm during the conversation  and patient made a payment. I gave him a receipt. Patient remained irate during the entire visit and finally walked out and left his daughter in with me. She then apologized for his behavior and said she hopes he doesn't get put out. She said they had to go see Lauren next. I showed her from downstairs the wall to go behind to get to Lauren's office. She thanked me.

## 2015-06-09 NOTE — Progress Notes (Addendum)
[place] Work  Clinical Social Work was referred by Patient Experience for assessment of psychosocial needs. Patient's daughter Arville Go, called Clinical Social Worker and shared psychosocial situation and concerns regarding patients family situation, patient's behavior, and need for guidance. Clinical Social Worker met with patient and patient's daughter in office to offer support and assess for needs.    Mr. Leitz shared his main concern is "understanding what I owe" and determining what is required of him for payment.  CSW assisted patient by calling [employer] to help patient/family understand patient's benefits.  Patient provided consent for [facility] share information with CSW and daughter.  [employer] reviewed patient's 2016 & 2017 maximum out of pocket for medical and medications in detail.  CSW documented conversation and made copy for patient/family to review as needed.    CSW and patient/family completed healthcare advance directives and will notarize the document at a future visit.  The patient designated his daughter Arville Go as his medical power of attorney and indicated he would not want life prolonging measures in the end of life situations designated in his living will.  Patient's daughter shared her lack of confidence in "making the right decisions".  Patient assured daughter he trusts her to make decisions.  CSW provided brief emotional support and encouraged daughter to further discuss with CSW at a later time.  CSW, patient, and daughter spent remainder of visit discussing patient's family situation and patient's anger issues.  Patient reports he is "angered" by his perspective of "people not doing their job".  CSW discussed importance of appropriate communication and reviewed several positive approaches for healthy communication. Mr. Starlin shared this process has been difficult for him because he is accustomed to "being in control" and feels he is losing his  independence. Patient and daughter shared family situation; per patient/family report, unhealthy communication and possible mental health issues is a chronic issue.  CSW encouraged patient/family to focus on what is within their control at this time- specifically pertaining to extreme conflict with patient's spouse.  CSW highly reccomended patient's daughter receive counseling.  CSW made referral for counseling intern for patient's daughter.  CSW will continue to provide counseling/support for patient as needed.    CSW developed positive rapport with patient/daughter and they were very appreciative of visit. Patient plans to call CSW before next medical appointment to follow up from today's visit and have advance directives notarized.   Clinical Social Work interventions:   -Brief counseling-with focus on anger management, healthy communication, and positive coping skills -Review of resources- mediated conversation with [employer] for insurance education -Completion of advance directives- reviewed patient's wishes, provided education & support to daughter       Total Visit: 110 min  Polo Riley, MSW, LCSW, OSW-C Clinical Social Worker [place] [phone]

## 2015-06-12 ENCOUNTER — Other Ambulatory Visit: Payer: Self-pay | Admitting: Nurse Practitioner

## 2015-06-14 ENCOUNTER — Other Ambulatory Visit: Payer: Self-pay | Admitting: Medical Oncology

## 2015-06-14 ENCOUNTER — Telehealth: Payer: Self-pay | Admitting: *Deleted

## 2015-06-14 ENCOUNTER — Telehealth: Payer: Self-pay | Admitting: Internal Medicine

## 2015-06-14 NOTE — Progress Notes (Signed)
Pt notified of appt in am tomorrow with Dr Julien Nordmann.

## 2015-06-14 NOTE — Telephone Encounter (Signed)
Voicemail: "I am due tomorrow for infusion.  I need to see Dr. Julien Cox tomorrow.  Call me back within thirty minutes."  Called leaving voicemail requesting return call asking if he needs [facility].   Asked how he is feeling, what symptoms he is having with return call.  Eleven minute call.     "I'm about to do something that will affect my life and need to know more from Dr, Sean Cox.  I need thirty minutes for him to pull out my CT scans from before I started chemotherapy, most recent scans of today and tell me what he see's, what needs to be done to treat and maintenance.  What problems will I have under therapy.  Here's where it should lead to and what we'd like to see happen.  If it doesn't happen then what statistically can occur.  Will I live 6 months, 10 months, a year.  If I die in a year I need to do something different.  I'm about to buy a house.  I've paid $40,000 dollars for home health.  Will I have these problems with every treatment.  I can't function, my eye clouds over and legally blind in the other.  I need the doctor to come to a conclusion in his head, complete the appropriate paper work so I do not have to wait for insurance to get help.  I can't do this all myself.  I'll meet with the social worker to help get things in action.  Triage is supposed to make decisions not call at the end of the day." Listened to him ventilate needs and concerns.  Dr. Julien Cox will be notified of his concerns.  A possible morning slot 15 minute slot yet he is scheduled tomorrow for lab 2:00 pm, Infusion at 2:30 pm. "I'm willing to do whatever"  Return number [phone].

## 2015-06-14 NOTE — Telephone Encounter (Signed)
Appt given to pt. for am w Julien Nordmann

## 2015-06-14 NOTE — Telephone Encounter (Signed)
Appointment made per pof and per pof diane is calling the patient

## 2015-06-15 ENCOUNTER — Encounter: Payer: Self-pay | Admitting: Internal Medicine

## 2015-06-15 ENCOUNTER — Encounter: Payer: Self-pay | Admitting: *Deleted

## 2015-06-15 ENCOUNTER — Other Ambulatory Visit (HOSPITAL_BASED_OUTPATIENT_CLINIC_OR_DEPARTMENT_OTHER): Payer: Medicare Other

## 2015-06-15 ENCOUNTER — Ambulatory Visit (HOSPITAL_BASED_OUTPATIENT_CLINIC_OR_DEPARTMENT_OTHER): Payer: Medicare Other | Admitting: Internal Medicine

## 2015-06-15 ENCOUNTER — Ambulatory Visit (HOSPITAL_BASED_OUTPATIENT_CLINIC_OR_DEPARTMENT_OTHER): Payer: Medicare Other

## 2015-06-15 ENCOUNTER — Telehealth: Payer: Self-pay | Admitting: Internal Medicine

## 2015-06-15 ENCOUNTER — Telehealth: Payer: Self-pay | Admitting: Medical Oncology

## 2015-06-15 VITALS — BP 158/97 | HR 92 | Temp 97.5°F | Resp 18

## 2015-06-15 VITALS — BP 160/94 | HR 73 | Temp 97.7°F | Resp 18 | Ht 68.0 in | Wt 181.8 lb

## 2015-06-15 DIAGNOSIS — C7972 Secondary malignant neoplasm of left adrenal gland: Secondary | ICD-10-CM | POA: Diagnosis not present

## 2015-06-15 DIAGNOSIS — C772 Secondary and unspecified malignant neoplasm of intra-abdominal lymph nodes: Secondary | ICD-10-CM | POA: Diagnosis not present

## 2015-06-15 DIAGNOSIS — C3411 Malignant neoplasm of upper lobe, right bronchus or lung: Secondary | ICD-10-CM

## 2015-06-15 DIAGNOSIS — Z5111 Encounter for antineoplastic chemotherapy: Secondary | ICD-10-CM | POA: Diagnosis not present

## 2015-06-15 DIAGNOSIS — C7951 Secondary malignant neoplasm of bone: Secondary | ICD-10-CM

## 2015-06-15 DIAGNOSIS — J90 Pleural effusion, not elsewhere classified: Secondary | ICD-10-CM

## 2015-06-15 DIAGNOSIS — C3491 Malignant neoplasm of unspecified part of right bronchus or lung: Secondary | ICD-10-CM

## 2015-06-15 LAB — CBC WITH DIFFERENTIAL/PLATELET
BASO%: 0 % (ref 0.0–2.0)
Basophils Absolute: 0 10*3/uL (ref 0.0–0.1)
EOS ABS: 0 10*3/uL (ref 0.0–0.5)
EOS%: 0 % (ref 0.0–7.0)
HCT: 37 % — ABNORMAL LOW (ref 38.4–49.9)
HGB: 12.1 g/dL — ABNORMAL LOW (ref 13.0–17.1)
LYMPH%: 23.5 % (ref 14.0–49.0)
MCH: 31.3 pg (ref 27.2–33.4)
MCHC: 32.7 g/dL (ref 32.0–36.0)
MCV: 95.9 fL (ref 79.3–98.0)
MONO#: 0.5 10*3/uL (ref 0.1–0.9)
MONO%: 7.6 % (ref 0.0–14.0)
NEUT#: 4.7 10*3/uL (ref 1.5–6.5)
NEUT%: 68.9 % (ref 39.0–75.0)
PLATELETS: 141 10*3/uL (ref 140–400)
RBC: 3.86 10*6/uL — AB (ref 4.20–5.82)
RDW: 18.5 % — ABNORMAL HIGH (ref 11.0–14.6)
WBC: 6.9 10*3/uL (ref 4.0–10.3)
lymph#: 1.6 10*3/uL (ref 0.9–3.3)

## 2015-06-15 LAB — COMPREHENSIVE METABOLIC PANEL
ALK PHOS: 78 U/L (ref 40–150)
ALT: 14 U/L (ref 0–55)
AST: 13 U/L (ref 5–34)
Albumin: 3.9 g/dL (ref 3.5–5.0)
Anion Gap: 9 mEq/L (ref 3–11)
BUN: 17.5 mg/dL (ref 7.0–26.0)
CO2: 25 meq/L (ref 22–29)
Calcium: 9.8 mg/dL (ref 8.4–10.4)
Chloride: 106 mEq/L (ref 98–109)
Creatinine: 0.9 mg/dL (ref 0.7–1.3)
EGFR: 82 mL/min/{1.73_m2} — AB (ref >90)
GLUCOSE: 126 mg/dL (ref 70–140)
POTASSIUM: 4.5 meq/L (ref 3.5–5.1)
SODIUM: 141 meq/L (ref 136–145)
Total Bilirubin: 0.48 mg/dL (ref 0.20–1.20)
Total Protein: 7.5 g/dL (ref 6.4–8.3)

## 2015-06-15 MED ORDER — SODIUM CHLORIDE 0.9 % IV SOLN
469.0000 mg | Freq: Once | INTRAVENOUS | Status: AC
  Administered 2015-06-15: 470 mg via INTRAVENOUS
  Filled 2015-06-15: qty 47

## 2015-06-15 MED ORDER — SODIUM CHLORIDE 0.9 % IV SOLN
Freq: Once | INTRAVENOUS | Status: AC
  Administered 2015-06-15: 15:00:00 via INTRAVENOUS

## 2015-06-15 MED ORDER — SODIUM CHLORIDE 0.9 % IV SOLN
Freq: Once | INTRAVENOUS | Status: AC
  Administered 2015-06-15: 15:00:00 via INTRAVENOUS
  Filled 2015-06-15: qty 8

## 2015-06-15 MED ORDER — PEMETREXED DISODIUM CHEMO INJECTION 500 MG
500.0000 mg/m2 | Freq: Once | INTRAVENOUS | Status: AC
  Administered 2015-06-15: 1000 mg via INTRAVENOUS
  Filled 2015-06-15: qty 40

## 2015-06-15 NOTE — Progress Notes (Signed)
Oncology Nurse Navigator Documentation  Oncology Nurse Navigator Flowsheets 06/15/2015  Navigator Location [facility]  Navigator Encounter Type Clinic/MDC/spoke with patient today at [facility].  Dr. Julien Nordmann updated him on scans and data patient requested.  He was thankful for the information.   Patient Visit Type Follow-up  Treatment Phase Treatment  Barriers/Navigation Needs Education  Education Other  Time Spent with Patient 15

## 2015-06-15 NOTE — Telephone Encounter (Signed)
Gv pt appts for Jan - Feb.

## 2015-06-15 NOTE — Patient Instructions (Signed)
[place] Discharge Instructions for Patients Receiving Chemotherapy  Today you received the following chemotherapy agents: Alimta and Carboplatin.  To help prevent nausea and vomiting after your treatment, we encourage you to take your nausea medication : Compazine 10 mg every 6 hours as needed.   If you develop nausea and vomiting that is not controlled by your nausea medication, call the clinic.   BELOW ARE SYMPTOMS THAT SHOULD BE REPORTED IMMEDIATELY:  *FEVER GREATER THAN 100.5 F  *CHILLS WITH OR WITHOUT FEVER  NAUSEA AND VOMITING THAT IS NOT CONTROLLED WITH YOUR NAUSEA MEDICATION  *UNUSUAL SHORTNESS OF BREATH  *UNUSUAL BRUISING OR BLEEDING  TENDERNESS IN MOUTH AND THROAT WITH OR WITHOUT PRESENCE OF ULCERS  *URINARY PROBLEMS  *BOWEL PROBLEMS  UNUSUAL RASH Items with * indicate a potential emergency and should be followed up as soon as possible.  Feel free to call the clinic you have any questions or concerns. The clinic phone number is [phone].  Please show the [place] at check-in to the Emergency Department and triage nurse.

## 2015-06-15 NOTE — Progress Notes (Signed)
[place] Social Work  Clinical Social Work met with patient in infusion room today to review patient's home care policy and notarize patient's advance directives.  The patient designated daughter Arville Go as their primary healthcare agent and no secondary agent.  Patient also completed healthcare living will.  Patient does not wish to receive life prolonging measures in the scenarios indicated in the living will.  Clinical Social Worker notarized documents and made copies for patient/family. Clinical Social Worker will send documents to medical records to be scanned into patient's chart. Clinical Social Worker encouraged patient/family to contact with any additional questions or concerns.  Polo Riley, MSW, [place] Worker [facility] [phone]

## 2015-06-15 NOTE — Telephone Encounter (Signed)
I left message with nurse to verify with Dr Wynetta Emery if pt has allergy , intolerance , etc to imuran to reconcile allergy list.

## 2015-06-15 NOTE — Progress Notes (Signed)
[place] [phone]   [phone]  OFFICE PROGRESS NOTE  Sean Shelling, Sean Cox [address] [place] [address]  DIAGNOSIS: Stage IV (T2b, N2, M1b) non-small cell lung cancer, adenocarcinoma, with negative EGFR mutation, presented with right upper lobe lung mass in addition to pleural based masses, mediastinal lymphadenopathy as well as metastatic disease to the left adrenal, bone and abdominal lymph nodes diagnosed in August 2016.  PRIOR THERAPY:  1) Status post right Pleurx catheter placement under the care of [name] on 03/01/2015.   CURRENT THERAPY: Systemic chemotherapy with carboplatin for AUC of 5 and Alimta 500 MG/M2. First cycle on 03/14/2015. Status post 4 cycles.   INTERVAL HISTORY: Sean Cox 82 y.o. male returns to the clinic today for follow-up visit. The patient requested an extended visit with me today to go over his disease, prognosis and treatment options again. He feels a little bit better today with some improvement of the visual disturbance but the patient is legally blind before starting treatment and he is followed by an ophthalmologist. He denied having any significant fever or chills. He has no nausea or vomiting. The patient denied having any significant chest pain, shortness of breath, cough or hemoptysis. He has contacted several times by the social worker at the [facility]. He felt a little bit better after talking to the social worker regarding his Medicare and insurance issues. He still has a lot of financial question regarding his treatment. He will to the financial worker at the [facility] but was not completely satisfied with the answers.  MEDICAL HISTORY: Past Medical History  Diagnosis Date  . Depression   . Hypothyroidism   . Hypercholesteremia   . Glaucoma   . Prostate cancer ([place])   . Ulcerative proctitis ([place])   . DDD (degenerative disc disease), lumbosacral   .  Anxiety   . IBS (irritable bowel syndrome)   . Detached retina   . Impaired fasting glucose   . Heart murmur     recently dx'ed with this 4 weeks ago  . Hypertension     no longer on medications  . GERD (gastroesophageal reflux disease)     in his younger years  . Malignant neoplasm of right upper lobe of lung ([place]) 02/14/2015    ALLERGIES:  is allergic to codeine; imuran; remicade; simvastatin; and sulfa antibiotics.  MEDICATIONS:  Current Outpatient Prescriptions  Medication Sig Dispense Refill  . acetaminophen (TYLENOL) 500 MG tablet Take 1,000 mg by mouth every 4 (four) hours as needed for mild pain, moderate pain, fever or headache.    . APRISO 0.375 G 24 hr capsule Take 8 capsules by mouth daily.   11  . atorvastatin (LIPITOR) 20 MG tablet Take 20 mg by mouth daily.   1  . dexamethasone (DECADRON) 4 MG tablet 4 mg po bid the day before, day of and day after chemo. 40 tablet 1  . dorzolamide (TRUSOPT) 2 % ophthalmic solution Place 1 drop into both eyes 3 (three) times daily.  3  . folic acid (FOLVITE) 1 MG tablet Take 1 tablet (1 mg total) by mouth daily. 30 tablet 4  . levothyroxine (SYNTHROID, LEVOTHROID) 25 MCG tablet Take 25 mcg by mouth daily.   1  . magnesium citrate SOLN Take 0.75 Bottles by mouth once.    [name] PARoxetine (PAXIL) 40 MG tablet TAKE 1 TABLET BY MOUTH DAILY  3  . polyethylene glycol powder (MIRALAX) powder  Take 17 g by mouth 2 (two) times daily. 255 g 0  . predniSONE (DELTASONE) 10 MG tablet 1 tablet by mouth daily 30 tablet 1  . prochlorperazine (COMPAZINE) 10 MG tablet TAKE 1 TABLET(10 MG) BY MOUTH EVERY 6 HOURS AS NEEDED FOR NAUSEA OR VOMITING 30 tablet 0  . senna (SENOKOT) 8.6 MG tablet Take 1 tablet by mouth daily as needed for constipation.     . temazepam (RESTORIL) 15 MG capsule Take 15 mg by mouth at bedtime as needed for sleep.    [name] timolol (TIMOPTIC) 0.5 % ophthalmic solution Place 1 drop into both eyes 3 (three) times daily.  3  . traMADol (ULTRAM) 50  MG tablet Take 1-2 tablets (50-100 mg total) by mouth every 6 (six) hours as needed (pain). 40 tablet 0   No current facility-administered medications for this visit.    SURGICAL HISTORY:  Past Surgical History  Procedure Laterality Date  . Colon surgery    . Prostatectomy      2004- Dr Risa Grill  . Tonsillectomy    . Diverticular stricture removed      1992  . Fatty tumor excision      in chest  . Chest tube insertion Right 03/01/2015    Procedure: INSERTION PLEURAL DRAINAGE CATHETER RIGHT CHEST;  Surgeon: [name], Sean Cox;  Location: [facility];  Service: Thoracic;  Laterality: Right;  . Talc pleurodesis Right 04/26/2015    Procedure: Pietro Cassis;  Surgeon: [name], Sean Cox;  Location: [place];  Service: Thoracic;  Laterality: Right;  . Removal of pleural drainage catheter Right 04/26/2015    Procedure: REMOVAL OF PLEURAL DRAINAGE CATHETER;  Surgeon: [name], Sean Cox;  Location: [place];  Service: Thoracic;  Laterality: Right;    REVIEW OF SYSTEMS:  Constitutional: positive for fatigue Eyes: positive for visual disturbance Ears, nose, mouth, throat, and face: negative Respiratory: negative Cardiovascular: negative Gastrointestinal: negative Genitourinary:negative Integument/breast: negative Hematologic/lymphatic: negative Musculoskeletal:negative Neurological: negative Behavioral/Psych: negative Endocrine: negative Allergic/Immunologic: negative   PHYSICAL EXAMINATION: General appearance: alert, cooperative, fatigued and no distress Head: Normocephalic, without obvious abnormality, atraumatic Neck: no adenopathy, no JVD, supple, symmetrical, trachea midline and thyroid not enlarged, symmetric, no tenderness/mass/nodules Lymph nodes: Cervical, supraclavicular, and axillary nodes normal. Resp: clear to auscultation bilaterally Back: symmetric, no curvature. ROM normal. No CVA tenderness. Cardio: regular rate and rhythm, S1, S2 normal, no murmur, click, rub  or gallop GI: soft, non-tender; bowel sounds normal; no masses,  no organomegaly Extremities: extremities normal, atraumatic, no cyanosis or edema Neurologic: Alert and oriented X 3, normal strength and tone. Normal symmetric reflexes. Normal coordination and gait  ECOG PERFORMANCE STATUS: 1 - Symptomatic but completely ambulatory  Blood pressure 160/94, pulse 73, temperature 97.7 F (36.5 C), temperature source Oral, resp. rate 18, height '5\' 8"'$  (1.727 m), weight 181 lb 12.8 oz (82.464 kg), SpO2 94 %.  LABORATORY DATA: Lab Results  Component Value Date   WBC 5.4 06/06/2015   HGB 11.2* 06/06/2015   HCT 34.5* 06/06/2015   MCV 93.7 06/06/2015   PLT 270 06/06/2015      Chemistry      Component Value Date/Time   NA 140 06/06/2015 1118   NA 140 05/23/2015 1725   K 4.2 06/06/2015 1118   K 4.3 05/23/2015 1725   CL 100* 05/23/2015 1725   CO2 23 06/06/2015 1118   CO2 31 05/23/2015 1725   BUN 15.0 06/06/2015 1118   BUN 28* 05/23/2015 1725   CREATININE 0.9 06/06/2015 1118  CREATININE 0.88 05/23/2015 1725      Component Value Date/Time   CALCIUM 9.7 06/06/2015 1118   CALCIUM 9.6 05/23/2015 1725   ALKPHOS 69 06/06/2015 1118   ALKPHOS 70 05/23/2015 1725   AST 12 06/06/2015 1118   AST 14* 05/23/2015 1725   ALT 14 06/06/2015 1118   ALT 11* 05/23/2015 1725   BILITOT 0.31 06/06/2015 1118   BILITOT 0.5 05/23/2015 1725       RADIOGRAPHIC STUDIES: Dg Chest 2 View  05/16/2015  CLINICAL DATA:  Pleural effusion.  Lung cancer. EXAM: CHEST  2 VIEW COMPARISON:  04/18/2015 FINDINGS: Right upper lobe mass lesion again noted, unchanged. Previously seen right pleural catheter no longer visualized. Small right pleural effusion, stable. Left lung is clear. Heart is normal size. No acute bony abnormality. IMPRESSION: Stable right upper lobe mass. Removal of right basilar chest tube. No pneumothorax. Stable small right effusion. Electronically Signed   By: Rolm Baptise M.D.   On: 05/16/2015 15:23    ASSESSMENT AND PLAN: This is a pleasant 82 years old white male recently diagnosed with metastatic non-small cell lung cancer, adenocarcinoma with negative EGFR mutation and negative ALK gene translocation.  He is currently undergoing systemic chemotherapy with carboplatin and Alimta status post 4 cycles.  He tolerated this treatment well except for the visual disturbance and increased lacrimation. The patient was supposed to start the first cycle of maintenance chemotherapy today but he kept changing his mind about the treatment plan. I had a very lengthy discussion with the patient today about his current disease status, prognosis and treatment options. I reviewed the CT scan images from the time of the diagnosis until the last imaging studies with the patient today. I also discussed with the patient his prognosis with and without maintenance chemotherapy with single agent Alimta. I showed him the survival and progression free survival from the [facility] trial.  I also explained to the patient in details the husband that the issue as well as the one and 2 year survival with and without maintenance chemotherapy. He also asked the question if it would be okay for him to proceed with one or 2 more cycles of induction chemotherapy with carboplatin and Alimta. I explained to the patient that this option is also valid but he would expect to continue having some of his visual disturbance and lacrimation as well as fatigue with the combination chemotherapy. He clearly indicated that he doesn't matter for him to deal with the adverse effect for 1 or 2 more cycles before switching to maintenance chemotherapy. He would like to change his treatment from single agent Alimta today to the combination of carboplatin and Alimta. I honored his request and the change of his chemotherapy to continue with the induction treatment for 1 or 2 more cycles. Regarding his financial issues and insurance, I recommended for the  patient to continue the discussion with the social worker and financial office to get clear answer for his questions. The patient was also advised to see his ophthalmologist for reevaluation of his condition as he has baseline visual disturbance even before starting treatment. I again strongly encouraged the patient to treat all the staff at the [facility] with respect so we can continue to treat him and provide him with good care. The patient indicated that he will do his best to cooperate with the staffs at the [facility]. Unfortunately for so many time the patient has been a little bit would with some of the staff and  earlier today he was rude to a nurse who is trying to help him to get back to his room when he was lost coming back from the bathroom. I also addressed CODE STATUS with the patient and he mentioned that he would come with his attorney on a living will and health care power of attorney to his daughter. He was advised to call immediately if he has any concerning symptoms in the interval. The patient voices understanding of current disease status and treatment options and is in agreement with the current care plan. I spent around 40 minutes of face-to-face counseling with the patient out of the total visit time 50 minutes. I was accompanied by the thoracic navigator during this visit. All questions were answered. The patient knows to call the clinic with any problems, questions or concerns. We can certainly see the patient much sooner if necessary.  Disclaimer: This note was dictated with voice recognition software. Similar sounding words can inadvertently be transcribed and may not be corrected upon review.

## 2015-06-16 ENCOUNTER — Encounter: Payer: Self-pay | Admitting: Internal Medicine

## 2015-06-20 ENCOUNTER — Other Ambulatory Visit: Payer: Medicare Other

## 2015-06-21 ENCOUNTER — Telehealth: Payer: Self-pay | Admitting: *Deleted

## 2015-06-21 NOTE — Telephone Encounter (Signed)
Per POF I have scheduled appt

## 2015-06-21 NOTE — Telephone Encounter (Signed)
Pt called regarding missed lab appt. Pt request to r/s l;abs, verbalized he was having trouble with his vision. This is a recurring issue he has. Discussed with pt I would be glad to call [name] (Daughter)  And request she bring him to his appt on 1/13.  Pt agreed.   Janeth Rase, informed her of pt's lab appt. [name] agreed 330pm would work best for her and confirmed she will bring pt to appt.   Returned call to pt per Joanne's request.   Discussed his lab appt is on Friday 1/13 at 330pm. [name] will pick him up and bring him to [facility] as she has an appt here with Education officer, museum as well. Pt verbalized understanding, thanked me for the call. No further concerns. POF sent

## 2015-06-22 ENCOUNTER — Telehealth: Payer: Self-pay | Admitting: Internal Medicine

## 2015-06-22 NOTE — Telephone Encounter (Signed)
Patient left message stating he was trying to r/s meds. Returned call and left message for patient confirming next appointment for 1/13 and to call back re questions about his appointments, however he should request to speak with MM's nurse if he has questions re his meds.

## 2015-06-23 ENCOUNTER — Other Ambulatory Visit: Payer: Medicare Other

## 2015-06-23 ENCOUNTER — Other Ambulatory Visit (HOSPITAL_BASED_OUTPATIENT_CLINIC_OR_DEPARTMENT_OTHER): Payer: Medicare Other

## 2015-06-23 DIAGNOSIS — C3411 Malignant neoplasm of upper lobe, right bronchus or lung: Secondary | ICD-10-CM | POA: Diagnosis not present

## 2015-06-23 DIAGNOSIS — C3491 Malignant neoplasm of unspecified part of right bronchus or lung: Secondary | ICD-10-CM

## 2015-06-23 LAB — CBC WITH DIFFERENTIAL/PLATELET
BASO%: 0.3 % (ref 0.0–2.0)
Basophils Absolute: 0 10*3/uL (ref 0.0–0.1)
EOS%: 0.7 % (ref 0.0–7.0)
Eosinophils Absolute: 0 10*3/uL (ref 0.0–0.5)
HEMATOCRIT: 35 % — AB (ref 38.4–49.9)
HGB: 11.3 g/dL — ABNORMAL LOW (ref 13.0–17.1)
LYMPH#: 1.2 10*3/uL (ref 0.9–3.3)
LYMPH%: 26.4 % (ref 14.0–49.0)
MCH: 31.1 pg (ref 27.2–33.4)
MCHC: 32.4 g/dL (ref 32.0–36.0)
MCV: 96 fL (ref 79.3–98.0)
MONO#: 0.2 10*3/uL (ref 0.1–0.9)
MONO%: 5 % (ref 0.0–14.0)
NEUT%: 67.6 % (ref 39.0–75.0)
NEUTROS ABS: 3.1 10*3/uL (ref 1.5–6.5)
PLATELETS: 113 10*3/uL — AB (ref 140–400)
RBC: 3.64 10*6/uL — ABNORMAL LOW (ref 4.20–5.82)
RDW: 18.6 % — ABNORMAL HIGH (ref 11.0–14.6)
WBC: 4.5 10*3/uL (ref 4.0–10.3)

## 2015-06-23 LAB — COMPREHENSIVE METABOLIC PANEL
ALBUMIN: 3.7 g/dL (ref 3.5–5.0)
ALK PHOS: 66 U/L (ref 40–150)
ALT: 13 U/L (ref 0–55)
ANION GAP: 8 meq/L (ref 3–11)
AST: 15 U/L (ref 5–34)
BILIRUBIN TOTAL: 0.77 mg/dL (ref 0.20–1.20)
BUN: 19 mg/dL (ref 7.0–26.0)
CALCIUM: 9.5 mg/dL (ref 8.4–10.4)
CO2: 29 mEq/L (ref 22–29)
Chloride: 103 mEq/L (ref 98–109)
Creatinine: 0.9 mg/dL (ref 0.7–1.3)
EGFR: 82 mL/min/{1.73_m2} — AB (ref >90)
GLUCOSE: 112 mg/dL (ref 70–140)
Potassium: 4.1 mEq/L (ref 3.5–5.1)
Sodium: 141 mEq/L (ref 136–145)
TOTAL PROTEIN: 6.7 g/dL (ref 6.4–8.3)

## 2015-06-23 NOTE — Progress Notes (Signed)
[place] Social Work  Clinical Social Work was referred by CSW during counseling visit to review and complete healthcare advance directives.  Clinical Social Worker met with patient and patient's daughter in [place] office and additionally with patient in infusion room.  The patient designated daughter [name] as their primary healthcare agent and no secondary agent.  Patient also completed healthcare living will.    Clinical Social Worker notarized documents and made copies for patient/family. Clinical Social Worker will send documents to medical records to be scanned into patient's chart. Clinical Social Worker encouraged patient/family to contact with any additional questions or concerns.  Polo Riley, MSW, [place] Worker [facility] [phone]

## 2015-06-27 ENCOUNTER — Other Ambulatory Visit (HOSPITAL_BASED_OUTPATIENT_CLINIC_OR_DEPARTMENT_OTHER): Payer: Medicare Other

## 2015-06-27 DIAGNOSIS — C3491 Malignant neoplasm of unspecified part of right bronchus or lung: Secondary | ICD-10-CM

## 2015-06-27 DIAGNOSIS — C3411 Malignant neoplasm of upper lobe, right bronchus or lung: Secondary | ICD-10-CM

## 2015-06-27 LAB — COMPREHENSIVE METABOLIC PANEL
ALT: 12 U/L (ref 0–55)
ANION GAP: 8 meq/L (ref 3–11)
AST: 13 U/L (ref 5–34)
Albumin: 3.8 g/dL (ref 3.5–5.0)
Alkaline Phosphatase: 66 U/L (ref 40–150)
BUN: 18.7 mg/dL (ref 7.0–26.0)
CALCIUM: 9.3 mg/dL (ref 8.4–10.4)
CHLORIDE: 106 meq/L (ref 98–109)
CO2: 29 mEq/L (ref 22–29)
Creatinine: 0.8 mg/dL (ref 0.7–1.3)
EGFR: 84 mL/min/{1.73_m2} — AB (ref >90)
Glucose: 112 mg/dl (ref 70–140)
POTASSIUM: 3.9 meq/L (ref 3.5–5.1)
Sodium: 143 mEq/L (ref 136–145)
Total Bilirubin: 0.33 mg/dL (ref 0.20–1.20)
Total Protein: 6.7 g/dL (ref 6.4–8.3)

## 2015-06-27 LAB — CBC WITH DIFFERENTIAL/PLATELET
BASO%: 0 % (ref 0.0–2.0)
BASOS ABS: 0 10*3/uL (ref 0.0–0.1)
EOS%: 0.3 % (ref 0.0–7.0)
Eosinophils Absolute: 0 10*3/uL (ref 0.0–0.5)
HCT: 33.1 % — ABNORMAL LOW (ref 38.4–49.9)
HEMOGLOBIN: 10.6 g/dL — AB (ref 13.0–17.1)
LYMPH%: 25.5 % (ref 14.0–49.0)
MCH: 31.2 pg (ref 27.2–33.4)
MCHC: 32.2 g/dL (ref 32.0–36.0)
MCV: 96.9 fL (ref 79.3–98.0)
MONO#: 0.5 10*3/uL (ref 0.1–0.9)
MONO%: 9.1 % (ref 0.0–14.0)
NEUT#: 3.3 10*3/uL (ref 1.5–6.5)
NEUT%: 65.1 % (ref 39.0–75.0)
Platelets: 79 10*3/uL — ABNORMAL LOW (ref 140–400)
RBC: 3.41 10*6/uL — ABNORMAL LOW (ref 4.20–5.82)
RDW: 18.4 % — AB (ref 11.0–14.6)
WBC: 5 10*3/uL (ref 4.0–10.3)
lymph#: 1.3 10*3/uL (ref 0.9–3.3)

## 2015-06-29 ENCOUNTER — Other Ambulatory Visit: Payer: Medicare Other

## 2015-07-04 ENCOUNTER — Other Ambulatory Visit (HOSPITAL_BASED_OUTPATIENT_CLINIC_OR_DEPARTMENT_OTHER): Payer: Medicare Other

## 2015-07-04 ENCOUNTER — Encounter: Payer: Self-pay | Admitting: Internal Medicine

## 2015-07-04 ENCOUNTER — Ambulatory Visit: Payer: Medicare Other

## 2015-07-04 ENCOUNTER — Telehealth: Payer: Self-pay | Admitting: Internal Medicine

## 2015-07-04 ENCOUNTER — Encounter: Payer: Self-pay | Admitting: *Deleted

## 2015-07-04 ENCOUNTER — Ambulatory Visit (HOSPITAL_BASED_OUTPATIENT_CLINIC_OR_DEPARTMENT_OTHER): Payer: Medicare Other | Admitting: Internal Medicine

## 2015-07-04 VITALS — BP 148/85 | HR 72 | Temp 97.8°F | Resp 19 | Ht 68.0 in | Wt 181.0 lb

## 2015-07-04 DIAGNOSIS — C3411 Malignant neoplasm of upper lobe, right bronchus or lung: Secondary | ICD-10-CM

## 2015-07-04 DIAGNOSIS — C7972 Secondary malignant neoplasm of left adrenal gland: Secondary | ICD-10-CM | POA: Diagnosis not present

## 2015-07-04 DIAGNOSIS — J90 Pleural effusion, not elsewhere classified: Secondary | ICD-10-CM

## 2015-07-04 DIAGNOSIS — C3491 Malignant neoplasm of unspecified part of right bronchus or lung: Secondary | ICD-10-CM

## 2015-07-04 DIAGNOSIS — C772 Secondary and unspecified malignant neoplasm of intra-abdominal lymph nodes: Secondary | ICD-10-CM | POA: Diagnosis not present

## 2015-07-04 DIAGNOSIS — C7951 Secondary malignant neoplasm of bone: Secondary | ICD-10-CM | POA: Diagnosis not present

## 2015-07-04 DIAGNOSIS — Z5111 Encounter for antineoplastic chemotherapy: Secondary | ICD-10-CM

## 2015-07-04 DIAGNOSIS — H04203 Unspecified epiphora, bilateral lacrimal glands: Secondary | ICD-10-CM

## 2015-07-04 LAB — COMPREHENSIVE METABOLIC PANEL
ALT: 10 U/L (ref 0–55)
ANION GAP: 12 meq/L — AB (ref 3–11)
AST: 13 U/L (ref 5–34)
Albumin: 4.1 g/dL (ref 3.5–5.0)
Alkaline Phosphatase: 66 U/L (ref 40–150)
BILIRUBIN TOTAL: 0.39 mg/dL (ref 0.20–1.20)
BUN: 15.2 mg/dL (ref 7.0–26.0)
CHLORIDE: 103 meq/L (ref 98–109)
CO2: 26 meq/L (ref 22–29)
Calcium: 9.9 mg/dL (ref 8.4–10.4)
Creatinine: 0.9 mg/dL (ref 0.7–1.3)
EGFR: 81 mL/min/{1.73_m2} — AB (ref >90)
Glucose: 170 mg/dl — ABNORMAL HIGH (ref 70–140)
Potassium: 4.2 mEq/L (ref 3.5–5.1)
Sodium: 140 mEq/L (ref 136–145)
Total Protein: 7.3 g/dL (ref 6.4–8.3)

## 2015-07-04 LAB — CBC WITH DIFFERENTIAL/PLATELET
BASO%: 0 % (ref 0.0–2.0)
Basophils Absolute: 0 10*3/uL (ref 0.0–0.1)
EOS ABS: 0 10*3/uL (ref 0.0–0.5)
EOS%: 0 % (ref 0.0–7.0)
HCT: 35 % — ABNORMAL LOW (ref 38.4–49.9)
HGB: 11.4 g/dL — ABNORMAL LOW (ref 13.0–17.1)
LYMPH%: 18.6 % (ref 14.0–49.0)
MCH: 31.6 pg (ref 27.2–33.4)
MCHC: 32.6 g/dL (ref 32.0–36.0)
MCV: 97 fL (ref 79.3–98.0)
MONO#: 0.2 10*3/uL (ref 0.1–0.9)
MONO%: 3.9 % (ref 0.0–14.0)
NEUT#: 3.8 10*3/uL (ref 1.5–6.5)
NEUT%: 77.5 % — AB (ref 39.0–75.0)
PLATELETS: 250 10*3/uL (ref 140–400)
RBC: 3.61 10*6/uL — AB (ref 4.20–5.82)
RDW: 17.4 % — ABNORMAL HIGH (ref 11.0–14.6)
WBC: 4.8 10*3/uL (ref 4.0–10.3)
lymph#: 0.9 10*3/uL (ref 0.9–3.3)

## 2015-07-04 NOTE — Progress Notes (Signed)
Oncology Nurse Navigator Documentation  Oncology Nurse Navigator Flowsheets 07/04/2015  Navigator Location [facility]  Navigator Encounter Type Follow-up Appt/I spoke with patient and daughter today at [facility].  He is complaining of eye swelling and frequent tearing.  He states he is not having any pain at this time.  I updated Dr. Julien Nordmann on eye issues.   Patient Visit Type Follow-up  Treatment Phase Treatment  Barriers/Navigation Needs Education  Acuity Level 2  Time Spent with Patient 15

## 2015-07-04 NOTE — Progress Notes (Signed)
[facility] [phone]   [phone]  OFFICE PROGRESS NOTE  Sean Shelling, MD [place] [address] [place] [address]  DIAGNOSIS: Stage IV (T2b, N2, M1b) non-small cell lung cancer, adenocarcinoma, with negative EGFR mutation, presented with right upper lobe lung mass in addition to pleural based masses, mediastinal lymphadenopathy as well as metastatic disease to the left adrenal, bone and abdominal lymph nodes diagnosed in August 2016.  PRIOR THERAPY:  1) Status post right Pleurx catheter placement under the care of [name] on 03/01/2015.   CURRENT THERAPY: Systemic chemotherapy with carboplatin for AUC of 5 and Alimta 500 MG/M2. First cycle on 03/14/2015. Status post 5 cycles.   INTERVAL HISTORY: Sean Cox 82 y.o. male returns to the clinic today for follow-up visit accompanied by his daughter. He is feeling fine today with no specific complaints except for the visual disturbance and platelet vision that extended for 3 weeks after his last treatment. He denied having any significant fever or chills. He has no nausea or vomiting. The patient denied having any significant chest pain, shortness of breath, cough or hemoptysis. He doesn't think he will be able to proceed with cycle #6 as a scheduled. His wife is also at the emergency department for evaluation of psychiatric issues and she is expected to be [facility]. He is here today for evaluation and discussion of his treatment options.  MEDICAL HISTORY: Past Medical History  Diagnosis Date  . Depression   . Hypothyroidism   . Hypercholesteremia   . Glaucoma   . Prostate cancer ([place])   . Ulcerative proctitis ([name])   . DDD (degenerative disc disease), lumbosacral   . Anxiety   . IBS (irritable bowel syndrome)   . Detached retina   . Impaired fasting glucose   . Heart murmur     recently dx'ed with this 4 weeks ago  . Hypertension     no longer on medications  . GERD (gastroesophageal reflux disease)     in his younger years  . Malignant neoplasm of right upper lobe of lung (Sean Cox) 02/14/2015    ALLERGIES:  is allergic to codeine; imuran; remicade; simvastatin; and sulfa antibiotics.  MEDICATIONS:  Current Outpatient Prescriptions  Medication Sig Dispense Refill  . acetaminophen (TYLENOL) 500 MG tablet Take 1,000 mg by mouth every 4 (four) hours as needed for mild pain, moderate pain, fever or headache.    . APRISO 0.375 G 24 hr capsule Take 8 capsules by mouth daily.   11  . atorvastatin (LIPITOR) 20 MG tablet Take 20 mg by mouth daily.   1  . dexamethasone (DECADRON) 4 MG tablet 4 mg po bid the day before, day of and day after chemo. 40 tablet 1  . dorzolamide (TRUSOPT) 2 % ophthalmic solution Place 1 drop into both eyes 3 (three) times daily.  3  . folic acid (FOLVITE) 1 MG tablet Take 1 tablet (1 mg total) by mouth daily. 30 tablet 4  . levothyroxine (SYNTHROID, LEVOTHROID) 25 MCG tablet Take 25 mcg by mouth daily.   1  . PARoxetine (PAXIL) 40 MG tablet TAKE 1 TABLET BY MOUTH DAILY  3  . prochlorperazine (COMPAZINE) 10 MG tablet TAKE 1 TABLET(10 MG) BY MOUTH EVERY 6 HOURS AS NEEDED FOR NAUSEA OR VOMITING 30 tablet 0  . timolol (TIMOPTIC) 0.5 % ophthalmic solution Place 1 drop into both eyes 3 (three) times daily.  3  . magnesium  citrate SOLN Take 0.75 Bottles by mouth once. Reported on 06/15/2015    . polyethylene glycol powder (MIRALAX) powder Take 17 g by mouth 2 (two) times daily. (Patient not taking: Reported on 06/15/2015) 255 g 0  . predniSONE (DELTASONE) 10 MG tablet 1 tablet by mouth daily (Patient not taking: Reported on 06/15/2015) 30 tablet 1  . senna (SENOKOT) 8.6 MG tablet Take 1 tablet by mouth daily as needed for constipation. Reported on 07/04/2015    . temazepam (RESTORIL) 15 MG capsule Take 15 mg by mouth at bedtime as needed for sleep. Reported on 07/04/2015    . traMADol (ULTRAM) 50 MG tablet Take 1-2  tablets (50-100 mg total) by mouth every 6 (six) hours as needed (pain). (Patient not taking: Reported on 06/15/2015) 40 tablet 0   No current facility-administered medications for this visit.    SURGICAL HISTORY:  Past Surgical History  Procedure Laterality Date  . Colon surgery    . Prostatectomy      2004- Dr Risa Grill  . Tonsillectomy    . Diverticular stricture removed      1992  . Fatty tumor excision      in chest  . Chest tube insertion Right 03/01/2015    Procedure: INSERTION PLEURAL DRAINAGE CATHETER RIGHT CHEST;  Surgeon: [name], MD;  Location: [facility];  Service: Thoracic;  Laterality: Right;  . Talc pleurodesis Right 04/26/2015    Procedure: Pietro Cassis;  Surgeon: [name], MD;  Location: [facility];  Service: Thoracic;  Laterality: Right;  . Removal of pleural drainage catheter Right 04/26/2015    Procedure: REMOVAL OF PLEURAL DRAINAGE CATHETER;  Surgeon: [name], MD;  Location: [facility];  Service: Thoracic;  Laterality: Right;    REVIEW OF SYSTEMS:  Constitutional: positive for fatigue Eyes: positive for visual disturbance Ears, nose, mouth, throat, and face: negative Respiratory: negative Cardiovascular: negative Gastrointestinal: negative Genitourinary:negative Integument/breast: negative Hematologic/lymphatic: negative Musculoskeletal:negative Neurological: negative Behavioral/Psych: negative Endocrine: negative Allergic/Immunologic: negative   PHYSICAL EXAMINATION: General appearance: alert, cooperative, fatigued and no distress Head: Normocephalic, without obvious abnormality, atraumatic Neck: no adenopathy, no JVD, supple, symmetrical, trachea midline and thyroid not enlarged, symmetric, no tenderness/mass/nodules Lymph nodes: Cervical, supraclavicular, and axillary nodes normal. Resp: clear to auscultation bilaterally Back: symmetric, no curvature. ROM normal. No CVA tenderness. Cardio: regular rate and rhythm, S1, S2  normal, no murmur, click, rub or gallop GI: soft, non-tender; bowel sounds normal; no masses,  no organomegaly Extremities: extremities normal, atraumatic, no cyanosis or edema Neurologic: Alert and oriented X 3, normal strength and tone. Normal symmetric reflexes. Normal coordination and gait  ECOG PERFORMANCE STATUS: 1 - Symptomatic but completely ambulatory  Blood pressure 148/85, pulse 72, temperature 97.8 F (36.6 C), temperature source Oral, resp. rate 19, height '5\' 8"'$  (1.727 m), weight 181 lb (82.101 kg), SpO2 95 %.  LABORATORY DATA: Lab Results  Component Value Date   WBC 4.8 07/04/2015   HGB 11.4* 07/04/2015   HCT 35.0* 07/04/2015   MCV 97.0 07/04/2015   PLT 250 07/04/2015      Chemistry      Component Value Date/Time   NA 140 07/04/2015 1309   NA 140 05/23/2015 1725   K 4.2 07/04/2015 1309   K 4.3 05/23/2015 1725   CL 100* 05/23/2015 1725   CO2 26 07/04/2015 1309   CO2 31 05/23/2015 1725   BUN 15.2 07/04/2015 1309   BUN 28* 05/23/2015 1725   CREATININE 0.9 07/04/2015 1309   CREATININE 0.88  05/23/2015 1725      Component Value Date/Time   CALCIUM 9.9 07/04/2015 1309   CALCIUM 9.6 05/23/2015 1725   ALKPHOS 66 07/04/2015 1309   ALKPHOS 70 05/23/2015 1725   AST 13 07/04/2015 1309   AST 14* 05/23/2015 1725   ALT 10 07/04/2015 1309   ALT 11* 05/23/2015 1725   BILITOT 0.39 07/04/2015 1309   BILITOT 0.5 05/23/2015 1725       RADIOGRAPHIC STUDIES: No results found. ASSESSMENT AND PLAN: This is a pleasant 82 years old white male recently diagnosed with metastatic non-small cell lung cancer, adenocarcinoma with negative EGFR mutation and negative ALK gene translocation.  He is currently undergoing systemic chemotherapy with carboplatin and Alimta status post 5 cycles.  He tolerated this treatment well except for the visual disturbance and increased lacrimation. I had a lengthy discussion with the patient and his daughter today about his current disease status and  treatment options. I do think the patient will be able to handle cycle #6 well especially with the persistent visual disturbance. I discussed with the patient discontinuing his treatment at this point. I will repeat CT scan of the chest, abdomen and pelvis in 2 weeks for restaging of his disease. This will also give the patient some time to handle his social situation and recover from the adverse effect of the previous chemotherapy. Depending on the scan results, the patient may benefit from maintenance chemotherapy if he has no evidence for disease progression or I may consider him for treatment with immunotherapy if there is any evidence for disease progression on the upcoming scan. The patient agreed to the current plan. He was advised to call immediately if he has any concerning symptoms in the interval. The patient voices understanding of current disease status and treatment options and is in agreement with the current care plan.  All questions were answered. The patient knows to call the clinic with any problems, questions or concerns. We can certainly see the patient much sooner if necessary.  Disclaimer: This note was dictated with voice recognition software. Similar sounding words can inadvertently be transcribed and may not be corrected upon review.

## 2015-07-04 NOTE — Telephone Encounter (Signed)
per pof to sch pt appt--gave pt contrast-Dr Julien Nordmann said on pof to sch pt 2/9 no openings sch 2/13-gae pt copy of avs-adv pt that central sch will call ot sch scan

## 2015-07-11 ENCOUNTER — Other Ambulatory Visit: Payer: Medicare Other

## 2015-07-14 ENCOUNTER — Telehealth: Payer: Self-pay | Admitting: *Deleted

## 2015-07-14 MED ORDER — PROCHLORPERAZINE MALEATE 10 MG PO TABS: 10.0000 mg | ORAL_TABLET | Freq: Four times a day (QID) | ORAL | Status: DC | PRN

## 2015-07-14 NOTE — Telephone Encounter (Signed)
Fax received, Rx refilled e-scribe

## 2015-07-17 ENCOUNTER — Other Ambulatory Visit: Payer: Self-pay | Admitting: Medical Oncology

## 2015-07-18 ENCOUNTER — Other Ambulatory Visit: Payer: Medicare Other

## 2015-07-18 ENCOUNTER — Ambulatory Visit (HOSPITAL_COMMUNITY)
Admission: RE | Admit: 2015-07-18 | Discharge: 2015-07-18 | Disposition: A | Discharge status: Home / Self Care | Payer: Medicare Other | Source: Ambulatory Visit | Attending: Internal Medicine | Admitting: Internal Medicine

## 2015-07-18 DIAGNOSIS — J910 Malignant pleural effusion: Secondary | ICD-10-CM | POA: Insufficient documentation

## 2015-07-18 DIAGNOSIS — H04203 Unspecified epiphora, bilateral lacrimal glands: Secondary | ICD-10-CM | POA: Diagnosis present

## 2015-07-18 DIAGNOSIS — I2510 Atherosclerotic heart disease of native coronary artery without angina pectoris: Secondary | ICD-10-CM | POA: Diagnosis not present

## 2015-07-18 DIAGNOSIS — I714 Abdominal aortic aneurysm, without rupture: Secondary | ICD-10-CM | POA: Diagnosis not present

## 2015-07-18 DIAGNOSIS — K760 Fatty (change of) liver, not elsewhere classified: Secondary | ICD-10-CM | POA: Diagnosis not present

## 2015-07-18 DIAGNOSIS — Z923 Personal history of irradiation: Secondary | ICD-10-CM | POA: Insufficient documentation

## 2015-07-18 DIAGNOSIS — C3411 Malignant neoplasm of upper lobe, right bronchus or lung: Secondary | ICD-10-CM | POA: Insufficient documentation

## 2015-07-18 DIAGNOSIS — J948 Other specified pleural conditions: Secondary | ICD-10-CM | POA: Diagnosis present

## 2015-07-18 DIAGNOSIS — Z5111 Encounter for antineoplastic chemotherapy: Secondary | ICD-10-CM

## 2015-07-18 DIAGNOSIS — J90 Pleural effusion, not elsewhere classified: Secondary | ICD-10-CM

## 2015-07-18 DIAGNOSIS — C7972 Secondary malignant neoplasm of left adrenal gland: Secondary | ICD-10-CM | POA: Insufficient documentation

## 2015-07-18 DIAGNOSIS — Z8546 Personal history of malignant neoplasm of prostate: Secondary | ICD-10-CM | POA: Diagnosis not present

## 2015-07-18 MED ORDER — IOHEXOL 300 MG/ML  SOLN
100.0000 mL | Freq: Once | INTRAMUSCULAR | Status: AC | PRN
  Administered 2015-07-18: 100 mL via INTRAVENOUS

## 2015-07-21 ENCOUNTER — Other Ambulatory Visit: Payer: Self-pay | Admitting: *Deleted

## 2015-07-21 ENCOUNTER — Telehealth: Payer: Self-pay | Admitting: *Deleted

## 2015-07-21 NOTE — Telephone Encounter (Signed)
Pt called regarding duplicate appts 0/60 and 2/14.  Pt scheduled to see lab/MD 2/13 And Lab/M/infusion 2/14.   Pt has requested to have lab/MD 2/13 and return on 2/14 for infusion.  Asked pt if he would like me to speak with his daughter regarding transportation, pt declined states "I'm driving myself. I'm fine"  No further concerns.  POF to scheduling to cancel lab/MD appt 2/14 per pt request

## 2015-07-24 ENCOUNTER — Other Ambulatory Visit (HOSPITAL_BASED_OUTPATIENT_CLINIC_OR_DEPARTMENT_OTHER): Payer: Medicare Other

## 2015-07-24 ENCOUNTER — Encounter: Payer: Self-pay | Admitting: Internal Medicine

## 2015-07-24 ENCOUNTER — Ambulatory Visit (HOSPITAL_BASED_OUTPATIENT_CLINIC_OR_DEPARTMENT_OTHER): Payer: Medicare Other | Admitting: Internal Medicine

## 2015-07-24 ENCOUNTER — Telehealth: Payer: Self-pay | Admitting: Internal Medicine

## 2015-07-24 VITALS — BP 151/89 | HR 80 | Temp 97.9°F | Resp 18 | Ht 68.0 in | Wt 184.1 lb

## 2015-07-24 DIAGNOSIS — C3411 Malignant neoplasm of upper lobe, right bronchus or lung: Secondary | ICD-10-CM | POA: Diagnosis not present

## 2015-07-24 DIAGNOSIS — Z5111 Encounter for antineoplastic chemotherapy: Secondary | ICD-10-CM

## 2015-07-24 DIAGNOSIS — C772 Secondary and unspecified malignant neoplasm of intra-abdominal lymph nodes: Secondary | ICD-10-CM | POA: Diagnosis not present

## 2015-07-24 DIAGNOSIS — J90 Pleural effusion, not elsewhere classified: Secondary | ICD-10-CM

## 2015-07-24 DIAGNOSIS — H04203 Unspecified epiphora, bilateral lacrimal glands: Secondary | ICD-10-CM

## 2015-07-24 DIAGNOSIS — C7951 Secondary malignant neoplasm of bone: Secondary | ICD-10-CM | POA: Diagnosis not present

## 2015-07-24 DIAGNOSIS — C7972 Secondary malignant neoplasm of left adrenal gland: Secondary | ICD-10-CM | POA: Diagnosis not present

## 2015-07-24 LAB — COMPREHENSIVE METABOLIC PANEL
ALBUMIN: 3.7 g/dL (ref 3.5–5.0)
ALT: 10 U/L (ref 0–55)
ANION GAP: 10 meq/L (ref 3–11)
AST: 13 U/L (ref 5–34)
Alkaline Phosphatase: 69 U/L (ref 40–150)
BUN: 10 mg/dL (ref 7.0–26.0)
CALCIUM: 9.5 mg/dL (ref 8.4–10.4)
CHLORIDE: 106 meq/L (ref 98–109)
CO2: 27 mEq/L (ref 22–29)
Creatinine: 0.9 mg/dL (ref 0.7–1.3)
EGFR: 76 mL/min/{1.73_m2} — ABNORMAL LOW (ref >90)
Glucose: 166 mg/dl — ABNORMAL HIGH (ref 70–140)
POTASSIUM: 3.8 meq/L (ref 3.5–5.1)
Sodium: 143 mEq/L (ref 136–145)
Total Bilirubin: 0.45 mg/dL (ref 0.20–1.20)
Total Protein: 6.7 g/dL (ref 6.4–8.3)

## 2015-07-24 LAB — CBC WITH DIFFERENTIAL/PLATELET
BASO%: 0.3 % (ref 0.0–2.0)
BASOS ABS: 0 10*3/uL (ref 0.0–0.1)
EOS ABS: 0.1 10*3/uL (ref 0.0–0.5)
EOS%: 1.8 % (ref 0.0–7.0)
HEMATOCRIT: 36.6 % — AB (ref 38.4–49.9)
HEMOGLOBIN: 12 g/dL — AB (ref 13.0–17.1)
LYMPH#: 1.7 10*3/uL (ref 0.9–3.3)
LYMPH%: 31.5 % (ref 14.0–49.0)
MCH: 32.5 pg (ref 27.2–33.4)
MCHC: 32.8 g/dL (ref 32.0–36.0)
MCV: 99 fL — AB (ref 79.3–98.0)
MONO#: 0.5 10*3/uL (ref 0.1–0.9)
MONO%: 9.4 % (ref 0.0–14.0)
NEUT#: 3 10*3/uL (ref 1.5–6.5)
NEUT%: 57 % (ref 39.0–75.0)
PLATELETS: 151 10*3/uL (ref 140–400)
RBC: 3.69 10*6/uL — ABNORMAL LOW (ref 4.20–5.82)
RDW: 16.9 % — AB (ref 11.0–14.6)
WBC: 5.4 10*3/uL (ref 4.0–10.3)

## 2015-07-24 NOTE — Progress Notes (Signed)
[facility] [phone]   [phone]  OFFICE PROGRESS NOTE  [name], MD [address]  [place] [address]  DIAGNOSIS: Stage IV (T2b, N2, M1b) non-small cell lung cancer, adenocarcinoma, with negative EGFR mutation, presented with right upper lobe lung mass in addition to pleural based masses, mediastinal lymphadenopathy as well as metastatic disease to the left adrenal, bone and abdominal lymph nodes diagnosed in August 2016.  PRIOR THERAPY:  1) Status post right Pleurx catheter placement under the care of [name] on 03/01/2015. 2) Systemic chemotherapy with carboplatin for AUC of 5 and Alimta 500 MG/M2. First cycle on 03/14/2015. Status post 5 cycles, discontinued secondary to disease progression.    CURRENT THERAPY: Second line treatment with immunotherapy with Nivolumab 240 MG IV every 2 weeks, first dose 08/15/2015.  INTERVAL HISTORY: Sean Cox 82 y.o. male returns to the clinic today for follow-up visit. He is feeling fine today with no specific complaints except for mild fatigue as well as the visual disturbance that has been going on for a while but much better compared to a few weeks ago. He denied having any significant fever or chills. He has no nausea or vomiting. The patient denied having any significant chest pain, shortness of breath, cough or hemoptysis. He had repeat CT scan of the chest, abdomen and pelvis performed recently and he is here for evaluation and discussion of his scan results.  MEDICAL HISTORY: Past Medical History  Diagnosis Date  . Depression   . Hypothyroidism   . Hypercholesteremia   . Glaucoma   . Prostate cancer ([place])   . Ulcerative proctitis ([facility])   . DDD (degenerative disc disease), lumbosacral   . Anxiety   . IBS (irritable bowel syndrome)   . Detached retina   . Impaired fasting glucose   . Heart murmur     recently dx'ed with this 4 weeks ago  . Hypertension       no longer on medications  . GERD (gastroesophageal reflux disease)     in his younger years  . Malignant neoplasm of right upper lobe of lung ([facility]) 02/14/2015    ALLERGIES:  is allergic to codeine; imuran; remicade; simvastatin; and sulfa antibiotics.  MEDICATIONS:  Current Outpatient Prescriptions  Medication Sig Dispense Refill  . acetaminophen (TYLENOL) 500 MG tablet Take 1,000 mg by mouth every 4 (four) hours as needed for mild pain, moderate pain, fever or headache.    . APRISO 0.375 G 24 hr capsule Take 8 capsules by mouth daily.   11  . atorvastatin (LIPITOR) 20 MG tablet Take 20 mg by mouth daily.   1  . dexamethasone (DECADRON) 4 MG tablet 4 mg po bid the day before, day of and day after chemo. 40 tablet 1  . dorzolamide (TRUSOPT) 2 % ophthalmic solution Place 1 drop into both eyes 3 (three) times daily.  3  . folic acid (FOLVITE) 1 MG tablet Take 1 tablet (1 mg total) by mouth daily. 30 tablet 4  . levothyroxine (SYNTHROID, LEVOTHROID) 25 MCG tablet Take 25 mcg by mouth daily.   1  . magnesium citrate SOLN Take 0.75 Bottles by mouth once. Reported on 06/15/2015    . PARoxetine (PAXIL) 40 MG tablet TAKE 1 TABLET BY MOUTH DAILY  3  . timolol (TIMOPTIC) 0.5 % ophthalmic solution Place 1 drop into both eyes 3 (three) times daily. Reported on 07/24/2015  3  . polyethylene glycol powder (MIRALAX)  powder Take 17 g by mouth 2 (two) times daily. (Patient not taking: Reported on 06/15/2015) 255 g 0  . predniSONE (DELTASONE) 10 MG tablet 1 tablet by mouth daily (Patient not taking: Reported on 06/15/2015) 30 tablet 1  . prochlorperazine (COMPAZINE) 10 MG tablet Take 1 tablet (10 mg total) by mouth every 6 (six) hours as needed for nausea or vomiting. (Patient not taking: Reported on 07/24/2015) 30 tablet 0  . senna (SENOKOT) 8.6 MG tablet Take 1 tablet by mouth daily as needed for constipation. Reported on 07/24/2015    . temazepam (RESTORIL) 15 MG capsule Take 15 mg by mouth at bedtime as needed  for sleep. Reported on 07/24/2015    . traMADol (ULTRAM) 50 MG tablet Take 1-2 tablets (50-100 mg total) by mouth every 6 (six) hours as needed (pain). (Patient not taking: Reported on 06/15/2015) 40 tablet 0   No current facility-administered medications for this visit.    SURGICAL HISTORY:  Past Surgical History  Procedure Laterality Date  . Colon surgery    . Prostatectomy      2004- Dr Risa Grill  . Tonsillectomy    . Diverticular stricture removed      1992  . Fatty tumor excision      in chest  . Chest tube insertion Right 03/01/2015    Procedure: INSERTION PLEURAL DRAINAGE CATHETER RIGHT CHEST;  Surgeon: [name], MD;  Location: [facility];  Service: Thoracic;  Laterality: Right;  . [place] pleurodesis Right 04/26/2015    Procedure: Pietro Cassis;  Surgeon: [name], MD;  Location: [facility];  Service: Thoracic;  Laterality: Right;  . Removal of pleural drainage catheter Right 04/26/2015    Procedure: REMOVAL OF PLEURAL DRAINAGE CATHETER;  Surgeon: [name], MD;  Location: [facility];  Service: Thoracic;  Laterality: Right;    REVIEW OF SYSTEMS:  Constitutional: positive for fatigue Eyes: positive for visual disturbance Ears, nose, mouth, throat, and face: negative Respiratory: negative Cardiovascular: negative Gastrointestinal: negative Genitourinary:negative Integument/breast: negative Hematologic/lymphatic: negative Musculoskeletal:negative Neurological: negative Behavioral/Psych: negative Endocrine: negative Allergic/Immunologic: negative   PHYSICAL EXAMINATION: General appearance: alert, cooperative, fatigued and no distress Head: Normocephalic, without obvious abnormality, atraumatic Neck: no adenopathy, no JVD, supple, symmetrical, trachea midline and thyroid not enlarged, symmetric, no tenderness/mass/nodules Lymph nodes: Cervical, supraclavicular, and axillary nodes normal. Resp: clear to auscultation bilaterally Back: symmetric, no  curvature. ROM normal. No CVA tenderness. Cardio: regular rate and rhythm, S1, S2 normal, no murmur, click, rub or gallop GI: soft, non-tender; bowel sounds normal; no masses,  no organomegaly Extremities: extremities normal, atraumatic, no cyanosis or edema Neurologic: Alert and oriented X 3, normal strength and tone. Normal symmetric reflexes. Normal coordination and gait  ECOG PERFORMANCE STATUS: 1 - Symptomatic but completely ambulatory  Blood pressure 151/89, pulse 80, temperature 97.9 F (36.6 C), temperature source Oral, resp. rate 18, height '5\' 8"'$  (1.727 m), weight 184 lb 1.6 oz (83.507 kg), SpO2 96 %.  LABORATORY DATA: Lab Results  Component Value Date   WBC 5.4 07/24/2015   HGB 12.0* 07/24/2015   HCT 36.6* 07/24/2015   MCV 99.0* 07/24/2015   PLT 151 07/24/2015      Chemistry      Component Value Date/Time   NA 140 07/04/2015 1309   NA 140 05/23/2015 1725   K 4.2 07/04/2015 1309   K 4.3 05/23/2015 1725   CL 100* 05/23/2015 1725   CO2 26 07/04/2015 1309   CO2 31 05/23/2015 1725   BUN 15.2 07/04/2015  1309   BUN 28* 05/23/2015 1725   CREATININE 0.9 07/04/2015 1309   CREATININE 0.88 05/23/2015 1725      Component Value Date/Time   CALCIUM 9.9 07/04/2015 1309   CALCIUM 9.6 05/23/2015 1725   ALKPHOS 66 07/04/2015 1309   ALKPHOS 70 05/23/2015 1725   AST 13 07/04/2015 1309   AST 14* 05/23/2015 1725   ALT 10 07/04/2015 1309   ALT 11* 05/23/2015 1725   BILITOT 0.39 07/04/2015 1309   BILITOT 0.5 05/23/2015 1725       RADIOGRAPHIC STUDIES: Ct Chest W Contrast  07/18/2015  CLINICAL DATA:  82 year old male with history of non-small cell adenocarcinoma of the right upper lobe diagnosed in September 2016, status post chemotherapy (last round completed 4 weeks ago). Additional history of prostate cancer 10 years ago status post prostatectomy. Followup study. EXAM: CT CHEST, ABDOMEN, AND PELVIS WITH CONTRAST TECHNIQUE: Multidetector CT imaging of the chest, abdomen and  pelvis was performed following the standard protocol during bolus administration of intravenous contrast. CONTRAST:  162m OMNIPAQUE IOHEXOL 300 MG/ML  SOLN COMPARISON:  CT of the chest, CT of the chest, abdomen and pelvis 05/15/2015. FINDINGS: CT CHEST FINDINGS Mediastinum/Lymph Nodes: No mediastinal or left hilar lymphadenopathy. Previously noted enlarged right hilar lymph node has decreased in size, currently measuring 14 mm in short axis (previously 18 mm in short axis on 05/15/2015). Heart size is normal. There is no significant pericardial fluid, thickening or pericardial calcification. There is atherosclerosis of the thoracic aorta, the great vessels of the mediastinum and the coronary arteries, including calcified atherosclerotic plaque in the left anterior descending coronary artery. Esophagus is normal in appearance. No axillary lymphadenopathy. Lungs/Pleura: Previously noted right upper lobe suprahilar mass is larger than the prior study, currently measuring 5.1 x 3.8 cm (image 15 of series 4), previously 3.6 x 4.3 cm on prior exam 05/15/2015. This mass continues to have macrolobulated margins and some mild spiculations with extensions to the overlying pleural laterally, and broad-based of contact with the pleura medially. Multiple small pulmonary nodules are again noted throughout the lungs bilaterally, unchanged in size, number and distribution, including 7 mm left lower lobe pulmonary nodules on images 37 and 44 of series 4, and 3 mm pulmonary nodule in the right lower lobe on image 37 of series 4. No new suspicious appearing pulmonary nodules or masses are otherwise noted. Small chronic right-sided pleural effusion is unchanged in size. Pleural calcifications in the inferior aspect of the right hemithorax are unchanged. Musculoskeletal/Soft Tissues: There are no aggressive appearing lytic or blastic lesions noted in the visualized portions of the skeleton. CT ABDOMEN AND PELVIS FINDINGS Hepatobiliary:  Calcified granuloma in the left lobe of the liver. No suspicious cystic or solid hepatic lesions. No intra or extrahepatic biliary ductal dilatation. Gallbladder is normal in appearance. Pancreas: No pancreatic mass. No pancreatic ductal dilatation. No pancreatic or peripancreatic fluid or inflammatory changes. Spleen: Unremarkable. Adrenals/Urinary Tract: Known left adrenal metastasis is similar in size measuring 2.0 x 1.8 cm in the superior aspect of the medial limb. Smaller 1.5 cm nodule in the inferior aspect of the lateral lam is unchanged compared to remote prior examinations dating back to 2006, presumably benign. Right adrenal gland is normal in appearance. Bilateral kidneys are normal in appearance. Urinary bladder is nearly completely decompressed, but otherwise unremarkable in appearance. Stomach/Bowel: Normal appearance of the stomach. No pathologic dilatation of small bowel or colon. Postoperative changes of partial colectomy are noted in the sigmoid colon. No soft tissue lesion  adjacent to the suture line to suggest neoplasm in this region. Normal appendix. Vascular/Lymphatic: Atherosclerosis throughout the abdominal and pelvic vasculature, including mild fusiform aneurysmal dilatation of the infrarenal abdominal aorta which measures up to 3.3 x 3.1 cm. There is also mild aneurysmal dilatation of the right common iliac artery which measures up to 18 mm in diameter. No lymphadenopathy noted in the abdomen or pelvis. Reproductive: Status post radical prostatectomy. Seminal vesicles are unremarkable in appearance. Other: No significant volume of ascites.  No pneumoperitoneum. Musculoskeletal: There are no aggressive appearing lytic or blastic lesions noted in the visualized portions of the skeleton. IMPRESSION: 1. Today's study demonstrates a mixed response to therapy. Specifically, the previously noted right upper lobe mass has slightly increased in size. However, previously noted malignant right hilar  lymph node has decreased in size. Chronic small malignant right pleural effusion is unchanged, as is the known left adrenal metastasis. No new metastatic disease noted elsewhere in the chest, abdomen or pelvis. 2. Multiple tiny pulmonary nodules are stable in size, number and distribution compared to prior examinations. 3. Atherosclerosis, including left anterior descending coronary artery disease. In addition, there is mild aneurysmal dilatation of the infrarenal abdominal aorta which measures up to 3.3 x 3.1 cm, which is unchanged compared to the prior examination. 4. Hepatic steatosis. 5. Additional incidental findings, as above. Electronically Signed   By: Vinnie Langton M.D.   On: 07/18/2015 15:39   Ct Abdomen Pelvis W Contrast  07/18/2015  CLINICAL DATA:  82 year old male with history of non-small cell adenocarcinoma of the right upper lobe diagnosed in September 2016, status post chemotherapy (last round completed 4 weeks ago). Additional history of prostate cancer 10 years ago status post prostatectomy. Followup study. EXAM: CT CHEST, ABDOMEN, AND PELVIS WITH CONTRAST TECHNIQUE: Multidetector CT imaging of the chest, abdomen and pelvis was performed following the standard protocol during bolus administration of intravenous contrast. CONTRAST:  19m OMNIPAQUE IOHEXOL 300 MG/ML  SOLN COMPARISON:  CT of the chest, CT of the chest, abdomen and pelvis 05/15/2015. FINDINGS: CT CHEST FINDINGS Mediastinum/Lymph Nodes: No mediastinal or left hilar lymphadenopathy. Previously noted enlarged right hilar lymph node has decreased in size, currently measuring 14 mm in short axis (previously 18 mm in short axis on 05/15/2015). Heart size is normal. There is no significant pericardial fluid, thickening or pericardial calcification. There is atherosclerosis of the thoracic aorta, the great vessels of the mediastinum and the coronary arteries, including calcified atherosclerotic plaque in the left anterior descending  coronary artery. Esophagus is normal in appearance. No axillary lymphadenopathy. Lungs/Pleura: Previously noted right upper lobe suprahilar mass is larger than the prior study, currently measuring 5.1 x 3.8 cm (image 15 of series 4), previously 3.6 x 4.3 cm on prior exam 05/15/2015. This mass continues to have macrolobulated margins and some mild spiculations with extensions to the overlying pleural laterally, and broad-based of contact with the pleura medially. Multiple small pulmonary nodules are again noted throughout the lungs bilaterally, unchanged in size, number and distribution, including 7 mm left lower lobe pulmonary nodules on images 37 and 44 of series 4, and 3 mm pulmonary nodule in the right lower lobe on image 37 of series 4. No new suspicious appearing pulmonary nodules or masses are otherwise noted. Small chronic right-sided pleural effusion is unchanged in size. Pleural calcifications in the inferior aspect of the right hemithorax are unchanged. Musculoskeletal/Soft Tissues: There are no aggressive appearing lytic or blastic lesions noted in the visualized portions of the skeleton. CT ABDOMEN  AND PELVIS FINDINGS Hepatobiliary: Calcified granuloma in the left lobe of the liver. No suspicious cystic or solid hepatic lesions. No intra or extrahepatic biliary ductal dilatation. Gallbladder is normal in appearance. Pancreas: No pancreatic mass. No pancreatic ductal dilatation. No pancreatic or peripancreatic fluid or inflammatory changes. Spleen: Unremarkable. Adrenals/Urinary Tract: Known left adrenal metastasis is similar in size measuring 2.0 x 1.8 cm in the superior aspect of the medial limb. Smaller 1.5 cm nodule in the inferior aspect of the lateral lam is unchanged compared to remote prior examinations dating back to 2006, presumably benign. Right adrenal gland is normal in appearance. Bilateral kidneys are normal in appearance. Urinary bladder is nearly completely decompressed, but otherwise  unremarkable in appearance. Stomach/Bowel: Normal appearance of the stomach. No pathologic dilatation of small bowel or colon. Postoperative changes of partial colectomy are noted in the sigmoid colon. No soft tissue lesion adjacent to the suture line to suggest neoplasm in this region. Normal appendix. Vascular/Lymphatic: Atherosclerosis throughout the abdominal and pelvic vasculature, including mild fusiform aneurysmal dilatation of the infrarenal abdominal aorta which measures up to 3.3 x 3.1 cm. There is also mild aneurysmal dilatation of the right common iliac artery which measures up to 18 mm in diameter. No lymphadenopathy noted in the abdomen or pelvis. Reproductive: Status post radical prostatectomy. Seminal vesicles are unremarkable in appearance. Other: No significant volume of ascites.  No pneumoperitoneum. Musculoskeletal: There are no aggressive appearing lytic or blastic lesions noted in the visualized portions of the skeleton. IMPRESSION: 1. Today's study demonstrates a mixed response to therapy. Specifically, the previously noted right upper lobe mass has slightly increased in size. However, previously noted malignant right hilar lymph node has decreased in size. Chronic small malignant right pleural effusion is unchanged, as is the known left adrenal metastasis. No new metastatic disease noted elsewhere in the chest, abdomen or pelvis. 2. Multiple tiny pulmonary nodules are stable in size, number and distribution compared to prior examinations. 3. Atherosclerosis, including left anterior descending coronary artery disease. In addition, there is mild aneurysmal dilatation of the infrarenal abdominal aorta which measures up to 3.3 x 3.1 cm, which is unchanged compared to the prior examination. 4. Hepatic steatosis. 5. Additional incidental findings, as above. Electronically Signed   By: Vinnie Langton M.D.   On: 07/18/2015 15:39   ASSESSMENT AND PLAN: This is a pleasant 82 years old white male  recently diagnosed with metastatic non-small cell lung cancer, adenocarcinoma with negative EGFR mutation and negative ALK gene translocation.  He is currently undergoing systemic chemotherapy with carboplatin and Alimta status post 5 cycles.  He tolerated this treatment well except for the visual disturbance and increased lacrimation. The recent CT scan of the chest, abdomen and pelvis showed mixed response with increase in the size of the right upper lobe lung mass but decrease in the size of the malignant right hilar lymph node. I discussed the scan results with the patient today. I recommended for him to consider treatment with second line  immunotherapy with Nivolumab 240 MG IV every 2 weeks because of his recent disease progression. The patient was also given the option of observation and close monitoring. He preferred to proceed with the immunotherapy and he is expected to start the first dose of this treatment on 08/15/2015 because he is moving to a new house on 08/10/2015 and does not want to do any treatment before that time. I discussed with the patient adverse effect of the immunotherapy including but not limited to immune mediated  the skin rash, diarrhea, liver, renal, thyroid or other endocrine dysfunction. He will come back for follow-up visit at that time. The patient agreed to the current plan. He was advised to call immediately if he has any concerning symptoms in the interval. The patient voices understanding of current disease status and treatment options and is in agreement with the current care plan.  All questions were answered. The patient knows to call the clinic with any problems, questions or concerns. We can certainly see the patient much sooner if necessary.  Disclaimer: This note was dictated with voice recognition software. Similar sounding words can inadvertently be transcribed and may not be corrected upon review.

## 2015-07-24 NOTE — Telephone Encounter (Signed)
Spoke with patient to confirm 3/7 appointment per 2/13 pof. Patient will receive updated schedule on next visit

## 2015-07-24 NOTE — Telephone Encounter (Signed)
per pof to sch pt appt-sent Dr Julien Nordmann email to adv no MD appt aopenings-will wait for reply to call pt

## 2015-07-25 ENCOUNTER — Ambulatory Visit: Payer: Medicare Other

## 2015-07-25 ENCOUNTER — Other Ambulatory Visit: Payer: Medicare Other

## 2015-07-25 ENCOUNTER — Ambulatory Visit: Payer: Medicare Other | Admitting: Internal Medicine

## 2015-07-25 ENCOUNTER — Telehealth: Payer: Self-pay | Admitting: Medical Oncology

## 2015-07-25 NOTE — Telephone Encounter (Signed)
Pt called and left message to let  Sean Cox that he has Ulcerative proctitis and scheduled for nivolumab. Mohamed notified.pt did not request a call back.

## 2015-08-01 ENCOUNTER — Other Ambulatory Visit: Payer: Medicare Other

## 2015-08-07 ENCOUNTER — Telehealth: Payer: Self-pay | Admitting: Medical Oncology

## 2015-08-07 NOTE — Telephone Encounter (Signed)
I told pt to discontinue decadron and folic acid.

## 2015-08-07 NOTE — Telephone Encounter (Signed)
Pt called to ask if he continues decadron day before , day of and day after nivolumab.

## 2015-08-15 ENCOUNTER — Ambulatory Visit: Payer: Medicare Other | Admitting: Internal Medicine

## 2015-08-15 ENCOUNTER — Other Ambulatory Visit: Payer: Medicare Other

## 2015-08-15 ENCOUNTER — Telehealth: Payer: Self-pay | Admitting: Internal Medicine

## 2015-08-15 ENCOUNTER — Ambulatory Visit: Payer: Medicare Other

## 2015-08-15 NOTE — Telephone Encounter (Signed)
Patient called re rescheduling 3/7 lab/MM/tx due to he forgot. Rescheduled patient for 3/9 @ 11:45 am lab/LT/tx. Patient aware of new date/time and that he will be seeing LT. Desk nurse informed.

## 2015-08-17 ENCOUNTER — Ambulatory Visit (HOSPITAL_BASED_OUTPATIENT_CLINIC_OR_DEPARTMENT_OTHER): Payer: Medicare Other

## 2015-08-17 ENCOUNTER — Telehealth: Payer: Self-pay | Admitting: Internal Medicine

## 2015-08-17 ENCOUNTER — Other Ambulatory Visit (HOSPITAL_BASED_OUTPATIENT_CLINIC_OR_DEPARTMENT_OTHER): Payer: Medicare Other

## 2015-08-17 ENCOUNTER — Ambulatory Visit (HOSPITAL_BASED_OUTPATIENT_CLINIC_OR_DEPARTMENT_OTHER): Payer: Medicare Other | Admitting: Nurse Practitioner

## 2015-08-17 VITALS — BP 147/79 | HR 79 | Temp 97.8°F | Resp 18 | Ht 68.0 in | Wt 186.7 lb

## 2015-08-17 DIAGNOSIS — Z5112 Encounter for antineoplastic immunotherapy: Secondary | ICD-10-CM | POA: Diagnosis not present

## 2015-08-17 DIAGNOSIS — C3411 Malignant neoplasm of upper lobe, right bronchus or lung: Secondary | ICD-10-CM

## 2015-08-17 DIAGNOSIS — Z79899 Other long term (current) drug therapy: Secondary | ICD-10-CM | POA: Diagnosis not present

## 2015-08-17 DIAGNOSIS — C772 Secondary and unspecified malignant neoplasm of intra-abdominal lymph nodes: Secondary | ICD-10-CM | POA: Diagnosis not present

## 2015-08-17 DIAGNOSIS — C7951 Secondary malignant neoplasm of bone: Secondary | ICD-10-CM | POA: Diagnosis not present

## 2015-08-17 DIAGNOSIS — C3491 Malignant neoplasm of unspecified part of right bronchus or lung: Secondary | ICD-10-CM

## 2015-08-17 DIAGNOSIS — C7972 Secondary malignant neoplasm of left adrenal gland: Secondary | ICD-10-CM

## 2015-08-17 LAB — COMPREHENSIVE METABOLIC PANEL
ANION GAP: 9 meq/L (ref 3–11)
AST: 12 U/L (ref 5–34)
Albumin: 3.8 g/dL (ref 3.5–5.0)
Alkaline Phosphatase: 86 U/L (ref 40–150)
BILIRUBIN TOTAL: 0.46 mg/dL (ref 0.20–1.20)
BUN: 11.6 mg/dL (ref 7.0–26.0)
CO2: 27 meq/L (ref 22–29)
CREATININE: 0.8 mg/dL (ref 0.7–1.3)
Calcium: 9.5 mg/dL (ref 8.4–10.4)
Chloride: 107 mEq/L (ref 98–109)
EGFR: 84 mL/min/{1.73_m2} — ABNORMAL LOW (ref >90)
GLUCOSE: 117 mg/dL (ref 70–140)
Potassium: 3.9 mEq/L (ref 3.5–5.1)
SODIUM: 143 meq/L (ref 136–145)
TOTAL PROTEIN: 7 g/dL (ref 6.4–8.3)

## 2015-08-17 LAB — CBC WITH DIFFERENTIAL/PLATELET
BASO%: 0.7 % (ref 0.0–2.0)
Basophils Absolute: 0 10*3/uL (ref 0.0–0.1)
EOS%: 2.4 % (ref 0.0–7.0)
Eosinophils Absolute: 0.1 10*3/uL (ref 0.0–0.5)
HCT: 38.9 % (ref 38.4–49.9)
HGB: 12.6 g/dL — ABNORMAL LOW (ref 13.0–17.1)
LYMPH%: 28.6 % (ref 14.0–49.0)
MCH: 31.5 pg (ref 27.2–33.4)
MCHC: 32.3 g/dL (ref 32.0–36.0)
MCV: 97.5 fL (ref 79.3–98.0)
MONO#: 0.5 10*3/uL (ref 0.1–0.9)
MONO%: 7.8 % (ref 0.0–14.0)
NEUT%: 60.5 % (ref 39.0–75.0)
NEUTROS ABS: 3.6 10*3/uL (ref 1.5–6.5)
PLATELETS: 188 10*3/uL (ref 140–400)
RBC: 3.99 10*6/uL — AB (ref 4.20–5.82)
RDW: 14.5 % (ref 11.0–14.6)
WBC: 6 10*3/uL (ref 4.0–10.3)
lymph#: 1.7 10*3/uL (ref 0.9–3.3)

## 2015-08-17 LAB — TSH: TSH: 0.845 m(IU)/L (ref 0.320–4.118)

## 2015-08-17 MED ORDER — SODIUM CHLORIDE 0.9 % IV SOLN
Freq: Once | INTRAVENOUS | Status: AC
  Administered 2015-08-17: 13:00:00 via INTRAVENOUS

## 2015-08-17 MED ORDER — NIVOLUMAB CHEMO INJECTION 100 MG/10ML
240.0000 mg | Freq: Once | INTRAVENOUS | Status: AC
  Administered 2015-08-17: 240 mg via INTRAVENOUS
  Filled 2015-08-17: qty 20

## 2015-08-17 NOTE — Telephone Encounter (Signed)
Gave and printed appt sched and avs for pt for march and April

## 2015-08-17 NOTE — Progress Notes (Addendum)
  [facility] OFFICE PROGRESS NOTE   DIAGNOSIS: Stage IV (T2b, N2, M1b) non-small cell lung cancer, adenocarcinoma, with negative EGFR mutation, presented with right upper lobe lung mass in addition to pleural based masses, mediastinal lymphadenopathy as well as metastatic disease to the left adrenal, bone and abdominal lymph nodes diagnosed in August 2016.  PRIOR THERAPY:  1) Status post right Pleurx catheter placement under the care of [name] on 03/01/2015. 2) Systemic chemotherapy with carboplatin for AUC of 5 and Alimta 500 MG/M2. First cycle on 03/14/2015. Status post 5 cycles, discontinued secondary to disease progression.    CURRENT THERAPY: Second line treatment with immunotherapy with Nivolumab 240 MG IV every 2 weeks, first dose 08/17/2015.   INTERVAL HISTORY:   Mr. Sean Cox returns as scheduled. He is seen today prior to beginning treatment with nivolumab. He reports a good appetite. He feels slightly "weak". He denies pain. No nausea or vomiting. He is intermittently constipated. No shortness of breath or cough. No fever.  Objective:  Vital signs in last 24 hours:  Blood pressure 147/79, pulse 79, temperature 97.8 F (36.6 C), temperature source Oral, resp. rate 18, height _0  (1.727 m), weight 186 lb 11.2 oz (84.687 kg), SpO2 95 %.    HEENT: No thrush or ulcers. Resp: Lungs clear bilaterally. Cardio: Regular rate and rhythm. GI: Abdomen soft and nontender. No hepatomegaly. Vascular: No leg edema. Neuro: Alert and oriented.  Skin: No rash.    Lab Results:  Lab Results  Component Value Date   WBC 6.0 08/17/2015   HGB 12.6* 08/17/2015   HCT 38.9 08/17/2015   MCV 97.5 08/17/2015   PLT 188 08/17/2015   NEUTROABS 3.6 08/17/2015    Imaging:  No results found.  Medications: I have reviewed the patient's current medications.  Assessment/Plan: 1. Metastatic non-small cell lung cancer, adenocarcinoma with negative EGFR mutation and  negative ALK gene translocation. He completed systemic chemotherapy with 5 cycles of carboplatin and Alimta 03/14/2015 through 06/15/2015.He tolerated well except for visual disturbance and increased lacrimation. Restaging CT evaluation 07/18/2015 showed a mixed response with increase in the size of the right upper lobe lung mass but decrease in the size of the malignant right hilar lymph node. Decision made to change treatment to nivolumab.   Disposition: Mr. Sean Cox appears stable. The plan is to proceed with cycle 1 nivolumab today as scheduled. We again reviewed potential toxicities. He will return for a follow-up visit and cycle 2 nivolumab in 2 weeks.  Patient seen with Dr. Julien Nordmann.    [name] ANP/GNP-BC   08/17/2015  1:20 PM  ADDENDUM: Hematology/Oncology Attending: I had a face to face encounter with the patient. I recommended his care plan. This is a very pleasant 82 years old white Male with metastatic NSCLC S/P induction systemic chemotherapy with Carboplatin and Alimta. He is here to start first cycle of treatment with immunotherapy with Opdivo. The patient is feeling fine today and has no significant complaints. I recommended for him to proceed with the treatment today as scheduled. He will come back for follow up visit in 2 weeks for evaluation before starting cycle #2.  He was advised to call immediately if he has any concerning symptoms in the interval.  Disclaimer: This note was dictated with voice recognition software. Similar sounding words can inadvertently be transcribed and may be missed upon review. [name]., MD 08/19/2015

## 2015-08-29 ENCOUNTER — Other Ambulatory Visit: Payer: Self-pay | Admitting: *Deleted

## 2015-08-29 DIAGNOSIS — C3411 Malignant neoplasm of upper lobe, right bronchus or lung: Secondary | ICD-10-CM

## 2015-08-30 ENCOUNTER — Encounter: Payer: Self-pay | Admitting: *Deleted

## 2015-08-30 ENCOUNTER — Ambulatory Visit (HOSPITAL_BASED_OUTPATIENT_CLINIC_OR_DEPARTMENT_OTHER): Payer: Medicare Other

## 2015-08-30 ENCOUNTER — Telehealth: Payer: Self-pay | Admitting: Internal Medicine

## 2015-08-30 ENCOUNTER — Other Ambulatory Visit (HOSPITAL_BASED_OUTPATIENT_CLINIC_OR_DEPARTMENT_OTHER): Payer: Medicare Other

## 2015-08-30 ENCOUNTER — Encounter: Payer: Self-pay | Admitting: Internal Medicine

## 2015-08-30 ENCOUNTER — Ambulatory Visit (HOSPITAL_BASED_OUTPATIENT_CLINIC_OR_DEPARTMENT_OTHER): Payer: Medicare Other | Admitting: Internal Medicine

## 2015-08-30 VITALS — BP 126/81 | HR 67 | Temp 97.6°F | Resp 17 | Ht 68.0 in | Wt 185.2 lb

## 2015-08-30 DIAGNOSIS — Z5112 Encounter for antineoplastic immunotherapy: Secondary | ICD-10-CM

## 2015-08-30 DIAGNOSIS — C3411 Malignant neoplasm of upper lobe, right bronchus or lung: Secondary | ICD-10-CM

## 2015-08-30 DIAGNOSIS — C772 Secondary and unspecified malignant neoplasm of intra-abdominal lymph nodes: Secondary | ICD-10-CM

## 2015-08-30 DIAGNOSIS — C7972 Secondary malignant neoplasm of left adrenal gland: Secondary | ICD-10-CM

## 2015-08-30 DIAGNOSIS — C7951 Secondary malignant neoplasm of bone: Secondary | ICD-10-CM

## 2015-08-30 LAB — COMPREHENSIVE METABOLIC PANEL
ALBUMIN: 3.8 g/dL (ref 3.5–5.0)
ALK PHOS: 92 U/L (ref 40–150)
ANION GAP: 9 meq/L (ref 3–11)
AST: 11 U/L (ref 5–34)
BILIRUBIN TOTAL: 0.45 mg/dL (ref 0.20–1.20)
BUN: 12.5 mg/dL (ref 7.0–26.0)
CALCIUM: 9.5 mg/dL (ref 8.4–10.4)
CO2: 28 mEq/L (ref 22–29)
CREATININE: 0.8 mg/dL (ref 0.7–1.3)
Chloride: 105 mEq/L (ref 98–109)
EGFR: 82 mL/min/{1.73_m2} — ABNORMAL LOW (ref >90)
Glucose: 151 mg/dl — ABNORMAL HIGH (ref 70–140)
Potassium: 4 mEq/L (ref 3.5–5.1)
Sodium: 142 mEq/L (ref 136–145)
TOTAL PROTEIN: 7.2 g/dL (ref 6.4–8.3)

## 2015-08-30 LAB — CBC WITH DIFFERENTIAL/PLATELET
BASO%: 0.6 % (ref 0.0–2.0)
Basophils Absolute: 0 10*3/uL (ref 0.0–0.1)
EOS%: 3.6 % (ref 0.0–7.0)
Eosinophils Absolute: 0.2 10*3/uL (ref 0.0–0.5)
HEMATOCRIT: 39.9 % (ref 38.4–49.9)
HEMOGLOBIN: 13 g/dL (ref 13.0–17.1)
LYMPH%: 30.6 % (ref 14.0–49.0)
MCH: 31.4 pg (ref 27.2–33.4)
MCHC: 32.6 g/dL (ref 32.0–36.0)
MCV: 96.5 fL (ref 79.3–98.0)
MONO#: 0.4 10*3/uL (ref 0.1–0.9)
MONO%: 8 % (ref 0.0–14.0)
NEUT#: 3.1 10*3/uL (ref 1.5–6.5)
NEUT%: 57.2 % (ref 39.0–75.0)
PLATELETS: 196 10*3/uL (ref 140–400)
RBC: 4.14 10*6/uL — ABNORMAL LOW (ref 4.20–5.82)
RDW: 14 % (ref 11.0–14.6)
WBC: 5.5 10*3/uL (ref 4.0–10.3)
lymph#: 1.7 10*3/uL (ref 0.9–3.3)

## 2015-08-30 MED ORDER — SODIUM CHLORIDE 0.9 % IV SOLN
240.0000 mg | Freq: Once | INTRAVENOUS | Status: AC
  Administered 2015-08-30: 240 mg via INTRAVENOUS
  Filled 2015-08-30: qty 4

## 2015-08-30 MED ORDER — SODIUM CHLORIDE 0.9 % IV SOLN
Freq: Once | INTRAVENOUS | Status: AC
  Administered 2015-08-30: 13:00:00 via INTRAVENOUS

## 2015-08-30 NOTE — Telephone Encounter (Signed)
Gave patient avs report and appointments for April and May.  °

## 2015-08-30 NOTE — Patient Instructions (Signed)
[facility] Discharge Instructions for Patients Receiving Chemotherapy  Today you received the following chemotherapy agents Opdivo.  To help prevent nausea and vomiting after your treatment, we encourage you to take your nausea medication as directed.   If you develop nausea and vomiting that is not controlled by your nausea medication, call the clinic.   BELOW ARE SYMPTOMS THAT SHOULD BE REPORTED IMMEDIATELY:  *FEVER GREATER THAN 100.5 F  *CHILLS WITH OR WITHOUT FEVER  NAUSEA AND VOMITING THAT IS NOT CONTROLLED WITH YOUR NAUSEA MEDICATION  *UNUSUAL SHORTNESS OF BREATH  *UNUSUAL BRUISING OR BLEEDING  TENDERNESS IN MOUTH AND THROAT WITH OR WITHOUT PRESENCE OF ULCERS  *URINARY PROBLEMS  *BOWEL PROBLEMS  UNUSUAL RASH Items with * indicate a potential emergency and should be followed up as soon as possible.  Feel free to call the clinic you have any questions or concerns. The clinic phone number is [phone].  Please show the CHEMO ALERT CARD at check-in to the Emergency Department and triage nurse.    

## 2015-08-30 NOTE — Progress Notes (Signed)
[place] [phone]   [phone]  OFFICE PROGRESS NOTE  [name], MD [place] [address] [place] [address]  DIAGNOSIS: Stage IV (T2b, N2, M1b) non-small cell lung cancer, adenocarcinoma, with negative EGFR mutation, presented with right upper lobe lung mass in addition to pleural based masses, mediastinal lymphadenopathy as well as metastatic disease to the left adrenal, bone and abdominal lymph nodes diagnosed in August 2016.  PRIOR THERAPY:  1) Status post right Pleurx catheter placement under the care of [name] on 03/01/2015. 2) Systemic chemotherapy with carboplatin for AUC of 5 and Alimta 500 MG/M2. First cycle on 03/14/2015. Status post 5 cycles, discontinued secondary to disease progression.    CURRENT THERAPY: Second line treatment with immunotherapy with Nivolumab 240 MG IV every 2 weeks, first dose 08/15/2015. Status post one cycle  INTERVAL HISTORY: Sean Cox 82 y.o. male returns to the clinic today for follow-up visit. He is feeling fine today with no specific complaints except for mild weakness in his lower extremities. He tolerated the first cycle of his treatment with immunotherapy fairly well with no side effects. He denied having any significant fever or chills. He has no nausea, vomiting, diarrhea but has occasional constipation and uses Senokot. He denied having any significant skin rash.. The patient denied having any significant chest pain, shortness of breath, cough or hemoptysis. He is here today to start cycle #2 of his treatment.  MEDICAL HISTORY: Past Medical History  Diagnosis Date  . Depression   . Hypothyroidism   . Hypercholesteremia   . Glaucoma   . Prostate cancer ([place])   . Ulcerative proctitis ([place])   . DDD (degenerative disc disease), lumbosacral   . Anxiety   . IBS (irritable bowel syndrome)   . Detached retina   . Impaired fasting glucose   . Heart murmur    recently dx'ed with this 4 weeks ago  . Hypertension     no longer on medications  . GERD (gastroesophageal reflux disease)     in his younger years  . Malignant neoplasm of right upper lobe of lung ([place]) 02/14/2015    ALLERGIES:  is allergic to codeine; imuran; remicade; simvastatin; and sulfa antibiotics.  MEDICATIONS:  Current Outpatient Prescriptions  Medication Sig Dispense Refill  . acetaminophen (TYLENOL) 500 MG tablet Take 1,000 mg by mouth every 4 (four) hours as needed for mild pain, moderate pain, fever or headache.    . APRISO 0.375 G 24 hr capsule Take 8 capsules by mouth daily.   11  . atorvastatin (LIPITOR) 20 MG tablet Take 20 mg by mouth daily.   1  . dorzolamide (TRUSOPT) 2 % ophthalmic solution Place 1 drop into both eyes 3 (three) times daily.  3  . folic acid (FOLVITE) 1 MG tablet Take 1 tablet (1 mg total) by mouth daily. (Patient not taking: Reported on 08/17/2015) 30 tablet 4  . levothyroxine (SYNTHROID, LEVOTHROID) 25 MCG tablet Take 25 mcg by mouth daily.   1  . magnesium citrate SOLN Take 0.75 Bottles by mouth once. Reported on 06/15/2015    . PARoxetine (PAXIL) 40 MG tablet TAKE 1 TABLET BY MOUTH DAILY  3  . polyethylene glycol powder (MIRALAX) powder Take 17 g by mouth 2 (two) times daily. (Patient not taking: Reported on 06/15/2015) 255 g 0  . predniSONE (DELTASONE) 10 MG tablet 1 tablet by mouth daily (Patient not taking: Reported on 06/15/2015) 30 tablet 1  .  prochlorperazine (COMPAZINE) 10 MG tablet Take 1 tablet (10 mg total) by mouth every 6 (six) hours as needed for nausea or vomiting. (Patient not taking: Reported on 07/24/2015) 30 tablet 0  . senna (SENOKOT) 8.6 MG tablet Take 1 tablet by mouth daily as needed for constipation. Reported on 07/24/2015    . temazepam (RESTORIL) 15 MG capsule Take 15 mg by mouth at bedtime as needed for sleep. Reported on 07/24/2015    . timolol (TIMOPTIC) 0.5 % ophthalmic solution Place 1 drop into both eyes 3 (three) times daily.  Reported on 07/24/2015  3  . traMADol (ULTRAM) 50 MG tablet Take 1-2 tablets (50-100 mg total) by mouth every 6 (six) hours as needed (pain). (Patient not taking: Reported on 06/15/2015) 40 tablet 0   No current facility-administered medications for this visit.    SURGICAL HISTORY:  Past Surgical History  Procedure Laterality Date  . Colon surgery    . Prostatectomy      2004- Dr Risa Grill  . Tonsillectomy    . Diverticular stricture removed      1992  . Fatty tumor excision      in chest  . Chest tube insertion Right 03/01/2015    Procedure: INSERTION PLEURAL DRAINAGE CATHETER RIGHT CHEST;  Surgeon: [name], MD;  Location: [place];  Service: Thoracic;  Laterality: Right;  . Talc pleurodesis Right 04/26/2015    Procedure: Pietro Cassis;  Surgeon: [name], MD;  Location: [place];  Service: Thoracic;  Laterality: Right;  . Removal of pleural drainage catheter Right 04/26/2015    Procedure: REMOVAL OF PLEURAL DRAINAGE CATHETER;  Surgeon: [name], MD;  Location: [place];  Service: Thoracic;  Laterality: Right;    REVIEW OF SYSTEMS:  A comprehensive review of systems was negative except for: Musculoskeletal: positive for muscle weakness   PHYSICAL EXAMINATION: General appearance: alert, cooperative, fatigued and no distress Head: Normocephalic, without obvious abnormality, atraumatic Neck: no adenopathy, no JVD, supple, symmetrical, trachea midline and thyroid not enlarged, symmetric, no tenderness/mass/nodules Lymph nodes: Cervical, supraclavicular, and axillary nodes normal. Resp: clear to auscultation bilaterally Back: symmetric, no curvature. ROM normal. No CVA tenderness. Cardio: regular rate and rhythm, S1, S2 normal, no murmur, click, rub or gallop GI: soft, non-tender; bowel sounds normal; no masses,  no organomegaly Extremities: extremities normal, atraumatic, no cyanosis or edema Neurologic: Alert and oriented X 3, normal strength and tone. Normal  symmetric reflexes. Normal coordination and gait  ECOG PERFORMANCE STATUS: 1 - Symptomatic but completely ambulatory  Blood pressure 126/81, pulse 67, temperature 97.6 F (36.4 C), temperature source Oral, resp. rate 17, height '5\' 8"'$  (1.727 m), weight 185 lb 3.2 oz (84.006 kg), SpO2 95 %.  LABORATORY DATA: Lab Results  Component Value Date   WBC 5.5 08/30/2015   HGB 13.0 08/30/2015   HCT 39.9 08/30/2015   MCV 96.5 08/30/2015   PLT 196 08/30/2015      Chemistry      Component Value Date/Time   NA 143 08/17/2015 1133   NA 140 05/23/2015 1725   K 3.9 08/17/2015 1133   K 4.3 05/23/2015 1725   CL 100* 05/23/2015 1725   CO2 27 08/17/2015 1133   CO2 31 05/23/2015 1725   BUN 11.6 08/17/2015 1133   BUN 28* 05/23/2015 1725   CREATININE 0.8 08/17/2015 1133   CREATININE 0.88 05/23/2015 1725      Component Value Date/Time   CALCIUM 9.5 08/17/2015 1133   CALCIUM 9.6 05/23/2015 1725  ALKPHOS 86 08/17/2015 1133   ALKPHOS 70 05/23/2015 1725   AST 12 08/17/2015 1133   AST 14* 05/23/2015 1725   ALT <9 08/17/2015 1133   ALT 11* 05/23/2015 1725   BILITOT 0.46 08/17/2015 1133   BILITOT 0.5 05/23/2015 1725       RADIOGRAPHIC STUDIES: No results found. ASSESSMENT AND PLAN: This is a pleasant 82 years old white male recently diagnosed with metastatic non-small cell lung cancer, adenocarcinoma with negative EGFR mutation and negative ALK gene translocation.  He is currently undergoing systemic chemotherapy with carboplatin and Alimta status post 5 cycles.  He tolerated this treatment well except for the visual disturbance and increased lacrimation. The recent CT scan of the chest, abdomen and pelvis showed mixed response with increase in the size of the right upper lobe lung mass but decrease in the size of the malignant right hilar lymph node. The patient is currently undergoing second line treatment with immunotherapy with Nivolumab status post 1 cycle. He tolerated the first cycle of  his treatment fairly well with no significant adverse effects. I recommended for him to proceed with cycle #2 today as a scheduled. The patient would come back for follow-up visit in 2 weeks for reevaluation before starting cycle #3. He was advised to call immediately if he has any concerning symptoms in the interval. The patient voices understanding of current disease status and treatment options and is in agreement with the current care plan.  All questions were answered. The patient knows to call the clinic with any problems, questions or concerns. We can certainly see the patient much sooner if necessary.  Disclaimer: This note was dictated with voice recognition software. Similar sounding words can inadvertently be transcribed and may not be corrected upon review.

## 2015-08-30 NOTE — Progress Notes (Signed)
Oncology Nurse Navigator Documentation  Oncology Nurse Navigator Flowsheets 08/30/2015  Navigator Location [facility]  Navigator Encounter Type Clinic/MDC  Patient Visit Type Follow-up  Treatment Phase Treatment  Barriers/Navigation Needs Education  Education Pain/ Symptom Management  Interventions Education Method  Education Method Verbal  Acuity Level 2  Time Spent with Patient 45   Spoke with Mr. Escue today.  He is doing well.  I listened as he explained.  I asked about side effects of treatment and he stated he did not have any.  I updated him on possible side effects of treatment.  I asked how his family was doing.  Again, I listened as he explained.  Mr. Kesling is doing well with treatment and family is doing well.  He is feeling better and has a more positive outlook today.  I will update social work that Mr. Treiber is doing well.

## 2015-09-11 ENCOUNTER — Other Ambulatory Visit: Payer: Self-pay | Admitting: *Deleted

## 2015-09-11 DIAGNOSIS — C3411 Malignant neoplasm of upper lobe, right bronchus or lung: Secondary | ICD-10-CM

## 2015-09-12 ENCOUNTER — Other Ambulatory Visit (HOSPITAL_BASED_OUTPATIENT_CLINIC_OR_DEPARTMENT_OTHER): Payer: Medicare Other

## 2015-09-12 ENCOUNTER — Ambulatory Visit (HOSPITAL_BASED_OUTPATIENT_CLINIC_OR_DEPARTMENT_OTHER): Payer: Medicare Other

## 2015-09-12 ENCOUNTER — Encounter: Payer: Self-pay | Admitting: Internal Medicine

## 2015-09-12 ENCOUNTER — Ambulatory Visit (HOSPITAL_BASED_OUTPATIENT_CLINIC_OR_DEPARTMENT_OTHER): Payer: Medicare Other | Admitting: Internal Medicine

## 2015-09-12 VITALS — BP 140/87 | HR 83 | Temp 98.2°F | Resp 18 | Ht 68.0 in | Wt 184.9 lb

## 2015-09-12 DIAGNOSIS — C3411 Malignant neoplasm of upper lobe, right bronchus or lung: Secondary | ICD-10-CM

## 2015-09-12 DIAGNOSIS — Z5112 Encounter for antineoplastic immunotherapy: Secondary | ICD-10-CM | POA: Diagnosis not present

## 2015-09-12 DIAGNOSIS — Z79899 Other long term (current) drug therapy: Secondary | ICD-10-CM | POA: Diagnosis not present

## 2015-09-12 DIAGNOSIS — C7951 Secondary malignant neoplasm of bone: Secondary | ICD-10-CM | POA: Diagnosis not present

## 2015-09-12 DIAGNOSIS — C7972 Secondary malignant neoplasm of left adrenal gland: Secondary | ICD-10-CM | POA: Diagnosis not present

## 2015-09-12 DIAGNOSIS — H539 Unspecified visual disturbance: Secondary | ICD-10-CM

## 2015-09-12 DIAGNOSIS — C772 Secondary and unspecified malignant neoplasm of intra-abdominal lymph nodes: Secondary | ICD-10-CM | POA: Diagnosis not present

## 2015-09-12 LAB — CBC WITH DIFFERENTIAL/PLATELET
BASO%: 0.3 % (ref 0.0–2.0)
Basophils Absolute: 0 10*3/uL (ref 0.0–0.1)
EOS%: 1.3 % (ref 0.0–7.0)
Eosinophils Absolute: 0.1 10*3/uL (ref 0.0–0.5)
HCT: 38.6 % (ref 38.4–49.9)
HGB: 12.4 g/dL — ABNORMAL LOW (ref 13.0–17.1)
LYMPH%: 31.8 % (ref 14.0–49.0)
MCH: 30.5 pg (ref 27.2–33.4)
MCHC: 32.2 g/dL (ref 32.0–36.0)
MCV: 94.9 fL (ref 79.3–98.0)
MONO#: 0.5 10*3/uL (ref 0.1–0.9)
MONO%: 9.6 % (ref 0.0–14.0)
NEUT#: 2.9 10*3/uL (ref 1.5–6.5)
NEUT%: 57 % (ref 39.0–75.0)
Platelets: 179 10*3/uL (ref 140–400)
RBC: 4.07 10*6/uL — AB (ref 4.20–5.82)
RDW: 13.6 % (ref 11.0–14.6)
WBC: 5 10*3/uL (ref 4.0–10.3)
lymph#: 1.6 10*3/uL (ref 0.9–3.3)

## 2015-09-12 LAB — COMPREHENSIVE METABOLIC PANEL
ALK PHOS: 87 U/L (ref 40–150)
ANION GAP: 8 meq/L (ref 3–11)
AST: 10 U/L (ref 5–34)
Albumin: 3.6 g/dL (ref 3.5–5.0)
BUN: 11.5 mg/dL (ref 7.0–26.0)
CHLORIDE: 107 meq/L (ref 98–109)
CO2: 29 meq/L (ref 22–29)
CREATININE: 0.8 mg/dL (ref 0.7–1.3)
Calcium: 9.7 mg/dL (ref 8.4–10.4)
EGFR: 83 mL/min/{1.73_m2} — ABNORMAL LOW (ref >90)
Glucose: 137 mg/dl (ref 70–140)
POTASSIUM: 3.9 meq/L (ref 3.5–5.1)
Sodium: 143 mEq/L (ref 136–145)
Total Bilirubin: 0.42 mg/dL (ref 0.20–1.20)
Total Protein: 7.1 g/dL (ref 6.4–8.3)

## 2015-09-12 LAB — TSH: TSH: 0.784 m[IU]/L (ref 0.320–4.118)

## 2015-09-12 MED ORDER — SODIUM CHLORIDE 0.9 % IV SOLN
240.0000 mg | Freq: Once | INTRAVENOUS | Status: AC
  Administered 2015-09-12: 240 mg via INTRAVENOUS
  Filled 2015-09-12: qty 8

## 2015-09-12 MED ORDER — SODIUM CHLORIDE 0.9 % IV SOLN
Freq: Once | INTRAVENOUS | Status: AC
  Administered 2015-09-12: 13:00:00 via INTRAVENOUS

## 2015-09-12 NOTE — Patient Instructions (Signed)
[facility] Discharge Instructions for Patients Receiving Chemotherapy  Today you received the following chemotherapy agents:  Nivolumab.  To help prevent nausea and vomiting after your treatment, we encourage you to take your nausea medication as directed.   If you develop nausea and vomiting that is not controlled by your nausea medication, call the clinic.   BELOW ARE SYMPTOMS THAT SHOULD BE REPORTED IMMEDIATELY:  *FEVER GREATER THAN 100.5 F  *CHILLS WITH OR WITHOUT FEVER  NAUSEA AND VOMITING THAT IS NOT CONTROLLED WITH YOUR NAUSEA MEDICATION  *UNUSUAL SHORTNESS OF BREATH  *UNUSUAL BRUISING OR BLEEDING  TENDERNESS IN MOUTH AND THROAT WITH OR WITHOUT PRESENCE OF ULCERS  *URINARY PROBLEMS  *BOWEL PROBLEMS  UNUSUAL RASH Items with * indicate a potential emergency and should be followed up as soon as possible.  Feel free to call the clinic you have any questions or concerns. The clinic phone number is [phone].  Please show the CHEMO ALERT CARD at check-in to the Emergency Department and triage nurse.   

## 2015-09-12 NOTE — Progress Notes (Signed)
[place] [phone]   [phone]) [phone]  OFFICE PROGRESS NOTE  Sean Shelling, MD [place] [address] [place] [address]  DIAGNOSIS: Stage IV (T2b, N2, M1b) non-small cell lung cancer, adenocarcinoma, with negative EGFR mutation, presented with right upper lobe lung mass in addition to pleural based masses, mediastinal lymphadenopathy as well as metastatic disease to the left adrenal, bone and abdominal lymph nodes diagnosed in August 2016.  PRIOR THERAPY:  1) Status post right Pleurx catheter placement under the care of [name] on 03/01/2015. 2) Systemic chemotherapy with carboplatin for AUC of 5 and Alimta 500 MG/M2. First cycle on 03/14/2015. Status post 5 cycles, discontinued secondary to disease progression.    CURRENT THERAPY: Second line treatment with immunotherapy with Nivolumab 240 MG IV every 2 weeks, first dose 08/15/2015. Status post 2 cycles.  INTERVAL HISTORY: Sean Cox 82 y.o. male returns to the clinic today for follow-up visit. He is feeling fine today with no specific complaints except for persistent mild weakness in his lower extremities. He tolerated the second cycle of his treatment with immunotherapy fairly well with no significant adverse effects. He denied having any significant fever or chills. He has no nausea, vomiting, diarrhea but has occasional constipation and uses Senokot. He denied having any significant skin rash.. The patient denied having any significant chest pain, shortness of breath, cough or hemoptysis. He is here today to start cycle #3 of his treatment.  MEDICAL HISTORY: Past Medical History  Diagnosis Date  . Depression   . Hypothyroidism   . Hypercholesteremia   . Glaucoma   . Prostate cancer ([place])   . Ulcerative proctitis ([place])   . DDD (degenerative disc disease), lumbosacral   . Anxiety   . IBS (irritable bowel syndrome)   . Detached retina   . Impaired fasting glucose     . Heart murmur     recently dx'ed with this 4 weeks ago  . Hypertension     no longer on medications  . GERD (gastroesophageal reflux disease)     in his younger years  . Malignant neoplasm of right upper lobe of lung (Sean Cox) 02/14/2015    ALLERGIES:  is allergic to codeine; imuran; remicade; simvastatin; and sulfa antibiotics.  MEDICATIONS:  Current Outpatient Prescriptions  Medication Sig Dispense Refill  . acetaminophen (TYLENOL) 500 MG tablet Take 1,000 mg by mouth every 4 (four) hours as needed for mild pain, moderate pain, fever or headache.    . APRISO 0.375 G 24 hr capsule Take 8 capsules by mouth daily.   11  . atorvastatin (LIPITOR) 20 MG tablet Take 20 mg by mouth daily.   1  . dorzolamide (TRUSOPT) 2 % ophthalmic solution Place 1 drop into both eyes 3 (three) times daily.  3  . folic acid (FOLVITE) 1 MG tablet Take 1 tablet (1 mg total) by mouth daily. (Patient not taking: Reported on 08/17/2015) 30 tablet 4  . levothyroxine (SYNTHROID, LEVOTHROID) 25 MCG tablet Take 25 mcg by mouth daily.   1  . magnesium citrate SOLN Take 0.75 Bottles by mouth once. Reported on 06/15/2015    . PARoxetine (PAXIL) 40 MG tablet TAKE 1 TABLET BY MOUTH DAILY  3  . polyethylene glycol powder (MIRALAX) powder Take 17 g by mouth 2 (two) times daily. (Patient not taking: Reported on 06/15/2015) 255 g 0  . predniSONE (DELTASONE) 10 MG tablet 1 tablet by mouth daily (Patient not taking: Reported on 06/15/2015)  30 tablet 1  . prochlorperazine (COMPAZINE) 10 MG tablet Take 1 tablet (10 mg total) by mouth every 6 (six) hours as needed for nausea or vomiting. (Patient not taking: Reported on 07/24/2015) 30 tablet 0  . senna (SENOKOT) 8.6 MG tablet Take 1 tablet by mouth daily as needed for constipation. Reported on 08/30/2015    . temazepam (RESTORIL) 15 MG capsule Take 15 mg by mouth at bedtime as needed for sleep. Reported on 08/30/2015    . timolol (TIMOPTIC) 0.5 % ophthalmic solution Place 1 drop into both eyes 3  (three) times daily. Reported on 08/30/2015  3  . traMADol (ULTRAM) 50 MG tablet Take 1-2 tablets (50-100 mg total) by mouth every 6 (six) hours as needed (pain). (Patient not taking: Reported on 06/15/2015) 40 tablet 0   No current facility-administered medications for this visit.    SURGICAL HISTORY:  Past Surgical History  Procedure Laterality Date  . Colon surgery    . Prostatectomy      2004- Dr Risa Grill  . Tonsillectomy    . Diverticular stricture removed      1992  . Fatty tumor excision      in chest  . Chest tube insertion Right 03/01/2015    Procedure: INSERTION PLEURAL DRAINAGE CATHETER RIGHT CHEST;  Surgeon: [name], MD;  Location: [place];  Service: Thoracic;  Laterality: Right;  . Talc pleurodesis Right 04/26/2015    Procedure: Pietro Cassis;  Surgeon: [name], MD;  Location: [place];  Service: Thoracic;  Laterality: Right;  . Removal of pleural drainage catheter Right 04/26/2015    Procedure: REMOVAL OF PLEURAL DRAINAGE CATHETER;  Surgeon: [name], MD;  Location: [place];  Service: Thoracic;  Laterality: Right;    REVIEW OF SYSTEMS:  A comprehensive review of systems was negative except for: Musculoskeletal: positive for muscle weakness   PHYSICAL EXAMINATION: General appearance: alert, cooperative, fatigued and no distress Head: Normocephalic, without obvious abnormality, atraumatic Neck: no adenopathy, no JVD, supple, symmetrical, trachea midline and thyroid not enlarged, symmetric, no tenderness/mass/nodules Lymph nodes: Cervical, supraclavicular, and axillary nodes normal. Resp: clear to auscultation bilaterally Back: symmetric, no curvature. ROM normal. No CVA tenderness. Cardio: regular rate and rhythm, S1, S2 normal, no murmur, click, rub or gallop GI: soft, non-tender; bowel sounds normal; no masses,  no organomegaly Extremities: extremities normal, atraumatic, no cyanosis or edema Neurologic: Alert and oriented X 3, normal  strength and tone. Normal symmetric reflexes. Normal coordination and gait  ECOG PERFORMANCE STATUS: 1 - Symptomatic but completely ambulatory  Blood pressure 140/87, pulse 83, temperature 98.2 F (36.8 C), temperature source Oral, resp. rate 18, height _0  (1.727 m), weight 184 lb 14.4 oz (83.87 kg), SpO2 95 %.  LABORATORY DATA: Lab Results  Component Value Date   WBC 5.0 09/12/2015   HGB 12.4* 09/12/2015   HCT 38.6 09/12/2015   MCV 94.9 09/12/2015   PLT 179 09/12/2015      Chemistry      Component Value Date/Time   NA 142 08/30/2015 1116   NA 140 05/23/2015 1725   K 4.0 08/30/2015 1116   K 4.3 05/23/2015 1725   CL 100* 05/23/2015 1725   CO2 28 08/30/2015 1116   CO2 31 05/23/2015 1725   BUN 12.5 08/30/2015 1116   BUN 28* 05/23/2015 1725   CREATININE 0.8 08/30/2015 1116   CREATININE 0.88 05/23/2015 1725      Component Value Date/Time   CALCIUM 9.5 08/30/2015 1116   CALCIUM  9.6 05/23/2015 1725   ALKPHOS 92 08/30/2015 1116   ALKPHOS 70 05/23/2015 1725   AST 11 08/30/2015 1116   AST 14* 05/23/2015 1725   ALT <9 08/30/2015 1116   ALT 11* 05/23/2015 1725   BILITOT 0.45 08/30/2015 1116   BILITOT 0.5 05/23/2015 1725       RADIOGRAPHIC STUDIES: No results found. ASSESSMENT AND PLAN: This is a pleasant 82 years old white male recently diagnosed with metastatic non-small cell lung cancer, adenocarcinoma with negative EGFR mutation and negative ALK gene translocation.  He is currently undergoing systemic chemotherapy with carboplatin and Alimta status post 5 cycles.  He tolerated this treatment well except for the visual disturbance and increased lacrimation. The recent CT scan of the chest, abdomen and pelvis showed mixed response with increase in the size of the right upper lobe lung mass but decrease in the size of the malignant right hilar lymph node. The patient is currently undergoing second line treatment with immunotherapy with Nivolumab status post 2 cycles. He  tolerated the second cycle of his treatment fairly well with no significant adverse effects. I recommended for him to proceed with cycle #3 today as a scheduled. The patient would come back for follow-up visit in 2 weeks for reevaluation before starting cycle #4. He was advised to call immediately if he has any concerning symptoms in the interval. The patient voices understanding of current disease status and treatment options and is in agreement with the current care plan.  All questions were answered. The patient knows to call the clinic with any problems, questions or concerns. We can certainly see the patient much sooner if necessary.  Disclaimer: This note was dictated with voice recognition software. Similar sounding words can inadvertently be transcribed and may not be corrected upon review.

## 2015-09-25 ENCOUNTER — Other Ambulatory Visit: Payer: Self-pay | Admitting: *Deleted

## 2015-09-25 DIAGNOSIS — C3411 Malignant neoplasm of upper lobe, right bronchus or lung: Secondary | ICD-10-CM

## 2015-09-26 ENCOUNTER — Telehealth: Payer: Self-pay | Admitting: Internal Medicine

## 2015-09-26 ENCOUNTER — Other Ambulatory Visit (HOSPITAL_BASED_OUTPATIENT_CLINIC_OR_DEPARTMENT_OTHER): Payer: Medicare Other

## 2015-09-26 ENCOUNTER — Encounter: Payer: Self-pay | Admitting: *Deleted

## 2015-09-26 ENCOUNTER — Encounter: Payer: Self-pay | Admitting: Internal Medicine

## 2015-09-26 ENCOUNTER — Ambulatory Visit (HOSPITAL_BASED_OUTPATIENT_CLINIC_OR_DEPARTMENT_OTHER): Payer: Medicare Other | Admitting: Internal Medicine

## 2015-09-26 ENCOUNTER — Ambulatory Visit (HOSPITAL_BASED_OUTPATIENT_CLINIC_OR_DEPARTMENT_OTHER): Payer: Medicare Other

## 2015-09-26 VITALS — BP 130/81 | HR 66 | Temp 97.7°F | Resp 17 | Ht 68.0 in | Wt 183.3 lb

## 2015-09-26 DIAGNOSIS — C772 Secondary and unspecified malignant neoplasm of intra-abdominal lymph nodes: Secondary | ICD-10-CM

## 2015-09-26 DIAGNOSIS — J90 Pleural effusion, not elsewhere classified: Secondary | ICD-10-CM

## 2015-09-26 DIAGNOSIS — Z5112 Encounter for antineoplastic immunotherapy: Secondary | ICD-10-CM

## 2015-09-26 DIAGNOSIS — C7972 Secondary malignant neoplasm of left adrenal gland: Secondary | ICD-10-CM

## 2015-09-26 DIAGNOSIS — C3411 Malignant neoplasm of upper lobe, right bronchus or lung: Secondary | ICD-10-CM

## 2015-09-26 DIAGNOSIS — C7951 Secondary malignant neoplasm of bone: Secondary | ICD-10-CM | POA: Diagnosis not present

## 2015-09-26 LAB — COMPREHENSIVE METABOLIC PANEL
ALBUMIN: 3.5 g/dL (ref 3.5–5.0)
ALT: <9 U/L (ref 0–55)
AST: 10 U/L (ref 5–34)
Alkaline Phosphatase: 91 U/L (ref 40–150)
Anion Gap: 9 mEq/L (ref 3–11)
BUN: 16.1 mg/dL (ref 7.0–26.0)
CHLORIDE: 108 meq/L (ref 98–109)
CO2: 26 meq/L (ref 22–29)
Calcium: 9.6 mg/dL (ref 8.4–10.4)
Creatinine: 0.9 mg/dL (ref 0.7–1.3)
EGFR: 80 mL/min/{1.73_m2} — AB (ref >90)
GLUCOSE: 169 mg/dL — AB (ref 70–140)
POTASSIUM: 4.3 meq/L (ref 3.5–5.1)
SODIUM: 142 meq/L (ref 136–145)
Total Bilirubin: 0.4 mg/dL (ref 0.20–1.20)
Total Protein: 7.3 g/dL (ref 6.4–8.3)

## 2015-09-26 LAB — CBC WITH DIFFERENTIAL/PLATELET
BASO%: 0.2 % (ref 0.0–2.0)
BASOS ABS: 0 10*3/uL (ref 0.0–0.1)
EOS%: 0.4 % (ref 0.0–7.0)
Eosinophils Absolute: 0 10*3/uL (ref 0.0–0.5)
HCT: 39.6 % (ref 38.4–49.9)
HEMOGLOBIN: 12.7 g/dL — AB (ref 13.0–17.1)
LYMPH%: 34.5 % (ref 14.0–49.0)
MCH: 30 pg (ref 27.2–33.4)
MCHC: 32.1 g/dL (ref 32.0–36.0)
MCV: 93.5 fL (ref 79.3–98.0)
MONO#: 0.5 10*3/uL (ref 0.1–0.9)
MONO%: 8.7 % (ref 0.0–14.0)
NEUT#: 3.1 10*3/uL (ref 1.5–6.5)
NEUT%: 56.2 % (ref 39.0–75.0)
Platelets: 211 10*3/uL (ref 140–400)
RBC: 4.24 10*6/uL (ref 4.20–5.82)
RDW: 13.6 % (ref 11.0–14.6)
WBC: 5.5 10*3/uL (ref 4.0–10.3)
lymph#: 1.9 10*3/uL (ref 0.9–3.3)

## 2015-09-26 MED ORDER — SODIUM CHLORIDE 0.9 % IV SOLN
Freq: Once | INTRAVENOUS | Status: AC
  Administered 2015-09-26: 13:00:00 via INTRAVENOUS

## 2015-09-26 MED ORDER — SODIUM CHLORIDE 0.9 % IV SOLN
240.0000 mg | Freq: Once | INTRAVENOUS | Status: AC
  Administered 2015-09-26: 240 mg via INTRAVENOUS
  Filled 2015-09-26: qty 8

## 2015-09-26 NOTE — Progress Notes (Signed)
Oncology Nurse Navigator Documentation  Oncology Nurse Navigator Flowsheets 09/26/2015  Navigator Location [facility]  Navigator Encounter Type Clinic/MDC  Patient Visit Type MedOnc  Treatment Phase Treatment  Barriers/Navigation Needs No barriers at this time  Interventions None required  Acuity Level 1  Acuity Level 1 Minimal follow up required  Time Spent with Patient 53   Spoke with Sean Cox today.  He is doing well with his tx.  He does have c/o back pain which Dr. Julien Nordmann addressed.  I asked about how he and his family were doing.  I listened as he explained.  As of now patient states he and family are doing well at this time.  He was in a pleasant mood and it was good to see him doing well.

## 2015-09-26 NOTE — Patient Instructions (Signed)
[facility] Discharge Instructions for Patients Receiving Chemotherapy  Today you received the following chemotherapy agents: Nivolumab.  To help prevent nausea and vomiting after your treatment, we encourage you to take your nausea medication: compazine 10 mg every 6 hours as needed.   If you develop nausea and vomiting that is not controlled by your nausea medication, call the clinic.   BELOW ARE SYMPTOMS THAT SHOULD BE REPORTED IMMEDIATELY:  *FEVER GREATER THAN 100.5 F  *CHILLS WITH OR WITHOUT FEVER  NAUSEA AND VOMITING THAT IS NOT CONTROLLED WITH YOUR NAUSEA MEDICATION  *UNUSUAL SHORTNESS OF BREATH  *UNUSUAL BRUISING OR BLEEDING  TENDERNESS IN MOUTH AND THROAT WITH OR WITHOUT PRESENCE OF ULCERS  *URINARY PROBLEMS  *BOWEL PROBLEMS  UNUSUAL RASH Items with * indicate a potential emergency and should be followed up as soon as possible.  Feel free to call the clinic you have any questions or concerns. The clinic phone number is [phone].  Please show the [place] at check-in to the Emergency Department and triage nurse.

## 2015-09-26 NOTE — Telephone Encounter (Signed)
Gave pt appt for may & avs

## 2015-09-26 NOTE — Progress Notes (Signed)
[place] [phone]   [phone]  OFFICE PROGRESS NOTE  [name], MD [place] [address] [place] [address]  DIAGNOSIS: Stage IV (T2b, N2, M1b) non-small cell lung cancer, adenocarcinoma, with negative EGFR mutation, presented with right upper lobe lung mass in addition to pleural based masses, mediastinal lymphadenopathy as well as metastatic disease to the left adrenal, bone and abdominal lymph nodes diagnosed in August 2016.  PRIOR THERAPY:  1) Status post right Pleurx catheter placement under the care of [name] on 03/01/2015. 2) Systemic chemotherapy with carboplatin for AUC of 5 and Alimta 500 MG/M2. First cycle on 03/14/2015. Status post 5 cycles, discontinued secondary to disease progression.    CURRENT THERAPY: Second line treatment with immunotherapy with Nivolumab 240 MG IV every 2 weeks, first dose 08/15/2015. Status post 3 cycles.  INTERVAL HISTORY: Sean Cox 82 y.o. male returns to the clinic today for follow-up visit. He is feeling fine today with no specific complaints except for mild back pain improved with Tylenol. He tolerated the third cycle of his treatment with immunotherapy fairly well with no significant adverse effects. He denied having any significant fever or chills. He has no nausea, vomiting, diarrhea but has occasional constipation and uses Senokot. He denied having any significant skin rash.. The patient denied having any significant chest pain, shortness of breath, cough or hemoptysis. He is here today to start cycle #4 of his treatment.  MEDICAL HISTORY: Past Medical History  Diagnosis Date  . Depression   . Hypothyroidism   . Hypercholesteremia   . Glaucoma   . Prostate cancer ([place])   . Ulcerative proctitis ([place])   . DDD (degenerative disc disease), lumbosacral   . Anxiety   . IBS (irritable bowel syndrome)   . Detached retina   . Impaired fasting glucose   . Heart  murmur     recently dx'ed with this 4 weeks ago  . Hypertension     no longer on medications  . GERD (gastroesophageal reflux disease)     in his younger years  . Malignant neoplasm of right upper lobe of lung ([place]) 02/14/2015    ALLERGIES:  is allergic to codeine; imuran; remicade; simvastatin; and sulfa antibiotics.  MEDICATIONS:  Current Outpatient Prescriptions  Medication Sig Dispense Refill  . acetaminophen (TYLENOL) 500 MG tablet Take 1,000 mg by mouth every 4 (four) hours as needed for mild pain, moderate pain, fever or headache.    . APRISO 0.375 G 24 hr capsule Take 8 capsules by mouth daily.   11  . atorvastatin (LIPITOR) 20 MG tablet Take 20 mg by mouth daily.   1  . dorzolamide (TRUSOPT) 2 % ophthalmic solution Place 1 drop into both eyes 3 (three) times daily.  3  . levothyroxine (SYNTHROID, LEVOTHROID) 25 MCG tablet Take 25 mcg by mouth daily.   1  . PARoxetine (PAXIL) 40 MG tablet TAKE 1 TABLET BY MOUTH DAILY  3  . timolol (TIMOPTIC) 0.5 % ophthalmic solution Place 1 drop into both eyes 3 (three) times daily. Reported on 08/30/2015  3  . prochlorperazine (COMPAZINE) 10 MG tablet Take 1 tablet (10 mg total) by mouth every 6 (six) hours as needed for nausea or vomiting. (Patient not taking: Reported on 09/26/2015) 30 tablet 0  . senna (SENOKOT) 8.6 MG tablet Take 1 tablet by mouth daily as needed for constipation. Reported on 09/26/2015    . traMADol (ULTRAM) 50 MG tablet Take  1-2 tablets (50-100 mg total) by mouth every 6 (six) hours as needed (pain). (Patient not taking: Reported on 09/26/2015) 40 tablet 0   No current facility-administered medications for this visit.    SURGICAL HISTORY:  Past Surgical History  Procedure Laterality Date  . Colon surgery    . Prostatectomy      2004- Dr Isabel Caprice  . Tonsillectomy    . Diverticular stricture removed      1992  . Fatty tumor excision      in chest  . Chest tube insertion Right 03/01/2015    Procedure: INSERTION PLEURAL  DRAINAGE CATHETER RIGHT CHEST;  Surgeon: Loreli Slot, MD;  Location: [facility] OR;  Service: Thoracic;  Laterality: Right;  . Talc pleurodesis Right 04/26/2015    Procedure: Lurlean Nanny;  Surgeon: Loreli Slot, MD;  Location: [facility] OR;  Service: Thoracic;  Laterality: Right;  . Removal of pleural drainage catheter Right 04/26/2015    Procedure: REMOVAL OF PLEURAL DRAINAGE CATHETER;  Surgeon: Loreli Slot, MD;  Location: [facility] OR;  Service: Thoracic;  Laterality: Right;    REVIEW OF SYSTEMS:  A comprehensive review of systems was negative except for: Musculoskeletal: positive for back pain   PHYSICAL EXAMINATION: General appearance: alert, cooperative, fatigued and no distress Head: Normocephalic, without obvious abnormality, atraumatic Neck: no adenopathy, no JVD, supple, symmetrical, trachea midline and thyroid not enlarged, symmetric, no tenderness/mass/nodules Lymph nodes: Cervical, supraclavicular, and axillary nodes normal. Resp: clear to auscultation bilaterally Back: symmetric, no curvature. ROM normal. No CVA tenderness. Cardio: regular rate and rhythm, S1, S2 normal, no murmur, click, rub or gallop GI: soft, non-tender; bowel sounds normal; no masses,  no organomegaly Extremities: extremities normal, atraumatic, no cyanosis or edema Neurologic: Alert and oriented X 3, normal strength and tone. Normal symmetric reflexes. Normal coordination and gait  ECOG PERFORMANCE STATUS: 1 - Symptomatic but completely ambulatory  Blood pressure 130/81, pulse 66, temperature 97.7 F (36.5 C), temperature source Oral, resp. rate 17, height 5\' 8"  (1.727 m), weight 183 lb 4.8 oz (83.144 kg), SpO2 96 %.  LABORATORY DATA: Lab Results  Component Value Date   WBC 5.5 09/26/2015   HGB 12.7* 09/26/2015   HCT 39.6 09/26/2015   MCV 93.5 09/26/2015   PLT 211 09/26/2015      Chemistry      Component Value Date/Time   NA 143 09/12/2015 1132   NA 140 05/23/2015 1725   K 3.9  09/12/2015 1132   K 4.3 05/23/2015 1725   CL 100* 05/23/2015 1725   CO2 29 09/12/2015 1132   CO2 31 05/23/2015 1725   BUN 11.5 09/12/2015 1132   BUN 28* 05/23/2015 1725   CREATININE 0.8 09/12/2015 1132   CREATININE 0.88 05/23/2015 1725      Component Value Date/Time   CALCIUM 9.7 09/12/2015 1132   CALCIUM 9.6 05/23/2015 1725   ALKPHOS 87 09/12/2015 1132   ALKPHOS 70 05/23/2015 1725   AST 10 09/12/2015 1132   AST 14* 05/23/2015 1725   ALT <9 09/12/2015 1132   ALT 11* 05/23/2015 1725   BILITOT 0.42 09/12/2015 1132   BILITOT 0.5 05/23/2015 1725       RADIOGRAPHIC STUDIES: No results found. ASSESSMENT AND PLAN: This is a pleasant 82 years old white male recently diagnosed with metastatic non-small cell lung cancer, adenocarcinoma with negative EGFR mutation and negative ALK gene translocation.  He is currently undergoing systemic chemotherapy with carboplatin and Alimta status post 5 cycles.  He tolerated this treatment well  except for the visual disturbance and increased lacrimation. The recent CT scan of the chest, abdomen and pelvis showed mixed response with increase in the size of the right upper lobe lung mass but decrease in the size of the malignant right hilar lymph node. The patient is currently undergoing second line treatment with immunotherapy with Nivolumab status post 3 cycles. He tolerated the second cycle of his treatment fairly well with no significant adverse effects. I recommended for him to proceed with cycle #4 today as a scheduled. The patient would come back for follow-up visit in 2 weeks for reevaluation before starting cycle #5After repeating CT scan of the chest, abdomen and pelvis for restaging of his disease. He was advised to call immediately if he has any concerning symptoms in the interval. The patient voices understanding of current disease status and treatment options and is in agreement with the current care plan.  All questions were answered. The  patient knows to call the clinic with any problems, questions or concerns. We can certainly see the patient much sooner if necessary.  Disclaimer: This note was dictated with voice recognition software. Similar sounding words can inadvertently be transcribed and may not be corrected upon review.

## 2015-09-27 ENCOUNTER — Telehealth: Payer: Self-pay | Admitting: *Deleted

## 2015-09-27 NOTE — Telephone Encounter (Signed)
"  I receiving Opdivo treatments.  Could you tell me how many I've received?" Informed him September 26, 2015 was the fourth treatment.  No further questions.

## 2015-10-09 ENCOUNTER — Ambulatory Visit (HOSPITAL_COMMUNITY)
Admission: RE | Admit: 2015-10-09 | Discharge: 2015-10-09 | Disposition: A | Discharge status: Home / Self Care | Payer: Medicare Other | Source: Ambulatory Visit | Attending: Internal Medicine | Admitting: Internal Medicine

## 2015-10-09 ENCOUNTER — Other Ambulatory Visit: Payer: Self-pay | Admitting: *Deleted

## 2015-10-09 ENCOUNTER — Encounter (HOSPITAL_COMMUNITY): Payer: Self-pay

## 2015-10-09 DIAGNOSIS — R918 Other nonspecific abnormal finding of lung field: Secondary | ICD-10-CM | POA: Diagnosis not present

## 2015-10-09 DIAGNOSIS — C3411 Malignant neoplasm of upper lobe, right bronchus or lung: Secondary | ICD-10-CM | POA: Insufficient documentation

## 2015-10-09 DIAGNOSIS — Z9225 Personal history of immunosupression therapy: Secondary | ICD-10-CM | POA: Insufficient documentation

## 2015-10-09 DIAGNOSIS — I714 Abdominal aortic aneurysm, without rupture: Secondary | ICD-10-CM | POA: Diagnosis not present

## 2015-10-09 DIAGNOSIS — Z5112 Encounter for antineoplastic immunotherapy: Secondary | ICD-10-CM

## 2015-10-09 DIAGNOSIS — J910 Malignant pleural effusion: Secondary | ICD-10-CM | POA: Insufficient documentation

## 2015-10-09 DIAGNOSIS — J90 Pleural effusion, not elsewhere classified: Secondary | ICD-10-CM

## 2015-10-09 DIAGNOSIS — C7972 Secondary malignant neoplasm of left adrenal gland: Secondary | ICD-10-CM | POA: Insufficient documentation

## 2015-10-09 MED ORDER — IOPAMIDOL (ISOVUE-300) INJECTION 61%
100.0000 mL | Freq: Once | INTRAVENOUS | Status: AC | PRN
  Administered 2015-10-09: 100 mL via INTRAVENOUS

## 2015-10-10 ENCOUNTER — Telehealth: Payer: Self-pay | Admitting: Internal Medicine

## 2015-10-10 ENCOUNTER — Other Ambulatory Visit (HOSPITAL_BASED_OUTPATIENT_CLINIC_OR_DEPARTMENT_OTHER): Payer: Medicare Other

## 2015-10-10 ENCOUNTER — Ambulatory Visit (HOSPITAL_BASED_OUTPATIENT_CLINIC_OR_DEPARTMENT_OTHER): Payer: Medicare Other

## 2015-10-10 ENCOUNTER — Ambulatory Visit (HOSPITAL_BASED_OUTPATIENT_CLINIC_OR_DEPARTMENT_OTHER): Payer: Medicare Other | Admitting: Internal Medicine

## 2015-10-10 ENCOUNTER — Encounter: Payer: Self-pay | Admitting: Internal Medicine

## 2015-10-10 VITALS — BP 118/72 | HR 79 | Temp 98.0°F | Resp 18 | Ht 68.0 in | Wt 181.6 lb

## 2015-10-10 DIAGNOSIS — Z5112 Encounter for antineoplastic immunotherapy: Secondary | ICD-10-CM

## 2015-10-10 DIAGNOSIS — C3411 Malignant neoplasm of upper lobe, right bronchus or lung: Secondary | ICD-10-CM

## 2015-10-10 DIAGNOSIS — C7951 Secondary malignant neoplasm of bone: Secondary | ICD-10-CM

## 2015-10-10 DIAGNOSIS — C7972 Secondary malignant neoplasm of left adrenal gland: Secondary | ICD-10-CM

## 2015-10-10 LAB — COMPREHENSIVE METABOLIC PANEL
ANION GAP: 10 meq/L (ref 3–11)
AST: 10 U/L (ref 5–34)
Albumin: 3.6 g/dL (ref 3.5–5.0)
Alkaline Phosphatase: 90 U/L (ref 40–150)
BILIRUBIN TOTAL: 0.42 mg/dL (ref 0.20–1.20)
BUN: 14.5 mg/dL (ref 7.0–26.0)
CALCIUM: 9.9 mg/dL (ref 8.4–10.4)
CO2: 26 mEq/L (ref 22–29)
CREATININE: 0.9 mg/dL (ref 0.7–1.3)
Chloride: 104 mEq/L (ref 98–109)
EGFR: 81 mL/min/{1.73_m2} — ABNORMAL LOW (ref >90)
Glucose: 147 mg/dl — ABNORMAL HIGH (ref 70–140)
Potassium: 4.3 mEq/L (ref 3.5–5.1)
Sodium: 140 mEq/L (ref 136–145)
TOTAL PROTEIN: 7.4 g/dL (ref 6.4–8.3)

## 2015-10-10 LAB — CBC WITH DIFFERENTIAL/PLATELET
BASO%: 0.2 % (ref 0.0–2.0)
Basophils Absolute: 0 10*3/uL (ref 0.0–0.1)
EOS ABS: 0 10*3/uL (ref 0.0–0.5)
EOS%: 0.2 % (ref 0.0–7.0)
HEMATOCRIT: 38.8 % (ref 38.4–49.9)
HGB: 12.5 g/dL — ABNORMAL LOW (ref 13.0–17.1)
LYMPH#: 1.9 10*3/uL (ref 0.9–3.3)
LYMPH%: 27.8 % (ref 14.0–49.0)
MCH: 29.5 pg (ref 27.2–33.4)
MCHC: 32.3 g/dL (ref 32.0–36.0)
MCV: 91.3 fL (ref 79.3–98.0)
MONO#: 0.6 10*3/uL (ref 0.1–0.9)
MONO%: 8.3 % (ref 0.0–14.0)
NEUT%: 63.5 % (ref 39.0–75.0)
NEUTROS ABS: 4.2 10*3/uL (ref 1.5–6.5)
PLATELETS: 223 10*3/uL (ref 140–400)
RBC: 4.25 10*6/uL (ref 4.20–5.82)
RDW: 13.5 % (ref 11.0–14.6)
WBC: 6.7 10*3/uL (ref 4.0–10.3)

## 2015-10-10 MED ORDER — SODIUM CHLORIDE 0.9 % IV SOLN
240.0000 mg | Freq: Once | INTRAVENOUS | Status: AC
  Administered 2015-10-10: 240 mg via INTRAVENOUS
  Filled 2015-10-10: qty 8

## 2015-10-10 MED ORDER — SODIUM CHLORIDE 0.9 % IV SOLN
Freq: Once | INTRAVENOUS | Status: AC
  Administered 2015-10-10: 12:00:00 via INTRAVENOUS

## 2015-10-10 NOTE — Telephone Encounter (Signed)
per pof to sch pt appt-pt sch already made ou

## 2015-10-10 NOTE — Progress Notes (Signed)
[place] Telephone:(336) [phone]   Fax:(336) [phone]  OFFICE PROGRESS NOTE  [name], MD [place] [address] [place] [address]  DIAGNOSIS: Stage IV (T2b, N2, M1b) non-small cell lung cancer, adenocarcinoma, with negative EGFR mutation, presented with right upper lobe lung mass in addition to pleural based masses, mediastinal lymphadenopathy as well as metastatic disease to the left adrenal, bone and abdominal lymph nodes diagnosed in August 2016.  PRIOR THERAPY:  1) Status post right Pleurx catheter placement under the care of [name] on 03/01/2015. 2) Systemic chemotherapy with carboplatin for AUC of 5 and Alimta 500 MG/M2. First cycle on 03/14/2015. Status post 5 cycles, discontinued secondary to disease progression.    CURRENT THERAPY: Second line treatment with immunotherapy with Nivolumab 240 MG IV every 2 weeks, first dose 08/15/2015. Status post 4 cycles.  INTERVAL HISTORY: Sean Cox 82 y.o. male returns to the clinic today for follow-up visit. He is feeling fine today with no specific complaints except for mild low back pain improved with Tylenol. He takes 4 tablets of Tylenol every day. He tolerated the last cycle of his treatment with immunotherapy fairly well with no significant adverse effects. He denied having any significant fever or chills. He has no nausea, vomiting, diarrhea but has occasional constipation and uses Senokot. He denied having any significant skin rash.. The patient denied having any significant chest pain, shortness of breath, cough or hemoptysis. He had repeat CT scan of the chest, abdomen and pelvis performed recently and he is here for evaluation and discussion of his scan results.  MEDICAL HISTORY: Past Medical History  Diagnosis Date  . Depression   . Hypothyroidism   . Hypercholesteremia   . Glaucoma   . Prostate cancer ([place])   . Ulcerative proctitis ([place])   . DDD (degenerative disc  disease), lumbosacral   . Anxiety   . IBS (irritable bowel syndrome)   . Detached retina   . Impaired fasting glucose   . Heart murmur     recently dx'ed with this 4 weeks ago  . Hypertension     no longer on medications  . GERD (gastroesophageal reflux disease)     in his younger years  . Malignant neoplasm of right upper lobe of lung ([place]) 02/14/2015    ALLERGIES:  is allergic to codeine; imuran; remicade; simvastatin; and sulfa antibiotics.  MEDICATIONS:  Current Outpatient Prescriptions  Medication Sig Dispense Refill  . acetaminophen (TYLENOL) 500 MG tablet Take 1,000 mg by mouth every 4 (four) hours as needed for mild pain, moderate pain, fever or headache.    . APRISO 0.375 G 24 hr capsule Take 8 capsules by mouth daily.   11  . atorvastatin (LIPITOR) 20 MG tablet Take 20 mg by mouth daily.   1  . dorzolamide (TRUSOPT) 2 % ophthalmic solution Place 1 drop into both eyes 3 (three) times daily.  3  . levothyroxine (SYNTHROID, LEVOTHROID) 25 MCG tablet Take 25 mcg by mouth daily.   1  . PARoxetine (PAXIL) 40 MG tablet TAKE 1 TABLET BY MOUTH DAILY  3  . prochlorperazine (COMPAZINE) 10 MG tablet Take 1 tablet (10 mg total) by mouth every 6 (six) hours as needed for nausea or vomiting. (Patient not taking: Reported on 09/26/2015) 30 tablet 0  . senna (SENOKOT) 8.6 MG tablet Take 1 tablet by mouth daily as needed for constipation. Reported on 09/26/2015    . timolol (TIMOPTIC) 0.5 % ophthalmic solution  Place 1 drop into both eyes 3 (three) times daily. Reported on 08/30/2015  3  . traMADol (ULTRAM) 50 MG tablet Take 1-2 tablets (50-100 mg total) by mouth every 6 (six) hours as needed (pain). (Patient not taking: Reported on 09/26/2015) 40 tablet 0   No current facility-administered medications for this visit.    SURGICAL HISTORY:  Past Surgical History  Procedure Laterality Date  . Colon surgery    . Prostatectomy      2004- Dr Risa Grill  . Tonsillectomy    . Diverticular stricture  removed      1992  . Fatty tumor excision      in chest  . Chest tube insertion Right 03/01/2015    Procedure: INSERTION PLEURAL DRAINAGE CATHETER RIGHT CHEST;  Surgeon: [name], MD;  Location: [facility];  Service: Thoracic;  Laterality: Right;  . Talc pleurodesis Right 04/26/2015    Procedure: Pietro Cassis;  Surgeon: [name], MD;  Location: [facility];  Service: Thoracic;  Laterality: Right;  . Removal of pleural drainage catheter Right 04/26/2015    Procedure: REMOVAL OF PLEURAL DRAINAGE CATHETER;  Surgeon: [name], MD;  Location: [facility];  Service: Thoracic;  Laterality: Right;    REVIEW OF SYSTEMS:  Constitutional: negative Eyes: negative Ears, nose, mouth, throat, and face: negative Respiratory: positive for dyspnea on exertion Cardiovascular: negative Gastrointestinal: negative Genitourinary:negative Integument/breast: negative Hematologic/lymphatic: negative Musculoskeletal:positive for back pain Neurological: negative Behavioral/Psych: negative Endocrine: negative Allergic/Immunologic: negative   PHYSICAL EXAMINATION: General appearance: alert, cooperative, fatigued and no distress Head: Normocephalic, without obvious abnormality, atraumatic Neck: no adenopathy, no JVD, supple, symmetrical, trachea midline and thyroid not enlarged, symmetric, no tenderness/mass/nodules Lymph nodes: Cervical, supraclavicular, and axillary nodes normal. Resp: clear to auscultation bilaterally Back: symmetric, no curvature. ROM normal. No CVA tenderness. Cardio: regular rate and rhythm, S1, S2 normal, no murmur, click, rub or gallop GI: soft, non-tender; bowel sounds normal; no masses,  no organomegaly Extremities: extremities normal, atraumatic, no cyanosis or edema Neurologic: Alert and oriented X 3, normal strength and tone. Normal symmetric reflexes. Normal coordination and gait  ECOG PERFORMANCE STATUS: 1 - Symptomatic but completely ambulatory  Blood  pressure 118/72, pulse 79, temperature 98 F (36.7 C), temperature source Oral, resp. rate 18, height _0  (1.727 m), weight 181 lb 9.6 oz (82.373 kg), SpO2 97 %.  LABORATORY DATA: Lab Results  Component Value Date   WBC 6.7 10/10/2015   HGB 12.5* 10/10/2015   HCT 38.8 10/10/2015   MCV 91.3 10/10/2015   PLT 223 10/10/2015      Chemistry      Component Value Date/Time   NA 142 09/26/2015 1146   NA 140 05/23/2015 1725   K 4.3 09/26/2015 1146   K 4.3 05/23/2015 1725   CL 100* 05/23/2015 1725   CO2 26 09/26/2015 1146   CO2 31 05/23/2015 1725   BUN 16.1 09/26/2015 1146   BUN 28* 05/23/2015 1725   CREATININE 0.9 09/26/2015 1146   CREATININE 0.88 05/23/2015 1725      Component Value Date/Time   CALCIUM 9.6 09/26/2015 1146   CALCIUM 9.6 05/23/2015 1725   ALKPHOS 91 09/26/2015 1146   ALKPHOS 70 05/23/2015 1725   AST 10 09/26/2015 1146   AST 14* 05/23/2015 1725   ALT <9 09/26/2015 1146   ALT 11* 05/23/2015 1725   BILITOT 0.40 09/26/2015 1146   BILITOT 0.5 05/23/2015 1725       RADIOGRAPHIC STUDIES: Ct Chest W Contrast  10/09/2015  CLINICAL DATA:  Right upper lobe lung carcinoma diagnosed August 2016 presenting for restaging on antineoplastic immunotherapy. EXAM: CT CHEST, ABDOMEN, AND PELVIS WITH CONTRAST TECHNIQUE: Multidetector CT imaging of the chest, abdomen and pelvis was performed following the standard protocol during bolus administration of intravenous contrast. CONTRAST:  171m ISOVUE-300 IOPAMIDOL (ISOVUE-300) INJECTION 61% COMPARISON:  07/18/2015 CT chest, abdomen and pelvis. FINDINGS: CT CHEST Mediastinum/Nodes: Normal heart size. No pericardial fluid/thickening. Mild aortic and mitral annular calcification. Great vessels are normal in course and caliber. No central pulmonary emboli. Normal visualized thyroid. Normal esophagus. No axillary adenopathy. No pathologically enlarged mediastinal or hilar nodes. Previously described enlarged 1.4 cm posterior right hilar node  now measures 0.5 cm, significantly decreased. Lungs/Pleura: No pneumothorax. Stable small right pleural effusion with linear pleural calcification in the basilar right pleural space, unchanged. No left pleural effusion. Medial right upper lobe irregular 5.5 x 4.3 cm lung mass (series 4/image 30), previously 5.1 x 3.8 cm, increased in size. New subpleural 3 mm basilar right upper lobe pulmonary nodule (series 4/ image 80). A few additional scattered subcentimeter pulmonary nodules in both lungs are stable, largest 7 mm in the left lower lobe (series 4/image 87 and series 4/ image 105). No acute consolidative airspace disease. Musculoskeletal: No aggressive appearing focal osseous lesions. Moderate degenerative changes in the thoracic spine. CT ABDOMEN AND PELVIS Hepatobiliary: Stable granulomatous calcification in the left liver dome. Otherwise normal liver with no liver masses. Normal gallbladder with no radiopaque cholelithiasis. No biliary ductal dilatation. Pancreas: Normal, with no mass or duct dilation. Spleen: Normal size. No mass. Adrenals/Urinary Tract: Normal right adrenal. Stable 1.9 x 1.8 cm left adrenal metastasis (series 2/ image 570. No hydronephrosis. Normal bladder. Stomach/Bowel: Grossly normal stomach. Normal caliber small bowel with no small bowel wall thickening. Normal appendix. Colonic anastomosis is again noted in the distal sigmoid region. No large bowel wall thickening or pericolonic fat stranding. Oral contrast progresses to the distal colon. Vascular/Lymphatic: Atherosclerotic abdominal aorta with stable 3.4 cm infrarenal abdominal aortic aneurysm. Patent portal, splenic and renal veins. No pathologically enlarged lymph nodes in the abdomen or pelvis. Reproductive: Status post prostatectomy. No fluid collection or mass in the prostatectomy bed. Other: No pneumoperitoneum, ascites or focal fluid collection. Musculoskeletal: No aggressive appearing focal osseous lesions. Moderate to severe  degenerative changes in the lumbar spine. IMPRESSION: 1. Primary right upper lobe malignancy has increased in size. 2. New tiny subpleural right upper lobe pulmonary nodule, indeterminate. Additional subcentimeter bilateral pulmonary nodules are stable. 3. Stable small malignant right pleural effusion. 4. Stable left adrenal metastasis. No additional sites of metastatic disease in the abdomen or pelvis. 5. Stable 3.4 cm infrarenal abdominal aortic aneurysm. Electronically Signed   By: JIlona SorrelM.D.   On: 10/09/2015 17:38   Ct Abdomen Pelvis W Contrast  10/09/2015  CLINICAL DATA:  Right upper lobe lung carcinoma diagnosed August 2016 presenting for restaging on antineoplastic immunotherapy. EXAM: CT CHEST, ABDOMEN, AND PELVIS WITH CONTRAST TECHNIQUE: Multidetector CT imaging of the chest, abdomen and pelvis was performed following the standard protocol during bolus administration of intravenous contrast. CONTRAST:  1033mISOVUE-300 IOPAMIDOL (ISOVUE-300) INJECTION 61% COMPARISON:  07/18/2015 CT chest, abdomen and pelvis. FINDINGS: CT CHEST Mediastinum/Nodes: Normal heart size. No pericardial fluid/thickening. Mild aortic and mitral annular calcification. Great vessels are normal in course and caliber. No central pulmonary emboli. Normal visualized thyroid. Normal esophagus. No axillary adenopathy. No pathologically enlarged mediastinal or hilar nodes. Previously described enlarged 1.4 cm posterior right hilar node now measures  0.5 cm, significantly decreased. Lungs/Pleura: No pneumothorax. Stable small right pleural effusion with linear pleural calcification in the basilar right pleural space, unchanged. No left pleural effusion. Medial right upper lobe irregular 5.5 x 4.3 cm lung mass (series 4/image 30), previously 5.1 x 3.8 cm, increased in size. New subpleural 3 mm basilar right upper lobe pulmonary nodule (series 4/ image 80). A few additional scattered subcentimeter pulmonary nodules in both lungs are  stable, largest 7 mm in the left lower lobe (series 4/image 87 and series 4/ image 105). No acute consolidative airspace disease. Musculoskeletal: No aggressive appearing focal osseous lesions. Moderate degenerative changes in the thoracic spine. CT ABDOMEN AND PELVIS Hepatobiliary: Stable granulomatous calcification in the left liver dome. Otherwise normal liver with no liver masses. Normal gallbladder with no radiopaque cholelithiasis. No biliary ductal dilatation. Pancreas: Normal, with no mass or duct dilation. Spleen: Normal size. No mass. Adrenals/Urinary Tract: Normal right adrenal. Stable 1.9 x 1.8 cm left adrenal metastasis (series 2/ image 29). No hydronephrosis. Normal bladder. Stomach/Bowel: Grossly normal stomach. Normal caliber small bowel with no small bowel wall thickening. Normal appendix. Colonic anastomosis is again noted in the distal sigmoid region. No large bowel wall thickening or pericolonic fat stranding. Oral contrast progresses to the distal colon. Vascular/Lymphatic: Atherosclerotic abdominal aorta with stable 3.4 cm infrarenal abdominal aortic aneurysm. Patent portal, splenic and renal veins. No pathologically enlarged lymph nodes in the abdomen or pelvis. Reproductive: Status post prostatectomy. No fluid collection or mass in the prostatectomy bed. Other: No pneumoperitoneum, ascites or focal fluid collection. Musculoskeletal: No aggressive appearing focal osseous lesions. Moderate to severe degenerative changes in the lumbar spine. IMPRESSION: 1. Primary right upper lobe malignancy has increased in size. 2. New tiny subpleural right upper lobe pulmonary nodule, indeterminate. Additional subcentimeter bilateral pulmonary nodules are stable. 3. Stable small malignant right pleural effusion. 4. Stable left adrenal metastasis. No additional sites of metastatic disease in the abdomen or pelvis. 5. Stable 3.4 cm infrarenal abdominal aortic aneurysm. Electronically Signed   By: Ilona Sorrel  M.D.   On: 10/09/2015 17:38   ASSESSMENT AND PLAN: This is a pleasant 82 years old white male recently diagnosed with metastatic non-small cell lung cancer, adenocarcinoma with negative EGFR mutation and negative ALK gene translocation.  He is currently undergoing systemic chemotherapy with carboplatin and Alimta status post 5 cycles.  He tolerated this treatment well except for the visual disturbance and increased lacrimation. The recent CT scan of the chest, abdomen and pelvis showed mixed response with increase in the size of the right upper lobe lung mass but decrease in the size of the malignant right hilar lymph node. The patient is currently undergoing second line treatment with immunotherapy with Nivolumab status post 4 cycles. He tolerated the last cycle of his treatment fairly well with no significant adverse effects. The recent CT scan of the chest, abdomen and pelvis showed no significant evidence for disease progression except for slight increase in the size of the right upper lobe lung mass. I discussed the scan results with the patient today. I recommended for him to continue his current treatment with Nivolumab as a scheduled and we will continue to monitor the right upper lobe lung mass closely on upcoming scan. He will proceed with cycle #5 today. The patient would come back for follow-up visit in 2 weeks for reevaluation before starting cycle #6.  He was advised to call immediately if he has any concerning symptoms in the interval. The patient voices understanding of  current disease status and treatment options and is in agreement with the current care plan.  All questions were answered. The patient knows to call the clinic with any problems, questions or concerns. We can certainly see the patient much sooner if necessary.  Disclaimer: This note was dictated with voice recognition software. Similar sounding words can inadvertently be transcribed and may not be corrected upon  review.

## 2015-10-10 NOTE — Patient Instructions (Signed)
[facility] Discharge Instructions for Patients Receiving Chemotherapy  Today you received the following chemotherapy agents:  Nivolumab.  To help prevent nausea and vomiting after your treatment, we encourage you to take your nausea medication as directed.   If you develop nausea and vomiting that is not controlled by your nausea medication, call the clinic.   BELOW ARE SYMPTOMS THAT SHOULD BE REPORTED IMMEDIATELY:  *FEVER GREATER THAN 100.5 F  *CHILLS WITH OR WITHOUT FEVER  NAUSEA AND VOMITING THAT IS NOT CONTROLLED WITH YOUR NAUSEA MEDICATION  *UNUSUAL SHORTNESS OF BREATH  *UNUSUAL BRUISING OR BLEEDING  TENDERNESS IN MOUTH AND THROAT WITH OR WITHOUT PRESENCE OF ULCERS  *URINARY PROBLEMS  *BOWEL PROBLEMS  UNUSUAL RASH Items with * indicate a potential emergency and should be followed up as soon as possible.  Feel free to call the clinic you have any questions or concerns. The clinic phone number is [phone].  Please show the CHEMO ALERT CARD at check-in to the Emergency Department and triage nurse.   

## 2015-10-23 ENCOUNTER — Other Ambulatory Visit: Payer: Self-pay

## 2015-10-24 ENCOUNTER — Other Ambulatory Visit: Payer: Self-pay | Admitting: *Deleted

## 2015-10-24 ENCOUNTER — Other Ambulatory Visit (HOSPITAL_BASED_OUTPATIENT_CLINIC_OR_DEPARTMENT_OTHER): Payer: Medicare Other

## 2015-10-24 ENCOUNTER — Ambulatory Visit (HOSPITAL_BASED_OUTPATIENT_CLINIC_OR_DEPARTMENT_OTHER): Payer: Medicare Other | Admitting: Internal Medicine

## 2015-10-24 ENCOUNTER — Encounter: Payer: Self-pay | Admitting: Internal Medicine

## 2015-10-24 ENCOUNTER — Ambulatory Visit (HOSPITAL_BASED_OUTPATIENT_CLINIC_OR_DEPARTMENT_OTHER): Payer: Medicare Other

## 2015-10-24 ENCOUNTER — Telehealth: Payer: Self-pay | Admitting: *Deleted

## 2015-10-24 VITALS — BP 113/77 | HR 75 | Temp 97.9°F | Resp 17 | Ht 68.0 in | Wt 180.5 lb

## 2015-10-24 DIAGNOSIS — Z5112 Encounter for antineoplastic immunotherapy: Secondary | ICD-10-CM | POA: Diagnosis not present

## 2015-10-24 DIAGNOSIS — C7972 Secondary malignant neoplasm of left adrenal gland: Secondary | ICD-10-CM

## 2015-10-24 DIAGNOSIS — C7951 Secondary malignant neoplasm of bone: Secondary | ICD-10-CM | POA: Diagnosis not present

## 2015-10-24 DIAGNOSIS — C3411 Malignant neoplasm of upper lobe, right bronchus or lung: Secondary | ICD-10-CM

## 2015-10-24 DIAGNOSIS — C772 Secondary and unspecified malignant neoplasm of intra-abdominal lymph nodes: Secondary | ICD-10-CM | POA: Diagnosis not present

## 2015-10-24 LAB — CBC WITH DIFFERENTIAL/PLATELET
BASO%: 0.2 % (ref 0.0–2.0)
Basophils Absolute: 0 10*3/uL (ref 0.0–0.1)
EOS%: 0.2 % (ref 0.0–7.0)
Eosinophils Absolute: 0 10*3/uL (ref 0.0–0.5)
HCT: 39.1 % (ref 38.4–49.9)
HGB: 12.4 g/dL — ABNORMAL LOW (ref 13.0–17.1)
LYMPH%: 29.6 % (ref 14.0–49.0)
MCH: 28.8 pg (ref 27.2–33.4)
MCHC: 31.8 g/dL — ABNORMAL LOW (ref 32.0–36.0)
MCV: 90.6 fL (ref 79.3–98.0)
MONO#: 0.5 10*3/uL (ref 0.1–0.9)
MONO%: 7 % (ref 0.0–14.0)
NEUT%: 63 % (ref 39.0–75.0)
NEUTROS ABS: 4.5 10*3/uL (ref 1.5–6.5)
PLATELETS: 232 10*3/uL (ref 140–400)
RBC: 4.32 10*6/uL (ref 4.20–5.82)
RDW: 13.6 % (ref 11.0–14.6)
WBC: 7.1 10*3/uL (ref 4.0–10.3)
lymph#: 2.1 10*3/uL (ref 0.9–3.3)

## 2015-10-24 LAB — COMPREHENSIVE METABOLIC PANEL
ALBUMIN: 3.4 g/dL — AB (ref 3.5–5.0)
ALK PHOS: 93 U/L (ref 40–150)
ALT: <9 U/L (ref 0–55)
ANION GAP: 9 meq/L (ref 3–11)
AST: 10 U/L (ref 5–34)
BILIRUBIN TOTAL: 0.31 mg/dL (ref 0.20–1.20)
BUN: 14.3 mg/dL (ref 7.0–26.0)
CO2: 26 meq/L (ref 22–29)
Calcium: 9.8 mg/dL (ref 8.4–10.4)
Chloride: 105 mEq/L (ref 98–109)
Creatinine: 0.9 mg/dL (ref 0.7–1.3)
EGFR: 81 mL/min/{1.73_m2} — AB (ref >90)
Glucose: 156 mg/dl — ABNORMAL HIGH (ref 70–140)
Potassium: 4.3 mEq/L (ref 3.5–5.1)
Sodium: 140 mEq/L (ref 136–145)
TOTAL PROTEIN: 7.4 g/dL (ref 6.4–8.3)

## 2015-10-24 MED ORDER — SODIUM CHLORIDE 0.9 % IV SOLN
240.0000 mg | Freq: Once | INTRAVENOUS | Status: AC
  Administered 2015-10-24: 240 mg via INTRAVENOUS
  Filled 2015-10-24: qty 20

## 2015-10-24 MED ORDER — SODIUM CHLORIDE 0.9 % IV SOLN
Freq: Once | INTRAVENOUS | Status: AC
  Administered 2015-10-24: 13:00:00 via INTRAVENOUS

## 2015-10-24 NOTE — Progress Notes (Signed)
[place] [phone]   [phone]  OFFICE PROGRESS NOTE  [name], MD [place] [address] [place] [address]  DIAGNOSIS: Stage IV (T2b, N2, M1b) non-small cell lung cancer, adenocarcinoma, with negative EGFR mutation, presented with right upper lobe lung mass in addition to pleural based masses, mediastinal lymphadenopathy as well as metastatic disease to the left adrenal, bone and abdominal lymph nodes diagnosed in August 2016.  PRIOR THERAPY:  1) Status post right Pleurx catheter placement under the care of [name] on 03/01/2015. 2) Systemic chemotherapy with carboplatin for AUC of 5 and Alimta 500 MG/M2. First cycle on 03/14/2015. Status post 5 cycles, discontinued secondary to disease progression.    CURRENT THERAPY: Second line treatment with immunotherapy with Nivolumab 240 MG IV every 2 weeks, first dose 08/15/2015. Status post 5 cycles.  INTERVAL HISTORY: Sean Cox 82 y.o. male returns to the clinic today for follow-up visit. He is feeling fine today with no specific complaints except for mild low back pain improved with Tylenol. He also has fatigue and weakness of the lower extremities. He still exercises daily basis and walks his dog daily. He tolerated the last cycle of his treatment with immunotherapy fairly well with no significant adverse effects. He denied having any significant fever or chills. He has no nausea, vomiting, diarrhea but has occasional constipation and uses Senokot. He denied having any significant skin rash.. The patient denied having any significant chest pain, shortness of breath, cough or hemoptysis. He is here today to start cycle #6 of his immunotherapy.  MEDICAL HISTORY: Past Medical History  Diagnosis Date  . Depression   . Hypothyroidism   . Hypercholesteremia   . Glaucoma   . Prostate cancer ([place])   . Ulcerative proctitis ([place])   . DDD (degenerative disc disease),  lumbosacral   . Anxiety   . IBS (irritable bowel syndrome)   . Detached retina   . Impaired fasting glucose   . Heart murmur     recently dx'ed with this 4 weeks ago  . Hypertension     no longer on medications  . GERD (gastroesophageal reflux disease)     in his younger years  . Malignant neoplasm of right upper lobe of lung ([place]) 02/14/2015    ALLERGIES:  is allergic to codeine; imuran; remicade; simvastatin; and sulfa antibiotics.  MEDICATIONS:  Current Outpatient Prescriptions  Medication Sig Dispense Refill  . acetaminophen (TYLENOL) 500 MG tablet Take 1,000 mg by mouth every 4 (four) hours as needed for mild pain, moderate pain, fever or headache.    . APRISO 0.375 G 24 hr capsule Take 8 capsules by mouth daily.   11  . atorvastatin (LIPITOR) 20 MG tablet Take 20 mg by mouth daily.   1  . dorzolamide (TRUSOPT) 2 % ophthalmic solution Place 1 drop into both eyes 3 (three) times daily.  3  . levothyroxine (SYNTHROID, LEVOTHROID) 25 MCG tablet Take 25 mcg by mouth daily.   1  . PARoxetine (PAXIL) 40 MG tablet TAKE 1 TABLET BY MOUTH DAILY  3  . prochlorperazine (COMPAZINE) 10 MG tablet Take 1 tablet (10 mg total) by mouth every 6 (six) hours as needed for nausea or vomiting. 30 tablet 0  . senna (SENOKOT) 8.6 MG tablet Take 1 tablet by mouth daily as needed for constipation. Reported on 09/26/2015    . timolol (TIMOPTIC) 0.5 % ophthalmic solution Place 1 drop into both eyes 3 (three)  times daily. Reported on 08/30/2015  3  . traMADol (ULTRAM) 50 MG tablet Take 1-2 tablets (50-100 mg total) by mouth every 6 (six) hours as needed (pain). 40 tablet 0   No current facility-administered medications for this visit.    SURGICAL HISTORY:  Past Surgical History  Procedure Laterality Date  . Colon surgery    . Prostatectomy      2004- Dr Risa Grill  . Tonsillectomy    . Diverticular stricture removed      1992  . Fatty tumor excision      in chest  . Chest tube insertion Right 03/01/2015      Procedure: INSERTION PLEURAL DRAINAGE CATHETER RIGHT CHEST;  Surgeon: [name], MD;  Location: [facility];  Service: Thoracic;  Laterality: Right;  . [place] pleurodesis Right 04/26/2015    Procedure: Pietro Cassis;  Surgeon: [name], MD;  Location: [facility];  Service: Thoracic;  Laterality: Right;  . Removal of pleural drainage catheter Right 04/26/2015    Procedure: REMOVAL OF PLEURAL DRAINAGE CATHETER;  Surgeon: [name], MD;  Location: [facility];  Service: Thoracic;  Laterality: Right;    REVIEW OF SYSTEMS:  A comprehensive review of systems was negative except for: Constitutional: positive for fatigue Musculoskeletal: positive for muscle weakness   PHYSICAL EXAMINATION: General appearance: alert, cooperative, fatigued and no distress Head: Normocephalic, without obvious abnormality, atraumatic Neck: no adenopathy, no JVD, supple, symmetrical, trachea midline and thyroid not enlarged, symmetric, no tenderness/mass/nodules Lymph nodes: Cervical, supraclavicular, and axillary nodes normal. Resp: clear to auscultation bilaterally Back: symmetric, no curvature. ROM normal. No CVA tenderness. Cardio: regular rate and rhythm, S1, S2 normal, no murmur, click, rub or gallop GI: soft, non-tender; bowel sounds normal; no masses,  no organomegaly Extremities: extremities normal, atraumatic, no cyanosis or edema Neurologic: Alert and oriented X 3, normal strength and tone. Normal symmetric reflexes. Normal coordination and gait  ECOG PERFORMANCE STATUS: 1 - Symptomatic but completely ambulatory  Blood pressure 113/77, pulse 75, temperature 97.9 F (36.6 C), temperature source Oral, resp. rate 17, height '5\' 8"'$  (1.727 m), weight 180 lb 8 oz (81.874 kg), SpO2 95 %.  LABORATORY DATA: Lab Results  Component Value Date   WBC 7.1 10/24/2015   HGB 12.4* 10/24/2015   HCT 39.1 10/24/2015   MCV 90.6 10/24/2015   PLT 232 10/24/2015      Chemistry      Component Value  Date/Time   NA 140 10/10/2015 1045   NA 140 05/23/2015 1725   K 4.3 10/10/2015 1045   K 4.3 05/23/2015 1725   CL 100* 05/23/2015 1725   CO2 26 10/10/2015 1045   CO2 31 05/23/2015 1725   BUN 14.5 10/10/2015 1045   BUN 28* 05/23/2015 1725   CREATININE 0.9 10/10/2015 1045   CREATININE 0.88 05/23/2015 1725      Component Value Date/Time   CALCIUM 9.9 10/10/2015 1045   CALCIUM 9.6 05/23/2015 1725   ALKPHOS 90 10/10/2015 1045   ALKPHOS 70 05/23/2015 1725   AST 10 10/10/2015 1045   AST 14* 05/23/2015 1725   ALT <9 10/10/2015 1045   ALT 11* 05/23/2015 1725   BILITOT 0.42 10/10/2015 1045   BILITOT 0.5 05/23/2015 1725       RADIOGRAPHIC STUDIES: Ct Chest W Contrast  10/09/2015  CLINICAL DATA:  Right upper lobe lung carcinoma diagnosed August 2016 presenting for restaging on antineoplastic immunotherapy. EXAM: CT CHEST, ABDOMEN, AND PELVIS WITH CONTRAST TECHNIQUE: Multidetector CT imaging of the chest, abdomen  and pelvis was performed following the standard protocol during bolus administration of intravenous contrast. CONTRAST:  166m ISOVUE-300 IOPAMIDOL (ISOVUE-300) INJECTION 61% COMPARISON:  07/18/2015 CT chest, abdomen and pelvis. FINDINGS: CT CHEST Mediastinum/Nodes: Normal heart size. No pericardial fluid/thickening. Mild aortic and mitral annular calcification. Great vessels are normal in course and caliber. No central pulmonary emboli. Normal visualized thyroid. Normal esophagus. No axillary adenopathy. No pathologically enlarged mediastinal or hilar nodes. Previously described enlarged 1.4 cm posterior right hilar node now measures 0.5 cm, significantly decreased. Lungs/Pleura: No pneumothorax. Stable small right pleural effusion with linear pleural calcification in the basilar right pleural space, unchanged. No left pleural effusion. Medial right upper lobe irregular 5.5 x 4.3 cm lung mass (series 4/image 30), previously 5.1 x 3.8 cm, increased in size. New subpleural 3 mm basilar right  upper lobe pulmonary nodule (series 4/ image 80). A few additional scattered subcentimeter pulmonary nodules in both lungs are stable, largest 7 mm in the left lower lobe (series 4/image 87 and series 4/ image 105). No acute consolidative airspace disease. Musculoskeletal: No aggressive appearing focal osseous lesions. Moderate degenerative changes in the thoracic spine. CT ABDOMEN AND PELVIS Hepatobiliary: Stable granulomatous calcification in the left liver dome. Otherwise normal liver with no liver masses. Normal gallbladder with no radiopaque cholelithiasis. No biliary ductal dilatation. Pancreas: Normal, with no mass or duct dilation. Spleen: Normal size. No mass. Adrenals/Urinary Tract: Normal right adrenal. Stable 1.9 x 1.8 cm left adrenal metastasis (series 2/ image 522. No hydronephrosis. Normal bladder. Stomach/Bowel: Grossly normal stomach. Normal caliber small bowel with no small bowel wall thickening. Normal appendix. Colonic anastomosis is again noted in the distal sigmoid region. No large bowel wall thickening or pericolonic fat stranding. Oral contrast progresses to the distal colon. Vascular/Lymphatic: Atherosclerotic abdominal aorta with stable 3.4 cm infrarenal abdominal aortic aneurysm. Patent portal, splenic and renal veins. No pathologically enlarged lymph nodes in the abdomen or pelvis. Reproductive: Status post prostatectomy. No fluid collection or mass in the prostatectomy bed. Other: No pneumoperitoneum, ascites or focal fluid collection. Musculoskeletal: No aggressive appearing focal osseous lesions. Moderate to severe degenerative changes in the lumbar spine. IMPRESSION: 1. Primary right upper lobe malignancy has increased in size. 2. New tiny subpleural right upper lobe pulmonary nodule, indeterminate. Additional subcentimeter bilateral pulmonary nodules are stable. 3. Stable small malignant right pleural effusion. 4. Stable left adrenal metastasis. No additional sites of metastatic  disease in the abdomen or pelvis. 5. Stable 3.4 cm infrarenal abdominal aortic aneurysm. Electronically Signed   By: JIlona SorrelM.D.   On: 10/09/2015 17:38   Ct Abdomen Pelvis W Contrast  10/09/2015  CLINICAL DATA:  Right upper lobe lung carcinoma diagnosed August 2016 presenting for restaging on antineoplastic immunotherapy. EXAM: CT CHEST, ABDOMEN, AND PELVIS WITH CONTRAST TECHNIQUE: Multidetector CT imaging of the chest, abdomen and pelvis was performed following the standard protocol during bolus administration of intravenous contrast. CONTRAST:  1077mISOVUE-300 IOPAMIDOL (ISOVUE-300) INJECTION 61% COMPARISON:  07/18/2015 CT chest, abdomen and pelvis. FINDINGS: CT CHEST Mediastinum/Nodes: Normal heart size. No pericardial fluid/thickening. Mild aortic and mitral annular calcification. Great vessels are normal in course and caliber. No central pulmonary emboli. Normal visualized thyroid. Normal esophagus. No axillary adenopathy. No pathologically enlarged mediastinal or hilar nodes. Previously described enlarged 1.4 cm posterior right hilar node now measures 0.5 cm, significantly decreased. Lungs/Pleura: No pneumothorax. Stable small right pleural effusion with linear pleural calcification in the basilar right pleural space, unchanged. No left pleural effusion. Medial right upper lobe irregular 5.5  x 4.3 cm lung mass (series 4/image 30), previously 5.1 x 3.8 cm, increased in size. New subpleural 3 mm basilar right upper lobe pulmonary nodule (series 4/ image 80). A few additional scattered subcentimeter pulmonary nodules in both lungs are stable, largest 7 mm in the left lower lobe (series 4/image 87 and series 4/ image 105). No acute consolidative airspace disease. Musculoskeletal: No aggressive appearing focal osseous lesions. Moderate degenerative changes in the thoracic spine. CT ABDOMEN AND PELVIS Hepatobiliary: Stable granulomatous calcification in the left liver dome. Otherwise normal liver with no  liver masses. Normal gallbladder with no radiopaque cholelithiasis. No biliary ductal dilatation. Pancreas: Normal, with no mass or duct dilation. Spleen: Normal size. No mass. Adrenals/Urinary Tract: Normal right adrenal. Stable 1.9 x 1.8 cm left adrenal metastasis (series 2/ image 94). No hydronephrosis. Normal bladder. Stomach/Bowel: Grossly normal stomach. Normal caliber small bowel with no small bowel wall thickening. Normal appendix. Colonic anastomosis is again noted in the distal sigmoid region. No large bowel wall thickening or pericolonic fat stranding. Oral contrast progresses to the distal colon. Vascular/Lymphatic: Atherosclerotic abdominal aorta with stable 3.4 cm infrarenal abdominal aortic aneurysm. Patent portal, splenic and renal veins. No pathologically enlarged lymph nodes in the abdomen or pelvis. Reproductive: Status post prostatectomy. No fluid collection or mass in the prostatectomy bed. Other: No pneumoperitoneum, ascites or focal fluid collection. Musculoskeletal: No aggressive appearing focal osseous lesions. Moderate to severe degenerative changes in the lumbar spine. IMPRESSION: 1. Primary right upper lobe malignancy has increased in size. 2. New tiny subpleural right upper lobe pulmonary nodule, indeterminate. Additional subcentimeter bilateral pulmonary nodules are stable. 3. Stable small malignant right pleural effusion. 4. Stable left adrenal metastasis. No additional sites of metastatic disease in the abdomen or pelvis. 5. Stable 3.4 cm infrarenal abdominal aortic aneurysm. Electronically Signed   By: Ilona Sorrel M.D.   On: 10/09/2015 17:38   ASSESSMENT AND PLAN: This is a pleasant 82 years old white male recently diagnosed with metastatic non-small cell lung cancer, adenocarcinoma with negative EGFR mutation and negative ALK gene translocation.  He is currently undergoing systemic chemotherapy with carboplatin and Alimta status post 5 cycles.  He tolerated this treatment well  except for the visual disturbance and increased lacrimation. The recent CT scan of the chest, abdomen and pelvis showed mixed response with increase in the size of the right upper lobe lung mass but decrease in the size of the malignant right hilar lymph node. The patient is currently undergoing second line treatment with immunotherapy with Nivolumab status post 5 cycles. He tolerated the last cycle of his treatment fairly well with no significant adverse effects. I recommended for him to continue his current treatment with Nivolumab as a scheduled and he will proceed with cycle #6 today. The patient would come back for follow-up visit in 2 weeks for reevaluation before starting cycle #7.  He was advised to call immediately if he has any concerning symptoms in the interval. The patient voices understanding of current disease status and treatment options and is in agreement with the current care plan.  All questions were answered. The patient knows to call the clinic with any problems, questions or concerns. We can certainly see the patient much sooner if necessary.  Disclaimer: This note was dictated with voice recognition software. Similar sounding words can inadvertently be transcribed and may not be corrected upon review.

## 2015-10-24 NOTE — Patient Instructions (Addendum)
[facility] Discharge Instructions for Patients Receiving Chemotherapy  Today you received the following chemotherapy agents NIVOLUMAB.  To help prevent nausea and vomiting after your treatment, we encourage you to take your nausea medication as prescribed.  If you develop nausea and vomiting that is not controlled by your nausea medication, call the clinic.   BELOW ARE SYMPTOMS THAT SHOULD BE REPORTED IMMEDIATELY:  *FEVER GREATER THAN 100.5 F  *CHILLS WITH OR WITHOUT FEVER  NAUSEA AND VOMITING THAT IS NOT CONTROLLED WITH YOUR NAUSEA MEDICATION  *UNUSUAL SHORTNESS OF BREATH  *UNUSUAL BRUISING OR BLEEDING  TENDERNESS IN MOUTH AND THROAT WITH OR WITHOUT PRESENCE OF ULCERS  *URINARY PROBLEMS  *BOWEL PROBLEMS  UNUSUAL RASH Items with * indicate a potential emergency and should be followed up as soon as possible.  Feel free to call the clinic you have any questions or concerns. The clinic phone number is [phone].  Please show the [place] at check-in to the Emergency Department and triage nurse.

## 2015-10-24 NOTE — Telephone Encounter (Signed)
Per POF I have scheduled appts. Patient given calendar.  JMW

## 2015-11-03 ENCOUNTER — Other Ambulatory Visit: Payer: Self-pay | Admitting: Medical Oncology

## 2015-11-03 DIAGNOSIS — Z5111 Encounter for antineoplastic chemotherapy: Secondary | ICD-10-CM

## 2015-11-03 DIAGNOSIS — C3411 Malignant neoplasm of upper lobe, right bronchus or lung: Secondary | ICD-10-CM

## 2015-11-07 ENCOUNTER — Encounter: Payer: Self-pay | Admitting: Internal Medicine

## 2015-11-07 ENCOUNTER — Ambulatory Visit (HOSPITAL_BASED_OUTPATIENT_CLINIC_OR_DEPARTMENT_OTHER): Payer: Medicare Other | Admitting: Internal Medicine

## 2015-11-07 ENCOUNTER — Encounter: Payer: Self-pay | Admitting: *Deleted

## 2015-11-07 ENCOUNTER — Telehealth: Payer: Self-pay | Admitting: Internal Medicine

## 2015-11-07 ENCOUNTER — Other Ambulatory Visit (HOSPITAL_BASED_OUTPATIENT_CLINIC_OR_DEPARTMENT_OTHER): Payer: Medicare Other

## 2015-11-07 ENCOUNTER — Ambulatory Visit (HOSPITAL_BASED_OUTPATIENT_CLINIC_OR_DEPARTMENT_OTHER): Payer: Medicare Other

## 2015-11-07 VITALS — BP 120/80 | HR 66 | Temp 98.0°F | Resp 18 | Ht 68.0 in | Wt 179.2 lb

## 2015-11-07 DIAGNOSIS — C3411 Malignant neoplasm of upper lobe, right bronchus or lung: Secondary | ICD-10-CM

## 2015-11-07 DIAGNOSIS — M5442 Lumbago with sciatica, left side: Secondary | ICD-10-CM | POA: Diagnosis not present

## 2015-11-07 DIAGNOSIS — Z5112 Encounter for antineoplastic immunotherapy: Secondary | ICD-10-CM

## 2015-11-07 DIAGNOSIS — M545 Low back pain, unspecified: Secondary | ICD-10-CM

## 2015-11-07 DIAGNOSIS — Z79899 Other long term (current) drug therapy: Secondary | ICD-10-CM

## 2015-11-07 DIAGNOSIS — C7951 Secondary malignant neoplasm of bone: Secondary | ICD-10-CM | POA: Insufficient documentation

## 2015-11-07 DIAGNOSIS — Z5111 Encounter for antineoplastic chemotherapy: Secondary | ICD-10-CM

## 2015-11-07 HISTORY — DX: Low back pain, unspecified: M54.50

## 2015-11-07 LAB — COMPREHENSIVE METABOLIC PANEL
ALT: <9 U/L (ref 0–55)
AST: 8 U/L (ref 5–34)
Albumin: 3.4 g/dL — ABNORMAL LOW (ref 3.5–5.0)
Alkaline Phosphatase: 89 U/L (ref 40–150)
Anion Gap: 8 mEq/L (ref 3–11)
BUN: 14.1 mg/dL (ref 7.0–26.0)
CO2: 24 meq/L (ref 22–29)
Calcium: 9.6 mg/dL (ref 8.4–10.4)
Chloride: 106 mEq/L (ref 98–109)
Creatinine: 0.8 mg/dL (ref 0.7–1.3)
EGFR: 83 mL/min/{1.73_m2} — AB (ref >90)
GLUCOSE: 140 mg/dL (ref 70–140)
POTASSIUM: 4.4 meq/L (ref 3.5–5.1)
SODIUM: 139 meq/L (ref 136–145)
Total Bilirubin: 0.37 mg/dL (ref 0.20–1.20)
Total Protein: 7.4 g/dL (ref 6.4–8.3)

## 2015-11-07 LAB — CBC WITH DIFFERENTIAL/PLATELET
BASO%: 0.2 % (ref 0.0–2.0)
Basophils Absolute: 0 10*3/uL (ref 0.0–0.1)
EOS ABS: 0 10*3/uL (ref 0.0–0.5)
EOS%: 0.2 % (ref 0.0–7.0)
HEMATOCRIT: 37.5 % — AB (ref 38.4–49.9)
HEMOGLOBIN: 12 g/dL — AB (ref 13.0–17.1)
LYMPH#: 1.9 10*3/uL (ref 0.9–3.3)
LYMPH%: 30.3 % (ref 14.0–49.0)
MCH: 28.8 pg (ref 27.2–33.4)
MCHC: 32 g/dL (ref 32.0–36.0)
MCV: 89.9 fL (ref 79.3–98.0)
MONO#: 0.5 10*3/uL (ref 0.1–0.9)
MONO%: 7.5 % (ref 0.0–14.0)
NEUT%: 61.8 % (ref 39.0–75.0)
NEUTROS ABS: 4 10*3/uL (ref 1.5–6.5)
PLATELETS: 208 10*3/uL (ref 140–400)
RBC: 4.17 10*6/uL — AB (ref 4.20–5.82)
RDW: 13.4 % (ref 11.0–14.6)
WBC: 6.4 10*3/uL (ref 4.0–10.3)

## 2015-11-07 LAB — TSH: TSH: 0.988 m(IU)/L (ref 0.320–4.118)

## 2015-11-07 MED ORDER — SODIUM CHLORIDE 0.9 % IV SOLN
240.0000 mg | Freq: Once | INTRAVENOUS | Status: AC
  Administered 2015-11-07: 240 mg via INTRAVENOUS
  Filled 2015-11-07: qty 20

## 2015-11-07 MED ORDER — SODIUM CHLORIDE 0.9 % IV SOLN
Freq: Once | INTRAVENOUS | Status: AC
  Administered 2015-11-07: 15:00:00 via INTRAVENOUS

## 2015-11-07 NOTE — Telephone Encounter (Signed)
Gave adn printed appt sched adn avs for pt for June

## 2015-11-07 NOTE — Patient Instructions (Signed)
[facility] Discharge Instructions for Patients Receiving Chemotherapy  Today you received the following chemotherapy agents Nivolumab.  To help prevent nausea and vomiting after your treatment, we encourage you to take your nausea medication as prescribed.   If you develop nausea and vomiting that is not controlled by your nausea medication, call the clinic.   BELOW ARE SYMPTOMS THAT SHOULD BE REPORTED IMMEDIATELY:  *FEVER GREATER THAN 100.5 F  *CHILLS WITH OR WITHOUT FEVER  NAUSEA AND VOMITING THAT IS NOT CONTROLLED WITH YOUR NAUSEA MEDICATION  *UNUSUAL SHORTNESS OF BREATH  *UNUSUAL BRUISING OR BLEEDING  TENDERNESS IN MOUTH AND THROAT WITH OR WITHOUT PRESENCE OF ULCERS  *URINARY PROBLEMS  *BOWEL PROBLEMS  UNUSUAL RASH Items with * indicate a potential emergency and should be followed up as soon as possible.  Feel free to call the clinic you have any questions or concerns. The clinic phone number is [phone].  Please show the CHEMO ALERT CARD at check-in to the Emergency Department and triage nurse.   

## 2015-11-07 NOTE — Progress Notes (Signed)
[facility] [phone]   [phone]  OFFICE PROGRESS NOTE  [name], MD [place] [address] [place] [address]  DIAGNOSIS: Stage IV (T2b, N2, M1b) non-small cell lung cancer, adenocarcinoma, with negative EGFR mutation, presented with right upper lobe lung mass in addition to pleural based masses, mediastinal lymphadenopathy as well as metastatic disease to the left adrenal, bone and abdominal lymph nodes diagnosed in August 2016.  PRIOR THERAPY:  1) Status post right Pleurx catheter placement under the care of [name] on 03/01/2015. 2) Systemic chemotherapy with carboplatin for AUC of 5 and Alimta 500 MG/M2. First cycle on 03/14/2015. Status post 5 cycles, discontinued secondary to disease progression.    CURRENT THERAPY: Second line treatment with immunotherapy with Nivolumab 240 MG IV every 2 weeks, first dose 08/15/2015. Status post 6 cycles.  INTERVAL HISTORY: Sean Cox 82 y.o. male returns to the clinic today for follow-up visit. He is feeling fine today with no specific complaints except for mild low back pain improved with Tylenol. He also has fatigue and weakness of the lower extremities. He tolerated the last cycle of his treatment with immunotherapy fairly well with no significant adverse effects. He has no significant change from last visit. He denied having any significant fever or chills. He has no nausea, vomiting, diarrhea but has occasional constipation and uses Senokot. He denied having any significant skin rash.. The patient denied having any significant chest pain, shortness of breath, cough or hemoptysis. He is here today to start cycle #7 of his immunotherapy.  MEDICAL HISTORY: Past Medical History  Diagnosis Date  . Depression   . Hypothyroidism   . Hypercholesteremia   . Glaucoma   . Prostate cancer ([facility])   . Ulcerative proctitis ([facility])   . DDD (degenerative disc disease), lumbosacral   .  Anxiety   . IBS (irritable bowel syndrome)   . Detached retina   . Impaired fasting glucose   . Heart murmur     recently dx'ed with this 4 weeks ago  . Hypertension     no longer on medications  . GERD (gastroesophageal reflux disease)     in his younger years  . Malignant neoplasm of right upper lobe of lung ([place] [place]) 02/14/2015    ALLERGIES:  is allergic to codeine; imuran; remicade; simvastatin; and sulfa antibiotics.  MEDICATIONS:  Current Outpatient Prescriptions  Medication Sig Dispense Refill  . acetaminophen (TYLENOL) 500 MG tablet Take 1,000 mg by mouth every 4 (four) hours as needed for mild pain, moderate pain, fever or headache.    . APRISO 0.375 G 24 hr capsule Take 8 capsules by mouth daily.   11  . atorvastatin (LIPITOR) 20 MG tablet Take 20 mg by mouth daily.   1  . dorzolamide (TRUSOPT) 2 % ophthalmic solution Place 1 drop into both eyes 3 (three) times daily.  3  . levothyroxine (SYNTHROID, LEVOTHROID) 25 MCG tablet Take 25 mcg by mouth daily.   1  . PARoxetine (PAXIL) 40 MG tablet TAKE 1 TABLET BY MOUTH DAILY  3  . prochlorperazine (COMPAZINE) 10 MG tablet Take 1 tablet (10 mg total) by mouth every 6 (six) hours as needed for nausea or vomiting. 30 tablet 0  . senna (SENOKOT) 8.6 MG tablet Take 1 tablet by mouth daily as needed for constipation. Reported on 09/26/2015    . timolol (TIMOPTIC) 0.5 % ophthalmic solution Place 1 drop into both eyes 3 (three) times daily.  Reported on 08/30/2015  3  . traMADol (ULTRAM) 50 MG tablet Take 1-2 tablets (50-100 mg total) by mouth every 6 (six) hours as needed (pain). 40 tablet 0   No current facility-administered medications for this visit.    SURGICAL HISTORY:  Past Surgical History  Procedure Laterality Date  . Colon surgery    . Prostatectomy      2004- Dr Risa Grill  . Tonsillectomy    . Diverticular stricture removed      1992  . Fatty tumor excision      in chest  . Chest tube insertion Right 03/01/2015    Procedure:  INSERTION PLEURAL DRAINAGE CATHETER RIGHT CHEST;  Surgeon: [name], MD;  Location: [facility];  Service: Thoracic;  Laterality: Right;  . [place] pleurodesis Right 04/26/2015    Procedure: Pietro Cassis;  Surgeon: [name], MD;  Location: [facility];  Service: Thoracic;  Laterality: Right;  . Removal of pleural drainage catheter Right 04/26/2015    Procedure: REMOVAL OF PLEURAL DRAINAGE CATHETER;  Surgeon: [name], MD;  Location: [facility];  Service: Thoracic;  Laterality: Right;    REVIEW OF SYSTEMS:  A comprehensive review of systems was negative except for: Constitutional: positive for fatigue Musculoskeletal: positive for back pain and muscle weakness   PHYSICAL EXAMINATION: General appearance: alert, cooperative, fatigued and no distress Head: Normocephalic, without obvious abnormality, atraumatic Neck: no adenopathy, no JVD, supple, symmetrical, trachea midline and thyroid not enlarged, symmetric, no tenderness/mass/nodules Lymph nodes: Cervical, supraclavicular, and axillary nodes normal. Resp: clear to auscultation bilaterally Back: symmetric, no curvature. ROM normal. No CVA tenderness. Cardio: regular rate and rhythm, S1, S2 normal, no murmur, click, rub or gallop GI: soft, non-tender; bowel sounds normal; no masses,  no organomegaly Extremities: extremities normal, atraumatic, no cyanosis or edema Neurologic: Alert and oriented X 3, normal strength and tone. Normal symmetric reflexes. Normal coordination and gait  ECOG PERFORMANCE STATUS: 1 - Symptomatic but completely ambulatory  Blood pressure 120/80, pulse 66, temperature 98 F (36.7 C), temperature source Oral, resp. rate 18, height '5\' 8"'$  (1.727 m), weight 179 lb 3.2 oz (81.285 kg).  LABORATORY DATA: Lab Results  Component Value Date   WBC 6.4 11/07/2015   HGB 12.0* 11/07/2015   HCT 37.5* 11/07/2015   MCV 89.9 11/07/2015   PLT 208 11/07/2015      Chemistry      Component Value Date/Time    NA 139 11/07/2015 1143   NA 140 05/23/2015 1725   K 4.4 11/07/2015 1143   K 4.3 05/23/2015 1725   CL 100* 05/23/2015 1725   CO2 24 11/07/2015 1143   CO2 31 05/23/2015 1725   BUN 14.1 11/07/2015 1143   BUN 28* 05/23/2015 1725   CREATININE 0.8 11/07/2015 1143   CREATININE 0.88 05/23/2015 1725      Component Value Date/Time   CALCIUM 9.6 11/07/2015 1143   CALCIUM 9.6 05/23/2015 1725   ALKPHOS 89 11/07/2015 1143   ALKPHOS 70 05/23/2015 1725   AST 8 11/07/2015 1143   AST 14* 05/23/2015 1725   ALT <9 11/07/2015 1143   ALT 11* 05/23/2015 1725   BILITOT 0.37 11/07/2015 1143   BILITOT 0.5 05/23/2015 1725       RADIOGRAPHIC STUDIES: Ct Chest W Contrast  10/09/2015  CLINICAL DATA:  Right upper lobe lung carcinoma diagnosed August 2016 presenting for restaging on antineoplastic immunotherapy. EXAM: CT CHEST, ABDOMEN, AND PELVIS WITH CONTRAST TECHNIQUE: Multidetector CT imaging of the chest, abdomen and pelvis was  performed following the standard protocol during bolus administration of intravenous contrast. CONTRAST:  146m ISOVUE-300 IOPAMIDOL (ISOVUE-300) INJECTION 61% COMPARISON:  07/18/2015 CT chest, abdomen and pelvis. FINDINGS: CT CHEST Mediastinum/Nodes: Normal heart size. No pericardial fluid/thickening. Mild aortic and mitral annular calcification. Great vessels are normal in course and caliber. No central pulmonary emboli. Normal visualized thyroid. Normal esophagus. No axillary adenopathy. No pathologically enlarged mediastinal or hilar nodes. Previously described enlarged 1.4 cm posterior right hilar node now measures 0.5 cm, significantly decreased. Lungs/Pleura: No pneumothorax. Stable small right pleural effusion with linear pleural calcification in the basilar right pleural space, unchanged. No left pleural effusion. Medial right upper lobe irregular 5.5 x 4.3 cm lung mass (series 4/image 30), previously 5.1 x 3.8 cm, increased in size. New subpleural 3 mm basilar right upper lobe  pulmonary nodule (series 4/ image 80). A few additional scattered subcentimeter pulmonary nodules in both lungs are stable, largest 7 mm in the left lower lobe (series 4/image 87 and series 4/ image 105). No acute consolidative airspace disease. Musculoskeletal: No aggressive appearing focal osseous lesions. Moderate degenerative changes in the thoracic spine. CT ABDOMEN AND PELVIS Hepatobiliary: Stable granulomatous calcification in the left liver dome. Otherwise normal liver with no liver masses. Normal gallbladder with no radiopaque cholelithiasis. No biliary ductal dilatation. Pancreas: Normal, with no mass or duct dilation. Spleen: Normal size. No mass. Adrenals/Urinary Tract: Normal right adrenal. Stable 1.9 x 1.8 cm left adrenal metastasis (series 2/ image 572. No hydronephrosis. Normal bladder. Stomach/Bowel: Grossly normal stomach. Normal caliber small bowel with no small bowel wall thickening. Normal appendix. Colonic anastomosis is again noted in the distal sigmoid region. No large bowel wall thickening or pericolonic fat stranding. Oral contrast progresses to the distal colon. Vascular/Lymphatic: Atherosclerotic abdominal aorta with stable 3.4 cm infrarenal abdominal aortic aneurysm. Patent portal, splenic and renal veins. No pathologically enlarged lymph nodes in the abdomen or pelvis. Reproductive: Status post prostatectomy. No fluid collection or mass in the prostatectomy bed. Other: No pneumoperitoneum, ascites or focal fluid collection. Musculoskeletal: No aggressive appearing focal osseous lesions. Moderate to severe degenerative changes in the lumbar spine. IMPRESSION: 1. Primary right upper lobe malignancy has increased in size. 2. New tiny subpleural right upper lobe pulmonary nodule, indeterminate. Additional subcentimeter bilateral pulmonary nodules are stable. 3. Stable small malignant right pleural effusion. 4. Stable left adrenal metastasis. No additional sites of metastatic disease in the  abdomen or pelvis. 5. Stable 3.4 cm infrarenal abdominal aortic aneurysm. Electronically Signed   By: JIlona SorrelM.D.   On: 10/09/2015 17:38   Ct Abdomen Pelvis W Contrast  10/09/2015  CLINICAL DATA:  Right upper lobe lung carcinoma diagnosed August 2016 presenting for restaging on antineoplastic immunotherapy. EXAM: CT CHEST, ABDOMEN, AND PELVIS WITH CONTRAST TECHNIQUE: Multidetector CT imaging of the chest, abdomen and pelvis was performed following the standard protocol during bolus administration of intravenous contrast. CONTRAST:  1087mISOVUE-300 IOPAMIDOL (ISOVUE-300) INJECTION 61% COMPARISON:  07/18/2015 CT chest, abdomen and pelvis. FINDINGS: CT CHEST Mediastinum/Nodes: Normal heart size. No pericardial fluid/thickening. Mild aortic and mitral annular calcification. Great vessels are normal in course and caliber. No central pulmonary emboli. Normal visualized thyroid. Normal esophagus. No axillary adenopathy. No pathologically enlarged mediastinal or hilar nodes. Previously described enlarged 1.4 cm posterior right hilar node now measures 0.5 cm, significantly decreased. Lungs/Pleura: No pneumothorax. Stable small right pleural effusion with linear pleural calcification in the basilar right pleural space, unchanged. No left pleural effusion. Medial right upper lobe irregular 5.5 x 4.3 cm  lung mass (series 4/image 30), previously 5.1 x 3.8 cm, increased in size. New subpleural 3 mm basilar right upper lobe pulmonary nodule (series 4/ image 80). A few additional scattered subcentimeter pulmonary nodules in both lungs are stable, largest 7 mm in the left lower lobe (series 4/image 87 and series 4/ image 105). No acute consolidative airspace disease. Musculoskeletal: No aggressive appearing focal osseous lesions. Moderate degenerative changes in the thoracic spine. CT ABDOMEN AND PELVIS Hepatobiliary: Stable granulomatous calcification in the left liver dome. Otherwise normal liver with no liver masses.  Normal gallbladder with no radiopaque cholelithiasis. No biliary ductal dilatation. Pancreas: Normal, with no mass or duct dilation. Spleen: Normal size. No mass. Adrenals/Urinary Tract: Normal right adrenal. Stable 1.9 x 1.8 cm left adrenal metastasis (series 2/ image 56). No hydronephrosis. Normal bladder. Stomach/Bowel: Grossly normal stomach. Normal caliber small bowel with no small bowel wall thickening. Normal appendix. Colonic anastomosis is again noted in the distal sigmoid region. No large bowel wall thickening or pericolonic fat stranding. Oral contrast progresses to the distal colon. Vascular/Lymphatic: Atherosclerotic abdominal aorta with stable 3.4 cm infrarenal abdominal aortic aneurysm. Patent portal, splenic and renal veins. No pathologically enlarged lymph nodes in the abdomen or pelvis. Reproductive: Status post prostatectomy. No fluid collection or mass in the prostatectomy bed. Other: No pneumoperitoneum, ascites or focal fluid collection. Musculoskeletal: No aggressive appearing focal osseous lesions. Moderate to severe degenerative changes in the lumbar spine. IMPRESSION: 1. Primary right upper lobe malignancy has increased in size. 2. New tiny subpleural right upper lobe pulmonary nodule, indeterminate. Additional subcentimeter bilateral pulmonary nodules are stable. 3. Stable small malignant right pleural effusion. 4. Stable left adrenal metastasis. No additional sites of metastatic disease in the abdomen or pelvis. 5. Stable 3.4 cm infrarenal abdominal aortic aneurysm. Electronically Signed   By: Delbert Phenix M.D.   On: 10/09/2015 17:38   ASSESSMENT AND PLAN: This is a pleasant 82 years old white male recently diagnosed with metastatic non-small cell lung cancer, adenocarcinoma with negative EGFR mutation and negative ALK gene translocation.  He is currently undergoing systemic chemotherapy with carboplatin and Alimta status post 5 cycles.  He tolerated this treatment well except for the  visual disturbance and increased lacrimation. The recent CT scan of the chest, abdomen and pelvis showed mixed response with increase in the size of the right upper lobe lung mass but decrease in the size of the malignant right hilar lymph node. The patient is currently undergoing second line treatment with immunotherapy with Nivolumab status post 6 cycles. He tolerated the last cycle of his treatment fairly well with no significant adverse effects. I recommended for him to continue his current treatment with Nivolumab as a scheduled and he will proceed with cycle #7 today. The patient would come back for follow-up visit in 2 weeks for reevaluation before starting cycle #8.  For the back pain, I ordered MRI of the lumbar spine for evaluation. He was advised to continue on Tylenol or ibuprofen if needed. He was advised to call immediately if he has any concerning symptoms in the interval. The patient voices understanding of current disease status and treatment options and is in agreement with the current care plan.  All questions were answered. The patient knows to call the clinic with any problems, questions or concerns. We can certainly see the patient much sooner if necessary.  Disclaimer: This note was dictated with voice recognition software. Similar sounding words can inadvertently be transcribed and may not be corrected upon review.

## 2015-11-07 NOTE — Progress Notes (Signed)
Oncology Nurse Navigator Documentation  Oncology Nurse Navigator Flowsheets 11/07/2015  Navigator Location [facility]  Navigator Encounter Type Clinic/[facility]C  Patient Visit Type MedOnc  Treatment Phase Treatment  Education Other  Acuity Level 1  Acuity Level 1 Minimal follow up required  Time Spent with Patient 59   Spoke with patient today at [facility].  He is here today to get IO therapy.  He is doing well with c/o fatigue.  I updated Dr. Julien Nordmann.  I offered support and stated we are here if needed.

## 2015-11-14 ENCOUNTER — Ambulatory Visit (HOSPITAL_COMMUNITY)
Admission: RE | Admit: 2015-11-14 | Discharge: 2015-11-14 | Disposition: A | Discharge status: Home / Self Care | Payer: Medicare Other | Source: Ambulatory Visit | Attending: Internal Medicine | Admitting: Internal Medicine

## 2015-11-14 DIAGNOSIS — C3411 Malignant neoplasm of upper lobe, right bronchus or lung: Secondary | ICD-10-CM | POA: Diagnosis present

## 2015-11-14 DIAGNOSIS — M4806 Spinal stenosis, lumbar region: Secondary | ICD-10-CM | POA: Insufficient documentation

## 2015-11-14 DIAGNOSIS — Z5112 Encounter for antineoplastic immunotherapy: Secondary | ICD-10-CM | POA: Diagnosis not present

## 2015-11-14 DIAGNOSIS — M5136 Other intervertebral disc degeneration, lumbar region: Secondary | ICD-10-CM | POA: Diagnosis not present

## 2015-11-14 DIAGNOSIS — M5442 Lumbago with sciatica, left side: Secondary | ICD-10-CM

## 2015-11-14 DIAGNOSIS — C7951 Secondary malignant neoplasm of bone: Secondary | ICD-10-CM | POA: Insufficient documentation

## 2015-11-16 ENCOUNTER — Telehealth: Payer: Self-pay | Admitting: *Deleted

## 2015-11-16 NOTE — Telephone Encounter (Signed)
Per MD, called pt with MRI results, pt has some arthritis in addition previous places of concern seen on last scan. Pt verbalized understanding no concerns at this time.

## 2015-11-21 ENCOUNTER — Telehealth: Payer: Self-pay | Admitting: Internal Medicine

## 2015-11-21 ENCOUNTER — Other Ambulatory Visit (HOSPITAL_BASED_OUTPATIENT_CLINIC_OR_DEPARTMENT_OTHER): Payer: Medicare Other

## 2015-11-21 ENCOUNTER — Ambulatory Visit (HOSPITAL_BASED_OUTPATIENT_CLINIC_OR_DEPARTMENT_OTHER): Payer: Medicare Other

## 2015-11-21 ENCOUNTER — Encounter: Payer: Self-pay | Admitting: Internal Medicine

## 2015-11-21 ENCOUNTER — Ambulatory Visit (HOSPITAL_BASED_OUTPATIENT_CLINIC_OR_DEPARTMENT_OTHER): Payer: Medicare Other | Admitting: Internal Medicine

## 2015-11-21 VITALS — BP 98/61 | HR 64 | Temp 98.1°F | Resp 18 | Ht 68.0 in | Wt 178.2 lb

## 2015-11-21 VITALS — BP 122/80

## 2015-11-21 DIAGNOSIS — C7951 Secondary malignant neoplasm of bone: Secondary | ICD-10-CM | POA: Diagnosis not present

## 2015-11-21 DIAGNOSIS — C772 Secondary and unspecified malignant neoplasm of intra-abdominal lymph nodes: Secondary | ICD-10-CM

## 2015-11-21 DIAGNOSIS — C7972 Secondary malignant neoplasm of left adrenal gland: Secondary | ICD-10-CM

## 2015-11-21 DIAGNOSIS — C3411 Malignant neoplasm of upper lobe, right bronchus or lung: Secondary | ICD-10-CM | POA: Diagnosis not present

## 2015-11-21 DIAGNOSIS — Z5112 Encounter for antineoplastic immunotherapy: Secondary | ICD-10-CM

## 2015-11-21 LAB — CBC WITH DIFFERENTIAL/PLATELET
BASO%: 0.2 % (ref 0.0–2.0)
BASOS ABS: 0 10*3/uL (ref 0.0–0.1)
EOS%: 0.1 % (ref 0.0–7.0)
Eosinophils Absolute: 0 10*3/uL (ref 0.0–0.5)
HEMATOCRIT: 38.6 % (ref 38.4–49.9)
HEMOGLOBIN: 12.6 g/dL — AB (ref 13.0–17.1)
LYMPH#: 2 10*3/uL (ref 0.9–3.3)
LYMPH%: 29.5 % (ref 14.0–49.0)
MCH: 28.3 pg (ref 27.2–33.4)
MCHC: 32.6 g/dL (ref 32.0–36.0)
MCV: 87 fL (ref 79.3–98.0)
MONO#: 0.6 10*3/uL (ref 0.1–0.9)
MONO%: 8.4 % (ref 0.0–14.0)
NEUT#: 4.2 10*3/uL (ref 1.5–6.5)
NEUT%: 61.8 % (ref 39.0–75.0)
PLATELETS: 255 10*3/uL (ref 140–400)
RBC: 4.43 10*6/uL (ref 4.20–5.82)
RDW: 13.8 % (ref 11.0–14.6)
WBC: 6.7 10*3/uL (ref 4.0–10.3)

## 2015-11-21 LAB — COMPREHENSIVE METABOLIC PANEL
ALBUMIN: 3.5 g/dL (ref 3.5–5.0)
ALK PHOS: 95 U/L (ref 40–150)
ANION GAP: 8 meq/L (ref 3–11)
AST: 8 U/L (ref 5–34)
BUN: 15.7 mg/dL (ref 7.0–26.0)
CALCIUM: 10 mg/dL (ref 8.4–10.4)
CHLORIDE: 104 meq/L (ref 98–109)
CO2: 27 mEq/L (ref 22–29)
CREATININE: 0.8 mg/dL (ref 0.7–1.3)
EGFR: 82 mL/min/{1.73_m2} — ABNORMAL LOW (ref >90)
Glucose: 121 mg/dl (ref 70–140)
Potassium: 4.3 mEq/L (ref 3.5–5.1)
Sodium: 140 mEq/L (ref 136–145)
Total Bilirubin: 0.33 mg/dL (ref 0.20–1.20)
Total Protein: 7.8 g/dL (ref 6.4–8.3)

## 2015-11-21 MED ORDER — SODIUM CHLORIDE 0.9 % IV SOLN
Freq: Once | INTRAVENOUS | Status: AC
  Administered 2015-11-21: 13:00:00 via INTRAVENOUS

## 2015-11-21 MED ORDER — SODIUM CHLORIDE 0.9 % IV SOLN
240.0000 mg | Freq: Once | INTRAVENOUS | Status: AC
  Administered 2015-11-21: 240 mg via INTRAVENOUS
  Filled 2015-11-21: qty 24

## 2015-11-21 NOTE — Telephone Encounter (Signed)
Gave and printed appt sched andv avs for pt for June and July

## 2015-11-21 NOTE — Progress Notes (Signed)
[facility] [place] [phone]   [phone]) [phone]  OFFICE PROGRESS NOTE  [name], MD [place] [address] [place] [address]  DIAGNOSIS: Stage IV (T2b, N2, M1b) non-small cell lung cancer, adenocarcinoma, with negative EGFR mutation, presented with right upper lobe lung mass in addition to pleural based masses, mediastinal lymphadenopathy as well as metastatic disease to the left adrenal, bone and abdominal lymph nodes diagnosed in August 2016.  PRIOR THERAPY:  1) Status post right Pleurx catheter placement under the care of [name] on 03/01/2015. 2) Systemic chemotherapy with carboplatin for AUC of 5 and Alimta 500 MG/M2. First cycle on 03/14/2015. Status post 5 cycles, discontinued secondary to disease progression.    CURRENT THERAPY: Second line treatment with immunotherapy with Nivolumab 240 MG IV every 2 weeks, first dose 08/15/2015. Status post 7 cycles.  INTERVAL HISTORY: Sean Cox 82 y.o. male returns to the clinic today for follow-up visit. He is feeling fine today with no specific complaints except for mild low back pain improved with Tylenol. He had a recent MRI of the lumbar spine that showed diffuse osseous metastatic disease which has progressed compared to the previous PET scan but no clear evidence for pathologic fracture. There was also advanced L4-5 canal stenosis with bilateral L5 compression in the subarticular recesses. He also has fatigue and weakness of the lower extremities. He tolerated the last cycle of his treatment with immunotherapy fairly well with no significant adverse effects. He has no significant change from last visit. He denied having any significant fever or chills. He has no nausea, vomiting, diarrhea but has occasional constipation and uses Senokot. He denied having any significant skin rash.. The patient denied having any significant chest pain, shortness of breath, cough or hemoptysis. He is  here today to start cycle #8 of his immunotherapy.  MEDICAL HISTORY: Past Medical History  Diagnosis Date  . Depression   . Hypothyroidism   . Hypercholesteremia   . Glaucoma   . Prostate cancer ([place])   . Ulcerative proctitis ([place])   . DDD (degenerative disc disease), lumbosacral   . Anxiety   . IBS (irritable bowel syndrome)   . Detached retina   . Impaired fasting glucose   . Heart murmur     recently dx'ed with this 4 weeks ago  . Hypertension     no longer on medications  . GERD (gastroesophageal reflux disease)     in his younger years  . Malignant neoplasm of right upper lobe of lung ([place]) 02/14/2015  . Low back pain 11/07/2015    ALLERGIES:  is allergic to codeine; imuran; remicade; simvastatin; and sulfa antibiotics.  MEDICATIONS:  Current Outpatient Prescriptions  Medication Sig Dispense Refill  . acetaminophen (TYLENOL) 500 MG tablet Take 1,000 mg by mouth every 4 (four) hours as needed for mild pain, moderate pain, fever or headache.    . APRISO 0.375 G 24 hr capsule Take 8 capsules by mouth daily.   11  . atorvastatin (LIPITOR) 20 MG tablet Take 20 mg by mouth daily.   1  . dorzolamide (TRUSOPT) 2 % ophthalmic solution Place 1 drop into both eyes 3 (three) times daily.  3  . levothyroxine (SYNTHROID, LEVOTHROID) 25 MCG tablet Take 25 mcg by mouth daily.   1  . PARoxetine (PAXIL) 40 MG tablet TAKE 1 TABLET BY MOUTH DAILY  3  . prochlorperazine (COMPAZINE) 10 MG tablet Take 1 tablet (10 mg total) by mouth every  6 (six) hours as needed for nausea or vomiting. 30 tablet 0  . senna (SENOKOT) 8.6 MG tablet Take 1 tablet by mouth daily as needed for constipation. Reported on 09/26/2015    . timolol (TIMOPTIC) 0.5 % ophthalmic solution Place 1 drop into both eyes 3 (three) times daily. Reported on 08/30/2015  3  . traMADol (ULTRAM) 50 MG tablet Take 1-2 tablets (50-100 mg total) by mouth every 6 (six) hours as needed (pain). 40 tablet 0   No current facility-administered  medications for this visit.    SURGICAL HISTORY:  Past Surgical History  Procedure Laterality Date  . Colon surgery    . Prostatectomy      2004- Dr Risa Grill  . Tonsillectomy    . Diverticular stricture removed      1992  . Fatty tumor excision      in chest  . Chest tube insertion Right 03/01/2015    Procedure: INSERTION PLEURAL DRAINAGE CATHETER RIGHT CHEST;  Surgeon: [name], MD;  Location: [facility];  Service: Thoracic;  Laterality: Right;  . [place]c pleurodesis Right 04/26/2015    Procedure: Pietro Cassis;  Surgeon: [name], MD;  Location: [facility];  Service: Thoracic;  Laterality: Right;  . Removal of pleural drainage catheter Right 04/26/2015    Procedure: REMOVAL OF PLEURAL DRAINAGE CATHETER;  Surgeon: [name], MD;  Location: [facility];  Service: Thoracic;  Laterality: Right;    REVIEW OF SYSTEMS:  A comprehensive review of systems was negative except for: Constitutional: positive for fatigue Musculoskeletal: positive for back pain and muscle weakness   PHYSICAL EXAMINATION: General appearance: alert, cooperative, fatigued and no distress Head: Normocephalic, without obvious abnormality, atraumatic Neck: no adenopathy, no JVD, supple, symmetrical, trachea midline and thyroid not enlarged, symmetric, no tenderness/mass/nodules Lymph nodes: Cervical, supraclavicular, and axillary nodes normal. Resp: clear to auscultation bilaterally Back: symmetric, no curvature. ROM normal. No CVA tenderness. Cardio: regular rate and rhythm, S1, S2 normal, no murmur, click, rub or gallop GI: soft, non-tender; bowel sounds normal; no masses,  no organomegaly Extremities: extremities normal, atraumatic, no cyanosis or edema Neurologic: Alert and oriented X 3, normal strength and tone. Normal symmetric reflexes. Normal coordination and gait  ECOG PERFORMANCE STATUS: 1 - Symptomatic but completely ambulatory  Blood pressure 98/61, pulse 64, temperature 98.1 F (36.7  C), temperature source Oral, resp. rate 18, height '5\' 8"'$  (1.727 m), weight 178 lb 3.2 oz (80.831 kg), SpO2 95 %.  LABORATORY DATA: Lab Results  Component Value Date   WBC 6.7 11/21/2015   HGB 12.6* 11/21/2015   HCT 38.6 11/21/2015   MCV 87.0 11/21/2015   PLT 255 11/21/2015      Chemistry      Component Value Date/Time   NA 139 11/07/2015 1143   NA 140 05/23/2015 1725   K 4.4 11/07/2015 1143   K 4.3 05/23/2015 1725   CL 100* 05/23/2015 1725   CO2 24 11/07/2015 1143   CO2 31 05/23/2015 1725   BUN 14.1 11/07/2015 1143   BUN 28* 05/23/2015 1725   CREATININE 0.8 11/07/2015 1143   CREATININE 0.88 05/23/2015 1725      Component Value Date/Time   CALCIUM 9.6 11/07/2015 1143   CALCIUM 9.6 05/23/2015 1725   ALKPHOS 89 11/07/2015 1143   ALKPHOS 70 05/23/2015 1725   AST 8 11/07/2015 1143   AST 14* 05/23/2015 1725   ALT <9 11/07/2015 1143   ALT 11* 05/23/2015 1725   BILITOT 0.37 11/07/2015 1143  BILITOT 0.5 05/23/2015 1725       RADIOGRAPHIC STUDIES: Mr Lumbar Spine Wo Contrast  11/15/2015  CLINICAL DATA:  Persistent back pain.  History of lung cancer. EXAM: MRI LUMBAR SPINE WITHOUT CONTRAST TECHNIQUE: Multiplanar, multisequence MR imaging of the lumbar spine was performed. No intravenous contrast was administered. COMPARISON:  09/27/2004 FINDINGS: Segmentation:  Standard Alignment: Mild L2-3 retrolisthesis and L4-5 anterolisthesis, degenerative appearing. Vertebrae: There are infiltrating bone metastases throughout the visualized axial skeleton. Metastases are seen in the T10, T11, L1, L2, L4, S1, and S2 vertebrae. The largest lesions are in the T10 body and L1 body. No acute pathologic fracture is identified. No noncontrast evidence of extraosseous growth or impingement. Conus medullaris: Extends to the L1 level and appears normal. Paraspinal and other soft tissues: Known fusiform abdominal aortic aneurysm, up to 32 mm on this scan. Disc levels: T12-L1: Ventral spondylotic spurring.  Mild facet hypertrophy. No impingement. L1-L2: Bulky ventral spondylosis and disc narrowing. No herniation. Mild facet hypertrophy. No impingement. L2-L3: Disc: Advanced disc narrowing with ventral spondylosis and posterior endplate ridging. Right paracentral disc protrusion with inferior migration into the right subarticular recess. Facets: Arthropathy with bony and ligamentous overgrowth. Mild retrolisthesis Canal: Overall mild canal stenosis but the bilateral subarticular recesses are effaced. Either L3 nerve could be affected. Foramina: Narrowing without impingement. L3-L4: Disc: Narrowing with endplate spurring. Facets: Arthropathy with moderate bony and ligamentous overgrowth. Canal: Subarticular recess narrowing without impingement L4-L5: Disc: Bulging of the uncovered disc.  Mild disc narrowing. Facets: Severe arthropathy with spurring and anterolisthesis. Canal: High-grade canal stenosis with minimal residual CSF. Bilateral L5 impingement in the subarticular recesses. L5-S1: Mild facet hypertrophy.  No herniation or impingement. IMPRESSION: 1. Diffuse osseous metastatic disease which has progressed when compared to 02/16/2015 PET-CT. No pathologic fracture or evidence of canal/foraminal infiltration. 2. Advanced multilevel disc and facet degeneration. 3. Advanced L4-5 canal stenosis with bilateral L5 compression in the subarticular recesses. 4. Potentially symptomatic L2-3 bilateral subarticular recess stenosis. Electronically Signed   By: Marnee Spring M.D.   On: 11/15/2015 02:52   ASSESSMENT AND PLAN: This is a pleasant 82 years old white male recently diagnosed with metastatic non-small cell lung cancer, adenocarcinoma with negative EGFR mutation and negative ALK gene translocation.  He is currently undergoing systemic chemotherapy with carboplatin and Alimta status post 5 cycles.  He tolerated this treatment well except for the visual disturbance and increased lacrimation. The recent CT scan of  the chest, abdomen and pelvis showed mixed response with increase in the size of the right upper lobe lung mass but decrease in the size of the malignant right hilar lymph node. The patient is currently undergoing second line treatment with immunotherapy with Nivolumab status post 6 cycles. He tolerated the last cycle of his treatment fairly well with no significant adverse effects. I recommended for him to continue his current treatment with Nivolumab as a scheduled and he will proceed with cycle #8 today. The patient would come back for follow-up visit in 2 weeks for reevaluation before starting cycle #9 after repeating CT scan of the chest, abdomen and pelvis for restaging of his disease.  For the back pain, the MRI of the lumbar spine showed diffuse osseous metastatic disease which has progressed compared to his initial imaging studies. I discussed with the patient several options for management of his condition including close monitoring and pain management versus referral to radiation oncology for consideration of palliative radiotherapy to the progressive bone lesions. The patient would like to  hold on the palliative radiation for now but will consider it if the pain is getting worse. For now his pain is well managed with Tylenol extra strength. He was advised to call immediately if he has any concerning symptoms in the interval. The patient voices understanding of current disease status and treatment options and is in agreement with the current care plan.  All questions were answered. The patient knows to call the clinic with any problems, questions or concerns. We can certainly see the patient much sooner if necessary.  Disclaimer: This note was dictated with voice recognition software. Similar sounding words can inadvertently be transcribed and may not be corrected upon review.

## 2015-11-21 NOTE — Telephone Encounter (Signed)
Gave and printed appt sched and avs for pt for June and July  °

## 2015-11-21 NOTE — Patient Instructions (Signed)
[facility] Discharge Instructions for Patients Receiving Chemotherapy  Today you received the following chemotherapy agents Nivolumab.  To help prevent nausea and vomiting after your treatment, we encourage you to take your nausea medication as prescribed.   If you develop nausea and vomiting that is not controlled by your nausea medication, call the clinic.   BELOW ARE SYMPTOMS THAT SHOULD BE REPORTED IMMEDIATELY:  *FEVER GREATER THAN 100.5 F  *CHILLS WITH OR WITHOUT FEVER  NAUSEA AND VOMITING THAT IS NOT CONTROLLED WITH YOUR NAUSEA MEDICATION  *UNUSUAL SHORTNESS OF BREATH  *UNUSUAL BRUISING OR BLEEDING  TENDERNESS IN MOUTH AND THROAT WITH OR WITHOUT PRESENCE OF ULCERS  *URINARY PROBLEMS  *BOWEL PROBLEMS  UNUSUAL RASH Items with * indicate a potential emergency and should be followed up as soon as possible.  Feel free to call the clinic you have any questions or concerns. The clinic phone number is [phone].  Please show the CHEMO ALERT CARD at check-in to the Emergency Department and triage nurse.   

## 2015-11-27 ENCOUNTER — Telehealth: Payer: Self-pay | Admitting: *Deleted

## 2015-11-27 ENCOUNTER — Other Ambulatory Visit: Payer: Self-pay | Admitting: Internal Medicine

## 2015-11-27 DIAGNOSIS — C3411 Malignant neoplasm of upper lobe, right bronchus or lung: Secondary | ICD-10-CM

## 2015-11-27 MED ORDER — TRAMADOL HCL 50 MG PO TABS: 50.0000 mg | ORAL_TABLET | Freq: Four times a day (QID) | ORAL | Status: DC | PRN

## 2015-11-27 NOTE — Telephone Encounter (Signed)
1. "I'm having back pain that's getting worse.  I need something stronger than tylenol, ibuprofen.  Pain is a continuous dull muscle pain to my upper back that radiates around my rib cage.  Rate as a four right now on pain scale.  I do not want anything to put me in a stupor.    2. I was told I may need radiation after an MRI showed  and would like an appointment with a radiologist to discuss this further because I was told previously with my prostate cancer I could not receive radiation because of my intestines.   3. Return number [phone]."  Will notify provider Mr. Markwood is allergic to codeine.

## 2015-11-27 NOTE — Telephone Encounter (Signed)
I left message with his wife. She  asked me to call back in 5 minutes ," Sean Cox is walking the dog"

## 2015-11-27 NOTE — Telephone Encounter (Signed)
I called pt back and asked pt if he had any tramadol and he said he doesn't have anymore and he thinks he tolerated it well but it has been so long ago he does not remember.  I told him I called in tramadol and that Dr Julien Nordmann referred him to radiation oncology

## 2015-12-01 ENCOUNTER — Encounter (HOSPITAL_COMMUNITY): Payer: Self-pay

## 2015-12-01 ENCOUNTER — Ambulatory Visit (HOSPITAL_COMMUNITY)
Admission: RE | Admit: 2015-12-01 | Discharge: 2015-12-01 | Disposition: A | Discharge status: Home / Self Care | Payer: Medicare Other | Source: Ambulatory Visit | Attending: Internal Medicine | Admitting: Internal Medicine

## 2015-12-01 DIAGNOSIS — I714 Abdominal aortic aneurysm, without rupture: Secondary | ICD-10-CM | POA: Diagnosis not present

## 2015-12-01 DIAGNOSIS — Z5112 Encounter for antineoplastic immunotherapy: Secondary | ICD-10-CM

## 2015-12-01 DIAGNOSIS — C3411 Malignant neoplasm of upper lobe, right bronchus or lung: Secondary | ICD-10-CM | POA: Diagnosis not present

## 2015-12-01 DIAGNOSIS — R590 Localized enlarged lymph nodes: Secondary | ICD-10-CM | POA: Insufficient documentation

## 2015-12-01 DIAGNOSIS — J90 Pleural effusion, not elsewhere classified: Secondary | ICD-10-CM | POA: Insufficient documentation

## 2015-12-01 DIAGNOSIS — E278 Other specified disorders of adrenal gland: Secondary | ICD-10-CM | POA: Insufficient documentation

## 2015-12-01 MED ORDER — IOPAMIDOL (ISOVUE-300) INJECTION 61%
100.0000 mL | Freq: Once | INTRAVENOUS | Status: AC | PRN
  Administered 2015-12-01: 100 mL via INTRAVENOUS

## 2015-12-04 ENCOUNTER — Ambulatory Visit
Admission: RE | Admit: 2015-12-04 | Discharge: 2015-12-04 | Disposition: A | Discharge status: Home / Self Care | Payer: Medicare Other | Source: Ambulatory Visit | Attending: Radiation Oncology | Admitting: Radiation Oncology

## 2015-12-04 ENCOUNTER — Encounter: Payer: Self-pay | Admitting: Radiation Oncology

## 2015-12-04 VITALS — BP 103/89 | HR 84 | Resp 16 | Ht 68.0 in | Wt 176.4 lb

## 2015-12-04 DIAGNOSIS — R7301 Impaired fasting glucose: Secondary | ICD-10-CM | POA: Insufficient documentation

## 2015-12-04 DIAGNOSIS — E7800 Pure hypercholesterolemia, unspecified: Secondary | ICD-10-CM | POA: Insufficient documentation

## 2015-12-04 DIAGNOSIS — H3320 Serous retinal detachment, unspecified eye: Secondary | ICD-10-CM | POA: Diagnosis not present

## 2015-12-04 DIAGNOSIS — C7951 Secondary malignant neoplasm of bone: Secondary | ICD-10-CM

## 2015-12-04 DIAGNOSIS — H409 Unspecified glaucoma: Secondary | ICD-10-CM | POA: Diagnosis not present

## 2015-12-04 DIAGNOSIS — E039 Hypothyroidism, unspecified: Secondary | ICD-10-CM | POA: Insufficient documentation

## 2015-12-04 DIAGNOSIS — C3411 Malignant neoplasm of upper lobe, right bronchus or lung: Secondary | ICD-10-CM | POA: Diagnosis present

## 2015-12-04 DIAGNOSIS — M5137 Other intervertebral disc degeneration, lumbosacral region: Secondary | ICD-10-CM | POA: Insufficient documentation

## 2015-12-04 DIAGNOSIS — I10 Essential (primary) hypertension: Secondary | ICD-10-CM | POA: Diagnosis not present

## 2015-12-04 DIAGNOSIS — Z8546 Personal history of malignant neoplasm of prostate: Secondary | ICD-10-CM | POA: Diagnosis not present

## 2015-12-04 DIAGNOSIS — Z87891 Personal history of nicotine dependence: Secondary | ICD-10-CM | POA: Diagnosis not present

## 2015-12-04 DIAGNOSIS — K219 Gastro-esophageal reflux disease without esophagitis: Secondary | ICD-10-CM | POA: Insufficient documentation

## 2015-12-04 DIAGNOSIS — Z510 Encounter for antineoplastic radiation therapy: Secondary | ICD-10-CM | POA: Diagnosis not present

## 2015-12-04 DIAGNOSIS — F329 Major depressive disorder, single episode, unspecified: Secondary | ICD-10-CM | POA: Insufficient documentation

## 2015-12-04 NOTE — Progress Notes (Signed)
[place]         [phone] ________________________________  Initial Outpatient Consultation  Name: STALEY LUNZ MRN: [id number]  Date: 12/04/2015  DOB: September 30, 1932  REFERRING PHYSICIAN: Curt Bears, MD  DIAGNOSIS: 82 y.o. gentleman with stage IV non-small cell lung cancer with bone metastasis and painful involvement of T7 thoracic spine    ICD-9-CM ICD-10-CM   1. Malignant neoplasm of right upper lobe of lung (HCC) 162.3 C34.11     HISTORY OF PRESENT ILLNESS::Keyshawn ANDRON MARRAZZO is a 82 y.o. male who has a history of Stage IV (T2b, N2, M1b) non-small cell lung cancer, adenocarcinoma, with negative EGFR mutation and negative ALK gene translocation. He was initially seen by his PCP, Dr. Laurann Montana, in August 2016 for abdominal pain, constipation, and shortness of breast. Workup showed a right upper lobe lung mass in addition to pleural based masses, mediastinal lymphadenopathy as well as metastatic disease to the left adrenal, bone, and abdominal lymph nodes.  Past/Anticipated interventions by medical oncology, if any: Systemic chemotherapy with carboplatin for AUC of 5 and Alimta 500 MG/M2. First cycle on 03/14/2015. Status post 5 cycles, discontinued secondary to disease progression. CURRENT THERAPY: Second line treatment with immunotherapy with Nivolumab 240 MG IV every 2 weeks, first dose 08/15/2015. Status post 8 cycles.  The patient has lower back pain associated with known osseous metastases. MRI of the lumbar spine on 11/14/15 showed diffuse osseous metastatic disease which has progressed when compare to the PET scan from 02/16/15. Disease is noted in T10, T11, L1, L2, L4, S1, and S2. CT of the chest/abd/pelvis on 12/01/15 noted an interval progression of disease with the right upper lobe mass increasing in size from 4.3 x 5.5 cm to 4.7 x 6.0 cm, increase in right hilar adenopathy, and mild progression of bone metastases.  The patient has been referred to me to discuss  palliative radiotherapy to the progressive bone lesions.  PREVIOUS RADIATION THERAPY: No  Past Medical History  Diagnosis Date  . Depression   . Hypothyroidism   . Hypercholesteremia   . Glaucoma   . Prostate cancer ([place])   . Ulcerative proctitis ([place])   . DDD (degenerative disc disease), lumbosacral   . Anxiety   . IBS (irritable bowel syndrome)   . Detached retina   . Impaired fasting glucose   . Heart murmur     recently dx'ed with this 4 weeks ago  . Hypertension     no longer on medications  . GERD (gastroesophageal reflux disease)     in his younger years  . Malignant neoplasm of right upper lobe of lung ([place]) 02/14/2015  . Low back pain 11/07/2015  :  Past Surgical History  Procedure Laterality Date  . Colon surgery    . Prostatectomy      2004- Dr Risa Grill  . Tonsillectomy    . Diverticular stricture removed      1992  . Fatty tumor excision      in chest  . Chest tube insertion Right 03/01/2015    Procedure: INSERTION PLEURAL DRAINAGE CATHETER RIGHT CHEST;  Surgeon: [name], MD;  Location: [place];  Service: Thoracic;  Laterality: Right;  . [place] pleurodesis Right 04/26/2015    Procedure: Pietro Cassis;  Surgeon: [name], MD;  Location: [facility];  Service: Thoracic;  Laterality: Right;  . Removal of pleural drainage catheter Right 04/26/2015    Procedure: REMOVAL OF PLEURAL DRAINAGE CATHETER;  Surgeon: [name], MD;  Location:  [facility];  Service: Thoracic;  Laterality: Right;  :   Current outpatient prescriptions:  .  acetaminophen (TYLENOL) 500 MG tablet, Take 1,000 mg by mouth every 4 (four) hours as needed for mild pain, moderate pain, fever or headache., Disp: , Rfl:  .  APRISO 0.375 G 24 hr capsule, Take 8 capsules by mouth daily. , Disp: , Rfl: 11 .  atorvastatin (LIPITOR) 20 MG tablet, Take 20 mg by mouth daily. , Disp: , Rfl: 1 .  dorzolamide (TRUSOPT) 2 % ophthalmic solution, Place 1 drop into both eyes 3 (three) times  daily., Disp: , Rfl: 3 .  levothyroxine (SYNTHROID, LEVOTHROID) 25 MCG tablet, Take 25 mcg by mouth daily. , Disp: , Rfl: 1 .  PARoxetine (PAXIL) 40 MG tablet, TAKE 1 TABLET BY MOUTH DAILY, Disp: , Rfl: 3 .  prochlorperazine (COMPAZINE) 10 MG tablet, Take 1 tablet (10 mg total) by mouth every 6 (six) hours as needed for nausea or vomiting., Disp: 30 tablet, Rfl: 0 .  senna (SENOKOT) 8.6 MG tablet, Take 1 tablet by mouth daily as needed for constipation. Reported on 09/26/2015, Disp: , Rfl:  .  timolol (TIMOPTIC) 0.5 % ophthalmic solution, Place 1 drop into both eyes 3 (three) times daily. Reported on 08/30/2015, Disp: , Rfl: 3 .  traMADol (ULTRAM) 50 MG tablet, Take 1 tablet (50 mg total) by mouth every 6 (six) hours as needed (pain)., Disp: 40 tablet, Rfl: 0:  Allergies  Allergen Reactions  . Codeine Nausea Only    Nausea   . Imuran [Azathioprine] Other (See Comments)    Abnormal liver functions Pt unfamiliar with medication and would like to know who put that medication in.  . Remicade [Infliximab] Other (See Comments)    Muscle cramps   . Simvastatin Other (See Comments)    Headaches   . Sulfa Antibiotics Other (See Comments)    High fever  :  Family History  Problem Relation Age of Onset  . Cancer Neg Hx   :  Social History   Social History  . Marital Status: Married    Spouse Name: N/A  . Number of Children: N/A  . Years of Education: N/A   Occupational History  . Not on file.   Social History Main Topics  . Smoking status: Former Smoker -- 1.00 packs/day for 30 years    Types: Cigarettes    Quit date: 06/10/1981  . Smokeless tobacco: Never Used  . Alcohol Use: 1.8 oz/week    3 Cans of beer per week  . Drug Use: No  . Sexual Activity: No   Other Topics Concern  . Not on file   Social History Narrative  :  REVIEW OF SYSTEMS:  A 15 point review of systems is documented in the electronic medical record. This was obtained by the nursing staff. However, I  reviewed this with the patient to discuss relevant findings and make appropriate changes.  Pertinent items noted in HPI and remainder of comprehensive ROS otherwise negative.   The patient reports a cough and back pain locate below the left shoulder blade and wrapping around the left side. The patient denies hemoptysis or chest pain.   PHYSICAL EXAM:  Blood pressure 103/89, pulse 84, resp. rate 16, height '5\' 8"'$  (1.727 m), weight 176 lb 6.4 oz (80.015 kg), SpO2 100 %. Alert and oriented x 3. In no respiratory distress. Motor strength intact of the lower extremities, ambulation unremarkable.  KPS = 80  100 - Normal; no complaints;  no evidence of disease. 90   - Able to carry on normal activity; minor signs or symptoms of disease. 80   - Normal activity with effort; some signs or symptoms of disease. 5   - Cares for self; unable to carry on normal activity or to do active work. 60   - Requires occasional assistance, but is able to care for most of his personal needs. 50   - Requires considerable assistance and frequent medical care. 31   - Disabled; requires special care and assistance. 44   - Severely disabled; hospital admission is indicated although death not imminent. 56   - Very sick; hospital admission necessary; active supportive treatment necessary. 10   - Moribund; fatal processes progressing rapidly. 0     - Dead  Karnofsky DA, Abelmann [place], Craver LS and Burchenal JH [phone]) The use of the nitrogen mustards in the palliative treatment of carcinoma: with particular reference to bronchogenic carcinoma Cancer 1 [id number]  LABORATORY DATA:  Lab Results  Component Value Date   WBC 6.7 11/21/2015   HGB 12.6* 11/21/2015   HCT 38.6 11/21/2015   MCV 87.0 11/21/2015   PLT 255 11/21/2015   Lab Results  Component Value Date   NA 140 11/21/2015   K 4.3 11/21/2015   CL 100* 05/23/2015   CO2 27 11/21/2015   Lab Results  Component Value Date   ALT <9 11/21/2015   AST 8 11/21/2015    ALKPHOS 95 11/21/2015   BILITOT 0.33 11/21/2015     RADIOGRAPHY: Ct Chest W Contrast  12/01/2015  CLINICAL DATA:  Non-small cell lung cancer. EXAM: CT CHEST, ABDOMEN, AND PELVIS WITH CONTRAST TECHNIQUE: Multidetector CT imaging of the chest, abdomen and pelvis was performed following the standard protocol during bolus administration of intravenous contrast. CONTRAST:  17m ISOVUE-300 IOPAMIDOL (ISOVUE-300) INJECTION 61% COMPARISON:  10/09/2015 FINDINGS: CT CHEST FINDINGS Mediastinum/Lymph Nodes: There is normal heart size. No pericardial effusion identified. Aortic atherosclerosis. The trachea appears patent and is midline. Normal appearance of the esophagus. Right paratracheal lymph node measures 1.3 cm, image 24 of series 2. This is stable from previous exam. The index right hilar node measures 9 mm, image 25 of series 2. Previously 7 mm. Right suprahilar lymph node measures 1.3 cm, image 23 of series 2. Previously 7 mm. Lungs/Pleura: Small right pleural effusion identified. Right upper lobe lung mass measures 4.7 x 6.0 cm, image 40 of series 4. Previously 4.3 x 5.5 cm. Index nodule within the left lower lobe measures 6 mm, image 90 of series 4. Previously this measured the same. Perifissural nodule in the basilar portion of the right upper lobe measures 4 mm. This is unchanged when compared with previous exam. Musculoskeletal: Mixed lytic/sclerotic lesion involving the T11 vertebra is identified measuring 3 cm. On the previous examination this measured 2.2 cm. CT ABDOMEN PELVIS FINDINGS Hepatobiliary: No suspicious liver abnormalities identified. The gallbladder appears normal. There is no biliary dilatation. Pancreas: No mass, inflammatory changes, or other significant abnormality. Spleen: Within normal limits in size and appearance. Adrenals/Urinary Tract: Bilateral nodular enlargement of the adrenal glands. Left adrenal gland measures 1.6 x 2.0 cm, image 55 of series 2. Unchanged from previous exam. 1.3  cm nodule in the right adrenal gland is unchanged, image 57 of series 2. The kidneys are unremarkable. No obstructive uropathy. The urinary bladder is normal. Stomach/Bowel: The stomach is normal. The small bowel loops have a normal course and caliber. No pathologic dilatation of the colon. Vascular/Lymphatic: Aortic atherosclerosis noted.  The infrarenal abdominal aorta measures measures 3.1 cm in maximum AP dimension. No enlarged retroperitoneal or mesenteric adenopathy. No enlarged pelvic or inguinal lymph nodes. Reproductive: Previous prostatectomy. Other: There is no ascites or focal fluid collections within the abdomen or pelvis. Musculoskeletal: Mixed lytic/ sclerotic lesion involving the left iliac wing is unchanged, image 96 of series 2. There is a new lucent lesion within the left iliac wing measuring 1 cm, image 93 of series 2. IMPRESSION: 1. Interval progression of disease compared with 10/09/2015. 2. Increase in right hilar adenopathy. The right upper lobe lung mass has also increased in size in the interval. 3. Mild progression of bone metastases 4. Stable right pleural effusion and bilateral adrenal nodularity. 5. Unchanged infrarenal abdominal aortic aneurysm. Recommend followup by ultrasound in 3 years. This recommendation follows ACR consensus guidelines: White Paper of the ACR Incidental Findings Committee II on Vascular Findings. Natasha Mead Coll Radiol 2013; 10:789-794 Electronically Signed   By: Kerby Moors M.D.   On: 12/01/2015 16:28   Mr Lumbar Spine Wo Contrast  11/15/2015  CLINICAL DATA:  Persistent back pain.  History of lung cancer. EXAM: MRI LUMBAR SPINE WITHOUT CONTRAST TECHNIQUE: Multiplanar, multisequence MR imaging of the lumbar spine was performed. No intravenous contrast was administered. COMPARISON:  09/27/2004 FINDINGS: Segmentation:  Standard Alignment: Mild L2-3 retrolisthesis and L4-5 anterolisthesis, degenerative appearing. Vertebrae: There are infiltrating bone metastases  throughout the visualized axial skeleton. Metastases are seen in the T10, T11, L1, L2, L4, S1, and S2 vertebrae. The largest lesions are in the T10 body and L1 body. No acute pathologic fracture is identified. No noncontrast evidence of extraosseous growth or impingement. Conus medullaris: Extends to the L1 level and appears normal. Paraspinal and other soft tissues: Known fusiform abdominal aortic aneurysm, up to 32 mm on this scan. Disc levels: T12-L1: Ventral spondylotic spurring. Mild facet hypertrophy. No impingement. L1-L2: Bulky ventral spondylosis and disc narrowing. No herniation. Mild facet hypertrophy. No impingement. L2-L3: Disc: Advanced disc narrowing with ventral spondylosis and posterior endplate ridging. Right paracentral disc protrusion with inferior migration into the right subarticular recess. Facets: Arthropathy with bony and ligamentous overgrowth. Mild retrolisthesis Canal: Overall mild canal stenosis but the bilateral subarticular recesses are effaced. Either L3 nerve could be affected. Foramina: Narrowing without impingement. L3-L4: Disc: Narrowing with endplate spurring. Facets: Arthropathy with moderate bony and ligamentous overgrowth. Canal: Subarticular recess narrowing without impingement L4-L5: Disc: Bulging of the uncovered disc.  Mild disc narrowing. Facets: Severe arthropathy with spurring and anterolisthesis. Canal: High-grade canal stenosis with minimal residual CSF. Bilateral L5 impingement in the subarticular recesses. L5-S1: Mild facet hypertrophy.  No herniation or impingement. IMPRESSION: 1. Diffuse osseous metastatic disease which has progressed when compared to 02/16/2015 PET-CT. No pathologic fracture or evidence of canal/foraminal infiltration. 2. Advanced multilevel disc and facet degeneration. 3. Advanced L4-5 canal stenosis with bilateral L5 compression in the subarticular recesses. 4. Potentially symptomatic L2-3 bilateral subarticular recess stenosis. Electronically  Signed   By: Monte Fantasia M.D.   On: 11/15/2015 02:52   Ct Abdomen Pelvis W Contrast  12/01/2015  CLINICAL DATA:  Non-small cell lung cancer. EXAM: CT CHEST, ABDOMEN, AND PELVIS WITH CONTRAST TECHNIQUE: Multidetector CT imaging of the chest, abdomen and pelvis was performed following the standard protocol during bolus administration of intravenous contrast. CONTRAST:  181m ISOVUE-300 IOPAMIDOL (ISOVUE-300) INJECTION 61% COMPARISON:  10/09/2015 FINDINGS: CT CHEST FINDINGS Mediastinum/Lymph Nodes: There is normal heart size. No pericardial effusion identified. Aortic atherosclerosis. The trachea appears patent and is midline. Normal  appearance of the esophagus. Right paratracheal lymph node measures 1.3 cm, image 24 of series 2. This is stable from previous exam. The index right hilar node measures 9 mm, image 25 of series 2. Previously 7 mm. Right suprahilar lymph node measures 1.3 cm, image 23 of series 2. Previously 7 mm. Lungs/Pleura: Small right pleural effusion identified. Right upper lobe lung mass measures 4.7 x 6.0 cm, image 40 of series 4. Previously 4.3 x 5.5 cm. Index nodule within the left lower lobe measures 6 mm, image 90 of series 4. Previously this measured the same. Perifissural nodule in the basilar portion of the right upper lobe measures 4 mm. This is unchanged when compared with previous exam. Musculoskeletal: Mixed lytic/sclerotic lesion involving the T11 vertebra is identified measuring 3 cm. On the previous examination this measured 2.2 cm. CT ABDOMEN PELVIS FINDINGS Hepatobiliary: No suspicious liver abnormalities identified. The gallbladder appears normal. There is no biliary dilatation. Pancreas: No mass, inflammatory changes, or other significant abnormality. Spleen: Within normal limits in size and appearance. Adrenals/Urinary Tract: Bilateral nodular enlargement of the adrenal glands. Left adrenal gland measures 1.6 x 2.0 cm, image 55 of series 2. Unchanged from previous exam. 1.3  cm nodule in the right adrenal gland is unchanged, image 57 of series 2. The kidneys are unremarkable. No obstructive uropathy. The urinary bladder is normal. Stomach/Bowel: The stomach is normal. The small bowel loops have a normal course and caliber. No pathologic dilatation of the colon. Vascular/Lymphatic: Aortic atherosclerosis noted. The infrarenal abdominal aorta measures measures 3.1 cm in maximum AP dimension. No enlarged retroperitoneal or mesenteric adenopathy. No enlarged pelvic or inguinal lymph nodes. Reproductive: Previous prostatectomy. Other: There is no ascites or focal fluid collections within the abdomen or pelvis. Musculoskeletal: Mixed lytic/ sclerotic lesion involving the left iliac wing is unchanged, image 96 of series 2. There is a new lucent lesion within the left iliac wing measuring 1 cm, image 93 of series 2. IMPRESSION: 1. Interval progression of disease compared with 10/09/2015. 2. Increase in right hilar adenopathy. The right upper lobe lung mass has also increased in size in the interval. 3. Mild progression of bone metastases 4. Stable right pleural effusion and bilateral adrenal nodularity. 5. Unchanged infrarenal abdominal aortic aneurysm. Recommend followup by ultrasound in 3 years. This recommendation follows ACR consensus guidelines: White Paper of the ACR Incidental Findings Committee II on Vascular Findings. Alba Destine Coll Radiol 2013; 10:789-794 Electronically Signed   By: Signa Kell M.D.   On: 12/01/2015 16:28      IMPRESSION: 82 y.o. gentleman with stage IV non-small cell lung cancer with bone metastasis and painful involvement of T7 thoracic spine  The patient would be a good candidate for palliative radiation to T7 for pain control and preservation of the spinal cord function.  PLAN: Today, I talked to the patient and family about the findings and work-up thus far.  We discussed the natural history of stage IV non-small cell lung cancer with bone metastasis and  general treatment, highlighting the role of radiotherapy in the management.  We discussed the available radiation techniques, and focused on the details of logistics and delivery.  We reviewed the anticipated acute and late sequelae associated with radiation in this setting.  The patient was encouraged to ask questions that I answered to the best of my ability. The patient would like to proceed with radiation and has been scheduled for CT simulation on Wednesday, 12/06/15, at 11AM  I spent time minutes face to face  with the patient and more than 50% of that time was spent in counseling and/or coordination of care.   ------------------------------------------------  Sheral Apley. Tammi Klippel, M.D.  This document serves as a record of services personally performed by Tyler Pita, MD. It was created on his behalf by Darcus Austin, a trained medical scribe. The creation of this record is based on the scribe's personal observations and the provider's statements to them. This document has been checked and approved by the attending provider.

## 2015-12-04 NOTE — Progress Notes (Addendum)
Thoracic Location of Tumor / Histology: Stage IV (T2b, N2, M1b) non-small cell lung cancer, adenocarcinoma, with negative EGFR mutation, presented with right upper lobe lung mass in addition to pleural based masses, mediastinal lymphadenopathy as well as metastatic disease to the left adrenal, bone and abdominal lymph nodes diagnosed in August 2016  Patient presented to Dr. Julien Nordmann for follow up with symptoms of: mild low back pain, occasional constipation. Denied fevers, chills, nausea, vomiting, diarrhea, chest pain, SOB, cough or hemoptysis.    Tobacco/Marijuana/Snuff/ETOH use:   Past/Anticipated interventions by cardiothoracic surgery, if any: right pleurx catheter placed by [name] on 03/01/15  Past/Anticipated interventions by medical oncology, if any: Systemic chemotherapy with carboplatin for AUC of 5 and Alimta 500 MG/M2. First cycle on 03/14/2015. Status post 5 cycles, discontinued secondary to disease progression.  CURRENT THERAPY: Second line treatment with immunotherapy with Nivolumab 240 MG IV every 2 weeks, first dose 08/15/2015. Status post 7 cycles.  Signs/Symptoms  Weight changes, if any: 25 lb since September   Respiratory complaints, if any: no  Hemoptysis, if any: no  Pain issues, if any:  Reports dull throbbing back pain just under his left shoulder blade that radiates around his ribs and to his stomach. Reports taking tramadol and tylenol to manage this pain.   SAFETY ISSUES:  Prior radiation? Seen by Dr. Valere Dross in 2004 for prostate cancer. Patient states, "they didn't give me radiation back then, because I had ulcerative proctitis."   Pacemaker/ICD? no   Possible current pregnancy?no  Is the patient on methotrexate? no  Current Complaints / other details:  82 year old male. Patient with diffuse osseous metastatic disease which has progressed compared to previous PET causing mild low back pain. Also,last CT scan showed mixed response with increased in  the size of the right upper lobe lung mass but decrease in size of the malignant right hilar lymph node. Patient reports an unintentional weight loss of 25 lb since September 2016.

## 2015-12-04 NOTE — Progress Notes (Signed)
See progress note under physician encounter. 

## 2015-12-05 ENCOUNTER — Ambulatory Visit: Payer: Medicare Other | Admitting: Nurse Practitioner

## 2015-12-05 ENCOUNTER — Ambulatory Visit: Payer: Medicare Other

## 2015-12-05 ENCOUNTER — Other Ambulatory Visit: Payer: Medicare Other

## 2015-12-05 ENCOUNTER — Telehealth: Payer: Self-pay | Admitting: *Deleted

## 2015-12-05 NOTE — Telephone Encounter (Signed)
Oncology Nurse Navigator Documentation  Oncology Nurse Navigator Flowsheets 12/05/2015  Navigator Location [facility]  Navigator Encounter Type Telephone  Telephone Outgoing Call  Treatment Phase Treatment  Barriers/Navigation Needs Coordination of Care  Interventions Coordination of Care  Coordination of Care Appts  Acuity Level 1  Acuity Level 1 Minimal follow up required  Time Spent with Patient [place] NP asked if Dr. Julien Nordmann could see patient due to disease progression.  I updated Dr. Julien Nordmann.  He stated he could see the patient and to add him to schedule.  I updated Lattie Haw and scheduling.  I also called Mr. Sean Cox to update him as well.

## 2015-12-06 ENCOUNTER — Ambulatory Visit
Admission: RE | Admit: 2015-12-06 | Discharge: 2015-12-06 | Disposition: A | Discharge status: Home / Self Care | Payer: Medicare Other | Source: Ambulatory Visit | Attending: Radiation Oncology | Admitting: Radiation Oncology

## 2015-12-06 ENCOUNTER — Other Ambulatory Visit (HOSPITAL_BASED_OUTPATIENT_CLINIC_OR_DEPARTMENT_OTHER): Payer: Medicare Other

## 2015-12-06 ENCOUNTER — Ambulatory Visit: Payer: Medicare Other | Admitting: Nurse Practitioner

## 2015-12-06 ENCOUNTER — Ambulatory Visit (HOSPITAL_BASED_OUTPATIENT_CLINIC_OR_DEPARTMENT_OTHER): Payer: Medicare Other | Admitting: Internal Medicine

## 2015-12-06 ENCOUNTER — Telehealth: Payer: Self-pay | Admitting: Internal Medicine

## 2015-12-06 ENCOUNTER — Ambulatory Visit: Payer: Medicare Other

## 2015-12-06 ENCOUNTER — Encounter: Payer: Self-pay | Admitting: *Deleted

## 2015-12-06 ENCOUNTER — Encounter: Payer: Self-pay | Admitting: Internal Medicine

## 2015-12-06 VITALS — BP 121/84 | HR 72 | Temp 98.2°F | Resp 18 | Ht 68.0 in | Wt 175.3 lb

## 2015-12-06 DIAGNOSIS — C7951 Secondary malignant neoplasm of bone: Secondary | ICD-10-CM

## 2015-12-06 DIAGNOSIS — Z510 Encounter for antineoplastic radiation therapy: Secondary | ICD-10-CM | POA: Diagnosis not present

## 2015-12-06 DIAGNOSIS — M545 Low back pain: Secondary | ICD-10-CM | POA: Diagnosis not present

## 2015-12-06 DIAGNOSIS — C3411 Malignant neoplasm of upper lobe, right bronchus or lung: Secondary | ICD-10-CM

## 2015-12-06 LAB — CBC WITH DIFFERENTIAL/PLATELET
BASO%: 0.1 % (ref 0.0–2.0)
Basophils Absolute: 0 10*3/uL (ref 0.0–0.1)
EOS%: 0.1 % (ref 0.0–7.0)
Eosinophils Absolute: 0 10*3/uL (ref 0.0–0.5)
HCT: 37.4 % — ABNORMAL LOW (ref 38.4–49.9)
HGB: 12 g/dL — ABNORMAL LOW (ref 13.0–17.1)
LYMPH%: 33.4 % (ref 14.0–49.0)
MCH: 28.3 pg (ref 27.2–33.4)
MCHC: 32.1 g/dL (ref 32.0–36.0)
MCV: 88.2 fL (ref 79.3–98.0)
MONO#: 0.6 10*3/uL (ref 0.1–0.9)
MONO%: 7.2 % (ref 0.0–14.0)
NEUT#: 5.1 10*3/uL (ref 1.5–6.5)
NEUT%: 59.2 % (ref 39.0–75.0)
Platelets: 281 10*3/uL (ref 140–400)
RBC: 4.24 10*6/uL (ref 4.20–5.82)
RDW: 14.3 % (ref 11.0–14.6)
WBC: 8.7 10*3/uL (ref 4.0–10.3)
lymph#: 2.9 10*3/uL (ref 0.9–3.3)

## 2015-12-06 LAB — COMPREHENSIVE METABOLIC PANEL
ANION GAP: 10 meq/L (ref 3–11)
AST: 9 U/L (ref 5–34)
Albumin: 3.6 g/dL (ref 3.5–5.0)
Alkaline Phosphatase: 105 U/L (ref 40–150)
BUN: 16 mg/dL (ref 7.0–26.0)
CHLORIDE: 104 meq/L (ref 98–109)
CO2: 24 meq/L (ref 22–29)
CREATININE: 0.8 mg/dL (ref 0.7–1.3)
Calcium: 10.1 mg/dL (ref 8.4–10.4)
EGFR: 82 mL/min/{1.73_m2} — ABNORMAL LOW (ref >90)
GLUCOSE: 86 mg/dL (ref 70–140)
Potassium: 4.6 mEq/L (ref 3.5–5.1)
SODIUM: 138 meq/L (ref 136–145)
Total Bilirubin: 0.31 mg/dL (ref 0.20–1.20)
Total Protein: 8.3 g/dL (ref 6.4–8.3)

## 2015-12-06 MED ORDER — ACETAMINOPHEN 500 MG PO TABS
ORAL_TABLET | ORAL | Status: AC
  Filled 2015-12-06: qty 2

## 2015-12-06 MED ORDER — ACETAMINOPHEN 325 MG PO TABS
650.0000 mg | ORAL_TABLET | Freq: Once | ORAL | Status: AC
  Administered 2015-12-06: 650 mg via ORAL

## 2015-12-06 MED ORDER — ACETAMINOPHEN 325 MG PO TABS
ORAL_TABLET | ORAL | Status: AC
  Filled 2015-12-06: qty 2

## 2015-12-06 NOTE — Progress Notes (Signed)
Oncology Nurse Navigator Documentation  Oncology Nurse Navigator Flowsheets 12/06/2015  Navigator Location [facility]  Navigator Encounter Type Clinic/[facility]  Patient Visit Type [facility]  Treatment Phase Treatment  Barriers/Navigation Needs Education  Education Other  Interventions Education Method  Education Method Verbal  Acuity Level 1  Acuity Level 1 Minimal follow up required  Time Spent with Patient 30   I spoke with Sean Cox today.  He has disease progression.  He will be receiving radiation therapy next.  I helped to explain next steps.  Scheduling called and needed a clarifying POF, I completed

## 2015-12-06 NOTE — Telephone Encounter (Signed)
per pof to sch pt appt-per [place] to cancel all inf-gave pt copy of avs

## 2015-12-06 NOTE — Progress Notes (Signed)
[place] Telephone:(336) [phone]   Fax:(336) [phone]  OFFICE PROGRESS NOTE  [name], MD [place] [address] [place] [address]  DIAGNOSIS: Stage IV (T2b, N2, M1b) non-small cell lung cancer, adenocarcinoma, with negative EGFR mutation, presented with right upper lobe lung mass in addition to pleural based masses, mediastinal lymphadenopathy as well as metastatic disease to the left adrenal, bone and abdominal lymph nodes diagnosed in August 2016.  PRIOR THERAPY:  1) Status post right Pleurx catheter placement under the care of [name] on 03/01/2015. 2) Systemic chemotherapy with carboplatin for AUC of 5 and Alimta 500 MG/M2. First cycle on 03/14/2015. Status post 5 cycles, discontinued secondary to disease progression.  3) Second line treatment with immunotherapy with Nivolumab 240 MG IV every 2 weeks, first dose 08/15/2015. Status post 8 cycles. Last dose was given 11/21/2015, discontinued secondary to disease progression.   CURRENT THERAPY: Palliative radiotherapy to the right upper lobe lung mass in addition to the mediastinal lymphadenopathy and T11 metastatic bone lesion under the care of Dr. Tammi Klippel.  INTERVAL HISTORY: Sean Cox 82 y.o. male returns to the clinic today for follow-up visit. He is feeling fine today with no specific complaints except for persistent back pain. He is currently on tramadol twice daily in addition to Tylenol as needed. His previous MRI of the lumbar spine that showed diffuse osseous metastatic disease which has progressed compared to the previous PET scan but no clear evidence for pathologic fracture. He was seen by Dr. Tammi Klippel and expected to start palliative radiotherapy to the T11 metastatic bone lesions soon. He tolerated the last cycle of his treatment with immunotherapy fairly well with no significant adverse effects. He has no significant change from last visit. He denied having any significant  fever or chills. He has no nausea, vomiting, or diarrhea . He denied having any significant skin rash.. The patient denied having any significant chest pain, shortness of breath, cough or hemoptysis. He had repeat CT scan of the chest, abdomen and pelvis performed recently and he is here for evaluation and discussion of his scan results and treatment options.  MEDICAL HISTORY: Past Medical History  Diagnosis Date  . Depression   . Hypothyroidism   . Hypercholesteremia   . Glaucoma   . Prostate cancer ([facility])   . Ulcerative proctitis ([facility])   . DDD (degenerative disc disease), lumbosacral   . Anxiety   . IBS (irritable bowel syndrome)   . Detached retina   . Impaired fasting glucose   . Heart murmur     recently dx'ed with this 4 weeks ago  . Hypertension     no longer on medications  . GERD (gastroesophageal reflux disease)     in his younger years  . Malignant neoplasm of right upper lobe of lung ([facility]) 02/14/2015  . Low back pain 11/07/2015    ALLERGIES:  is allergic to codeine; imuran; remicade; simvastatin; and sulfa antibiotics.  MEDICATIONS:  Current Outpatient Prescriptions  Medication Sig Dispense Refill  . acetaminophen (TYLENOL) 500 MG tablet Take 1,000 mg by mouth every 4 (four) hours as needed for mild pain, moderate pain, fever or headache.    . APRISO 0.375 G 24 hr capsule Take 8 capsules by mouth daily.   11  . atorvastatin (LIPITOR) 20 MG tablet Take 20 mg by mouth daily.   1  . dorzolamide (TRUSOPT) 2 % ophthalmic solution Place 1 drop into both eyes 3 (three) times daily.  3  . levothyroxine (SYNTHROID, LEVOTHROID) 25 MCG tablet Take 25 mcg by mouth daily.   1  . PARoxetine (PAXIL) 40 MG tablet TAKE 1 TABLET BY MOUTH DAILY  3  . prochlorperazine (COMPAZINE) 10 MG tablet Take 1 tablet (10 mg total) by mouth every 6 (six) hours as needed for nausea or vomiting. 30 tablet 0  . senna (SENOKOT) 8.6 MG tablet Take 1 tablet by mouth daily as needed for constipation. Reported  on 09/26/2015    . timolol (TIMOPTIC) 0.5 % ophthalmic solution Place 1 drop into both eyes 3 (three) times daily. Reported on 08/30/2015  3  . traMADol (ULTRAM) 50 MG tablet Take 1 tablet (50 mg total) by mouth every 6 (six) hours as needed (pain). 40 tablet 0   No current facility-administered medications for this visit.    SURGICAL HISTORY:  Past Surgical History  Procedure Laterality Date  . Colon surgery    . Prostatectomy      2004- Dr Risa Grill  . Tonsillectomy    . Diverticular stricture removed      1992  . Fatty tumor excision      in chest  . Chest tube insertion Right 03/01/2015    Procedure: INSERTION PLEURAL DRAINAGE CATHETER RIGHT CHEST;  Surgeon: [name], MD;  Location: [facility];  Service: Thoracic;  Laterality: Right;  . [place] pleurodesis Right 04/26/2015    Procedure: Pietro Cassis;  Surgeon: [name], MD;  Location: [facility];  Service: Thoracic;  Laterality: Right;  . Removal of pleural drainage catheter Right 04/26/2015    Procedure: REMOVAL OF PLEURAL DRAINAGE CATHETER;  Surgeon: [name], MD;  Location: [facility];  Service: Thoracic;  Laterality: Right;    REVIEW OF SYSTEMS:  Constitutional: positive for fatigue Eyes: negative Ears, nose, mouth, throat, and face: negative Respiratory: negative Cardiovascular: negative Gastrointestinal: negative Genitourinary:negative Integument/breast: negative Hematologic/lymphatic: negative Musculoskeletal:positive for back pain Neurological: negative Behavioral/Psych: negative Endocrine: negative Allergic/Immunologic: negative   PHYSICAL EXAMINATION: General appearance: alert, cooperative, fatigued and no distress Head: Normocephalic, without obvious abnormality, atraumatic Neck: no adenopathy, no JVD, supple, symmetrical, trachea midline and thyroid not enlarged, symmetric, no tenderness/mass/nodules Lymph nodes: Cervical, supraclavicular, and axillary nodes normal. Resp: clear to  auscultation bilaterally Back: symmetric, no curvature. ROM normal. No CVA tenderness. Cardio: regular rate and rhythm, S1, S2 normal, no murmur, click, rub or gallop GI: soft, non-tender; bowel sounds normal; no masses,  no organomegaly Extremities: extremities normal, atraumatic, no cyanosis or edema Neurologic: Alert and oriented X 3, normal strength and tone. Normal symmetric reflexes. Normal coordination and gait  ECOG PERFORMANCE STATUS: 1 - Symptomatic but completely ambulatory  Blood pressure 121/84, pulse 72, temperature 98.2 F (36.8 C), temperature source Oral, resp. rate 18, height '5\' 8"'$  (1.727 m), weight 175 lb 4.8 oz (79.516 kg), SpO2 98 %.  LABORATORY DATA: Lab Results  Component Value Date   WBC 8.7 12/06/2015   HGB 12.0* 12/06/2015   HCT 37.4* 12/06/2015   MCV 88.2 12/06/2015   PLT 281 12/06/2015      Chemistry      Component Value Date/Time   NA 140 11/21/2015 1151   NA 140 05/23/2015 1725   K 4.3 11/21/2015 1151   K 4.3 05/23/2015 1725   CL 100* 05/23/2015 1725   CO2 27 11/21/2015 1151   CO2 31 05/23/2015 1725   BUN 15.7 11/21/2015 1151   BUN 28* 05/23/2015 1725   CREATININE 0.8 11/21/2015 1151   CREATININE 0.88 05/23/2015 1725  Component Value Date/Time   CALCIUM 10.0 11/21/2015 1151   CALCIUM 9.6 05/23/2015 1725   ALKPHOS 95 11/21/2015 1151   ALKPHOS 70 05/23/2015 1725   AST 8 11/21/2015 1151   AST 14* 05/23/2015 1725   ALT <9 11/21/2015 1151   ALT 11* 05/23/2015 1725   BILITOT 0.33 11/21/2015 1151   BILITOT 0.5 05/23/2015 1725       RADIOGRAPHIC STUDIES: Ct Chest W Contrast  12/01/2015  CLINICAL DATA:  Non-small cell lung cancer. EXAM: CT CHEST, ABDOMEN, AND PELVIS WITH CONTRAST TECHNIQUE: Multidetector CT imaging of the chest, abdomen and pelvis was performed following the standard protocol during bolus administration of intravenous contrast. CONTRAST:  118m ISOVUE-300 IOPAMIDOL (ISOVUE-300) INJECTION 61% COMPARISON:  10/09/2015  FINDINGS: CT CHEST FINDINGS Mediastinum/Lymph Nodes: There is normal heart size. No pericardial effusion identified. Aortic atherosclerosis. The trachea appears patent and is midline. Normal appearance of the esophagus. Right paratracheal lymph node measures 1.3 cm, image 24 of series 2. This is stable from previous exam. The index right hilar node measures 9 mm, image 25 of series 2. Previously 7 mm. Right suprahilar lymph node measures 1.3 cm, image 23 of series 2. Previously 7 mm. Lungs/Pleura: Small right pleural effusion identified. Right upper lobe lung mass measures 4.7 x 6.0 cm, image 40 of series 4. Previously 4.3 x 5.5 cm. Index nodule within the left lower lobe measures 6 mm, image 90 of series 4. Previously this measured the same. Perifissural nodule in the basilar portion of the right upper lobe measures 4 mm. This is unchanged when compared with previous exam. Musculoskeletal: Mixed lytic/sclerotic lesion involving the T11 vertebra is identified measuring 3 cm. On the previous examination this measured 2.2 cm. CT ABDOMEN PELVIS FINDINGS Hepatobiliary: No suspicious liver abnormalities identified. The gallbladder appears normal. There is no biliary dilatation. Pancreas: No mass, inflammatory changes, or other significant abnormality. Spleen: Within normal limits in size and appearance. Adrenals/Urinary Tract: Bilateral nodular enlargement of the adrenal glands. Left adrenal gland measures 1.6 x 2.0 cm, image 55 of series 2. Unchanged from previous exam. 1.3 cm nodule in the right adrenal gland is unchanged, image 57 of series 2. The kidneys are unremarkable. No obstructive uropathy. The urinary bladder is normal. Stomach/Bowel: The stomach is normal. The small bowel loops have a normal course and caliber. No pathologic dilatation of the colon. Vascular/Lymphatic: Aortic atherosclerosis noted. The infrarenal abdominal aorta measures measures 3.1 cm in maximum AP dimension. No enlarged retroperitoneal or  mesenteric adenopathy. No enlarged pelvic or inguinal lymph nodes. Reproductive: Previous prostatectomy. Other: There is no ascites or focal fluid collections within the abdomen or pelvis. Musculoskeletal: Mixed lytic/ sclerotic lesion involving the left iliac wing is unchanged, image 96 of series 2. There is a new lucent lesion within the left iliac wing measuring 1 cm, image 93 of series 2. IMPRESSION: 1. Interval progression of disease compared with 10/09/2015. 2. Increase in right hilar adenopathy. The right upper lobe lung mass has also increased in size in the interval. 3. Mild progression of bone metastases 4. Stable right pleural effusion and bilateral adrenal nodularity. 5. Unchanged infrarenal abdominal aortic aneurysm. Recommend followup by ultrasound in 3 years. This recommendation follows ACR consensus guidelines: White Paper of the ACR Incidental Findings Committee II on Vascular Findings. JNatasha MeadColl Radiol 2013; 10:789-794 Electronically Signed   By: TKerby MoorsM.D.   On: 12/01/2015 16:28   Mr Lumbar Spine Wo Contrast  11/15/2015  CLINICAL DATA:  Persistent back pain.  History  of lung cancer. EXAM: MRI LUMBAR SPINE WITHOUT CONTRAST TECHNIQUE: Multiplanar, multisequence MR imaging of the lumbar spine was performed. No intravenous contrast was administered. COMPARISON:  09/27/2004 FINDINGS: Segmentation:  Standard Alignment: Mild L2-3 retrolisthesis and L4-5 anterolisthesis, degenerative appearing. Vertebrae: There are infiltrating bone metastases throughout the visualized axial skeleton. Metastases are seen in the T10, T11, L1, L2, L4, S1, and S2 vertebrae. The largest lesions are in the T10 body and L1 body. No acute pathologic fracture is identified. No noncontrast evidence of extraosseous growth or impingement. Conus medullaris: Extends to the L1 level and appears normal. Paraspinal and other soft tissues: Known fusiform abdominal aortic aneurysm, up to 32 mm on this scan. Disc levels: T12-L1:  Ventral spondylotic spurring. Mild facet hypertrophy. No impingement. L1-L2: Bulky ventral spondylosis and disc narrowing. No herniation. Mild facet hypertrophy. No impingement. L2-L3: Disc: Advanced disc narrowing with ventral spondylosis and posterior endplate ridging. Right paracentral disc protrusion with inferior migration into the right subarticular recess. Facets: Arthropathy with bony and ligamentous overgrowth. Mild retrolisthesis Canal: Overall mild canal stenosis but the bilateral subarticular recesses are effaced. Either L3 nerve could be affected. Foramina: Narrowing without impingement. L3-L4: Disc: Narrowing with endplate spurring. Facets: Arthropathy with moderate bony and ligamentous overgrowth. Canal: Subarticular recess narrowing without impingement L4-L5: Disc: Bulging of the uncovered disc.  Mild disc narrowing. Facets: Severe arthropathy with spurring and anterolisthesis. Canal: High-grade canal stenosis with minimal residual CSF. Bilateral L5 impingement in the subarticular recesses. L5-S1: Mild facet hypertrophy.  No herniation or impingement. IMPRESSION: 1. Diffuse osseous metastatic disease which has progressed when compared to 02/16/2015 PET-CT. No pathologic fracture or evidence of canal/foraminal infiltration. 2. Advanced multilevel disc and facet degeneration. 3. Advanced L4-5 canal stenosis with bilateral L5 compression in the subarticular recesses. 4. Potentially symptomatic L2-3 bilateral subarticular recess stenosis. Electronically Signed   By: Monte Fantasia M.D.   On: 11/15/2015 02:52   Ct Abdomen Pelvis W Contrast  12/01/2015  CLINICAL DATA:  Non-small cell lung cancer. EXAM: CT CHEST, ABDOMEN, AND PELVIS WITH CONTRAST TECHNIQUE: Multidetector CT imaging of the chest, abdomen and pelvis was performed following the standard protocol during bolus administration of intravenous contrast. CONTRAST:  164m ISOVUE-300 IOPAMIDOL (ISOVUE-300) INJECTION 61% COMPARISON:  10/09/2015  FINDINGS: CT CHEST FINDINGS Mediastinum/Lymph Nodes: There is normal heart size. No pericardial effusion identified. Aortic atherosclerosis. The trachea appears patent and is midline. Normal appearance of the esophagus. Right paratracheal lymph node measures 1.3 cm, image 24 of series 2. This is stable from previous exam. The index right hilar node measures 9 mm, image 25 of series 2. Previously 7 mm. Right suprahilar lymph node measures 1.3 cm, image 23 of series 2. Previously 7 mm. Lungs/Pleura: Small right pleural effusion identified. Right upper lobe lung mass measures 4.7 x 6.0 cm, image 40 of series 4. Previously 4.3 x 5.5 cm. Index nodule within the left lower lobe measures 6 mm, image 90 of series 4. Previously this measured the same. Perifissural nodule in the basilar portion of the right upper lobe measures 4 mm. This is unchanged when compared with previous exam. Musculoskeletal: Mixed lytic/sclerotic lesion involving the T11 vertebra is identified measuring 3 cm. On the previous examination this measured 2.2 cm. CT ABDOMEN PELVIS FINDINGS Hepatobiliary: No suspicious liver abnormalities identified. The gallbladder appears normal. There is no biliary dilatation. Pancreas: No mass, inflammatory changes, or other significant abnormality. Spleen: Within normal limits in size and appearance. Adrenals/Urinary Tract: Bilateral nodular enlargement of the adrenal glands. Left adrenal gland measures  1.6 x 2.0 cm, image 55 of series 2. Unchanged from previous exam. 1.3 cm nodule in the right adrenal gland is unchanged, image 57 of series 2. The kidneys are unremarkable. No obstructive uropathy. The urinary bladder is normal. Stomach/Bowel: The stomach is normal. The small bowel loops have a normal course and caliber. No pathologic dilatation of the colon. Vascular/Lymphatic: Aortic atherosclerosis noted. The infrarenal abdominal aorta measures measures 3.1 cm in maximum AP dimension. No enlarged retroperitoneal or  mesenteric adenopathy. No enlarged pelvic or inguinal lymph nodes. Reproductive: Previous prostatectomy. Other: There is no ascites or focal fluid collections within the abdomen or pelvis. Musculoskeletal: Mixed lytic/ sclerotic lesion involving the left iliac wing is unchanged, image 96 of series 2. There is a new lucent lesion within the left iliac wing measuring 1 cm, image 93 of series 2. IMPRESSION: 1. Interval progression of disease compared with 10/09/2015. 2. Increase in right hilar adenopathy. The right upper lobe lung mass has also increased in size in the interval. 3. Mild progression of bone metastases 4. Stable right pleural effusion and bilateral adrenal nodularity. 5. Unchanged infrarenal abdominal aortic aneurysm. Recommend followup by ultrasound in 3 years. This recommendation follows ACR consensus guidelines: White Paper of the ACR Incidental Findings Committee II on Vascular Findings. Natasha Mead Coll Radiol 2013; 10:789-794 Electronically Signed   By: Kerby Moors M.D.   On: 12/01/2015 16:28   ASSESSMENT AND PLAN: This is a pleasant 82 years old white male recently diagnosed with metastatic non-small cell lung cancer, adenocarcinoma with negative EGFR mutation and negative ALK gene translocation.  He is currently undergoing systemic chemotherapy with carboplatin and Alimta status post 5 cycles.  He tolerated this treatment well except for the visual disturbance and increased lacrimation. The recent CT scan of the chest, abdomen and pelvis showed mixed response with increase in the size of the right upper lobe lung mass but decrease in the size of the malignant right hilar lymph node. The patient is currently undergoing second line treatment with immunotherapy with Nivolumab status post 8 cycles. He tolerated the last cycle of his treatment fairly well with no significant adverse effects. Unfortunately the recent CT scan of the chest, abdomen and pelvis showed evidence for disease progression  with increase in the size of the right upper lobe lung mass as well as the right hilar adenopathy. I discussed the scan results and showed the images to the patient today. I gave him several options for treatment of his condition including systemic chemotherapy with single agent Alimta versus consideration of palliative radiotherapy to the progressive lesion in the lung in addition to the T11 bone metastasis under the care of Dr. Tammi Klippel. The patient is interested in this option.  I will see him back for follow-up visit in 3 months for reevaluation with repeat CT scan of the chest, abdomen and pelvis for restaging of his disease. For pain management she will continue with his current pain medication with tramadol and Tylenol in addition to the palliative radiotherapy to the bone metastatic lesions. He was advised to call immediately if he has any concerning symptoms in the interval. The patient voices understanding of current disease status and treatment options and is in agreement with the current care plan.  All questions were answered. The patient knows to call the clinic with any problems, questions or concerns. We can certainly see the patient much sooner if necessary.  Disclaimer: This note was dictated with voice recognition software. Similar sounding words can inadvertently be  transcribed and may not be corrected upon review.

## 2015-12-06 NOTE — Progress Notes (Signed)
  Radiation Oncology         [phone] ________________________________  Name: Sean Cox MRN: [id number]  Date: 12/06/2015  DOB: 1932-06-16  SIMULATION AND TREATMENT PLANNING NOTE    ICD-9-CM ICD-10-CM   1. Spine metastasis ([facility]) 198.5 C79.51     DIAGNOSIS:  82 yo man with painful spine metastasis from stage IV Right Upper Lung Cancer  NARRATIVE:  The patient was brought to the [facility].  Identity was confirmed.  All relevant records and images related to the planned course of therapy were reviewed.  The patient freely provided informed written consent to proceed with treatment after reviewing the details related to the planned course of therapy. The consent form was witnessed and verified by the simulation staff.  Then, the patient was set-up in a stable reproducible  supine position for radiation therapy.  CT images were obtained.  Surface markings were placed.  The CT images were loaded into the planning software.  Then the target and avoidance structures were contoured.  Treatment planning then occurred.  The radiation prescription was entered and confirmed.  Then, I designed and supervised the construction of a total of 2 medically necessary complex treatment devices, MLCs to shield heart and lungs.  I have requested : Isodose Plan.  I have ordered:Nutrition Consult  PLAN:  The patient will receive 30 Gy in 10 fraction.  ________________________________  Sheral Apley Tammi Klippel, M.D.

## 2015-12-07 ENCOUNTER — Telehealth: Payer: Self-pay | Admitting: Medical Oncology

## 2015-12-07 DIAGNOSIS — Z510 Encounter for antineoplastic radiation therapy: Secondary | ICD-10-CM | POA: Diagnosis not present

## 2015-12-07 NOTE — Telephone Encounter (Signed)
Requested copy of CT from may 1st. Mailed to pt.

## 2015-12-11 ENCOUNTER — Telehealth: Payer: Self-pay | Admitting: Radiation Oncology

## 2015-12-11 NOTE — Telephone Encounter (Signed)
I called the patient to let him know that we knew about the adjustments in his treatment per Dr. Julien Nordmann and will treat his thoracic spine and lung tumor. He stated agreement and understanding.

## 2015-12-13 ENCOUNTER — Ambulatory Visit
Admission: RE | Admit: 2015-12-13 | Discharge: 2015-12-13 | Disposition: A | Discharge status: Home / Self Care | Payer: Medicare Other | Source: Ambulatory Visit | Attending: Radiation Oncology | Admitting: Radiation Oncology

## 2015-12-13 DIAGNOSIS — Z510 Encounter for antineoplastic radiation therapy: Secondary | ICD-10-CM | POA: Diagnosis not present

## 2015-12-14 ENCOUNTER — Ambulatory Visit
Admission: RE | Admit: 2015-12-14 | Discharge: 2015-12-14 | Disposition: A | Discharge status: Home / Self Care | Payer: Medicare Other | Source: Ambulatory Visit | Attending: Radiation Oncology | Admitting: Radiation Oncology

## 2015-12-14 DIAGNOSIS — Z510 Encounter for antineoplastic radiation therapy: Secondary | ICD-10-CM | POA: Diagnosis not present

## 2015-12-15 ENCOUNTER — Ambulatory Visit
Admission: RE | Admit: 2015-12-15 | Discharge: 2015-12-15 | Disposition: A | Discharge status: Home / Self Care | Payer: Medicare Other | Source: Ambulatory Visit | Attending: Radiation Oncology | Admitting: Radiation Oncology

## 2015-12-15 ENCOUNTER — Encounter: Payer: Self-pay | Admitting: Radiation Oncology

## 2015-12-15 VITALS — BP 114/83 | HR 72 | Resp 16 | Wt 175.5 lb

## 2015-12-15 DIAGNOSIS — C3411 Malignant neoplasm of upper lobe, right bronchus or lung: Secondary | ICD-10-CM

## 2015-12-15 DIAGNOSIS — Z510 Encounter for antineoplastic radiation therapy: Secondary | ICD-10-CM | POA: Diagnosis not present

## 2015-12-15 MED ORDER — TRAMADOL HCL 50 MG PO TABS: 50.0000 mg | ORAL_TABLET | ORAL | Status: DC | PRN

## 2015-12-15 MED ORDER — PROCHLORPERAZINE MALEATE 10 MG PO TABS: 10.0000 mg | ORAL_TABLET | Freq: Four times a day (QID) | ORAL | Status: DC | PRN

## 2015-12-15 NOTE — Progress Notes (Signed)
Department of Radiation Oncology  Phone:  [phone] Fax:        [phone]  Weekly Treatment Note    Name: [name] Date: 12/15/2015 MRN: [id number] DOB: 08/05/1932   Diagnosis:     ICD-9-CM ICD-10-CM   1. Malignant neoplasm of right upper lobe of lung (HCC) 162.3 C34.11 traMADol (ULTRAM) 50 MG tablet     Current dose: 9 Gy  Current fraction: 3   MEDICATIONS: Current Outpatient Prescriptions  Medication Sig Dispense Refill  . acetaminophen (TYLENOL) 500 MG tablet Take 1,000 mg by mouth every 4 (four) hours as needed for mild pain, moderate pain, fever or headache.    . APRISO 0.375 G 24 hr capsule Take 8 capsules by mouth daily.   11  . atorvastatin (LIPITOR) 20 MG tablet Take 20 mg by mouth daily.   1  . dorzolamide (TRUSOPT) 2 % ophthalmic solution Place 1 drop into both eyes 3 (three) times daily.  3  . levothyroxine (SYNTHROID, LEVOTHROID) 25 MCG tablet Take 25 mcg by mouth daily.   1  . PARoxetine (PAXIL) 40 MG tablet TAKE 1 TABLET BY MOUTH DAILY  3  . prochlorperazine (COMPAZINE) 10 MG tablet Take 1 tablet (10 mg total) by mouth every 6 (six) hours as needed for nausea or vomiting. 30 tablet 0  . timolol (TIMOPTIC) 0.5 % ophthalmic solution Place 1 drop into both eyes 3 (three) times daily. Reported on 08/30/2015  3  . traMADol (ULTRAM) 50 MG tablet Take 1-2 tablets (50-100 mg total) by mouth every 4 (four) hours as needed for moderate pain (pain). 60 tablet 0  . senna (SENOKOT) 8.6 MG tablet Take 1 tablet by mouth daily as needed for constipation. Reported on 12/15/2015     No current facility-administered medications for this encounter.     ALLERGIES: Codeine; Imuran; Remicade; Simvastatin; and Sulfa antibiotics   LABORATORY DATA:  Lab Results  Component Value Date   WBC 8.7 12/06/2015   HGB 12.0* 12/06/2015   HCT 37.4* 12/06/2015   MCV 88.2 12/06/2015   PLT 281 12/06/2015   Lab Results  Component Value Date   NA 138 12/06/2015   K 4.6  12/06/2015   CL 100* 05/23/2015   CO2 24 12/06/2015   Lab Results  Component Value Date   ALT <9 12/06/2015   AST 9 12/06/2015   ALKPHOS 105 12/06/2015   BILITOT 0.31 12/06/2015     NARRATIVE: Sean Cox was seen today for weekly treatment management. The chart was checked and the patient's films were reviewed.  Weight and vitals stable. Reports dull throbbing back pain just under his left shoulder blade that radiates around his ribs and to his stomach. Reports taking tramadol and tylenol to manage his pain. Patient requesting compazine be called into [facility] [address] to prevent nausea and vomiting.    PHYSICAL EXAMINATION: weight is 175 lb 8 oz (79.606 kg). His blood pressure is 114/83 and his pulse is 72. His respiration is 16 and oxygen saturation is 100%.      Alert and in no acute distress.   ASSESSMENT: The patient is doing satisfactorily with treatment.  PLAN: We will continue with the patient's radiation treatment as planned. A prescription for compazine was written today.      ------------------------------------------------  Jodelle Gross, MD, PhD  This document serves as a record of services personally performed by Kyung Rudd, MD and Shona Simpson, PA-C. It was created on his behalf by Arlyce Harman,  a trained medical scribe. The creation of this record is based on the scribe's personal observations and the provider's statements to them. This document has been checked and approved by the attending provider.

## 2015-12-15 NOTE — Progress Notes (Signed)
Weight and vitals stable. Reports dull throbbing back pain just under his left shoulder blade that radiates around his ribs and to his stomach. Reports taking tramadol and tylenol to manage his pain. Oriented patient to staff and routine of the clinic. Provided patient with RADIATION THERAPY AND YOU handbook then, reviewed pertinent information. Educated patient reference potential side effects and management such as skin changes, nausea/vomiting, and fatigue. Answered all patient questions to the best of my ability. Provided patient with my business card and encouraged him to call with needs. Patient verbalized understanding of all reviewed. Patient requesting compazine be called into [facility] [address] to prevent nausea and vomiting.   BP 114/83 mmHg  Pulse 72  Resp 16  Wt 175 lb 8 oz (79.606 kg)  SpO2 100% Wt Readings from Last 3 Encounters:  12/15/15 175 lb 8 oz (79.606 kg)  12/06/15 175 lb 4.8 oz (79.516 kg)  12/04/15 176 lb 6.4 oz (80.015 kg)

## 2015-12-18 ENCOUNTER — Ambulatory Visit
Admission: RE | Admit: 2015-12-18 | Discharge: 2015-12-18 | Disposition: A | Discharge status: Home / Self Care | Payer: Medicare Other | Source: Ambulatory Visit | Attending: Radiation Oncology | Admitting: Radiation Oncology

## 2015-12-18 ENCOUNTER — Encounter: Payer: Self-pay | Admitting: Radiation Oncology

## 2015-12-18 ENCOUNTER — Other Ambulatory Visit: Payer: Self-pay | Admitting: Radiation Oncology

## 2015-12-18 VITALS — BP 115/79 | HR 87 | Temp 97.6°F | Resp 18

## 2015-12-18 DIAGNOSIS — C7951 Secondary malignant neoplasm of bone: Secondary | ICD-10-CM

## 2015-12-18 DIAGNOSIS — C3411 Malignant neoplasm of upper lobe, right bronchus or lung: Secondary | ICD-10-CM

## 2015-12-18 DIAGNOSIS — Z510 Encounter for antineoplastic radiation therapy: Secondary | ICD-10-CM | POA: Diagnosis not present

## 2015-12-18 MED ORDER — HYDROCODONE-ACETAMINOPHEN 5-325 MG PO TABS: 1.0000 | ORAL_TABLET | ORAL | Status: DC | PRN

## 2015-12-18 NOTE — Progress Notes (Signed)
Patient requesting a "prescription for something stronger" to manage his back pain. Patient reports throbbing back pain just under his left shoulder blade that radiates around to his ribs and stomach. Patient states,"I don't like that pain scale all I know to tell you is it hurts." Reports tramadol "even two tablets" plus tylenol doesn't relieve his pain. Reports pain was so intense last night he didn't sleep. Patient is allergic to codeine. Shona Simpson, PA-C informed of this finding.   BP 115/79 mmHg  Pulse 87  Temp(Src) 97.6 F (36.4 C) (Oral)  Resp 18  SpO2 97%

## 2015-12-19 ENCOUNTER — Other Ambulatory Visit: Payer: Medicare Other

## 2015-12-19 ENCOUNTER — Ambulatory Visit
Admission: RE | Admit: 2015-12-19 | Discharge: 2015-12-19 | Disposition: A | Discharge status: Home / Self Care | Payer: Medicare Other | Source: Ambulatory Visit | Attending: Radiation Oncology | Admitting: Radiation Oncology

## 2015-12-19 ENCOUNTER — Ambulatory Visit: Payer: Medicare Other

## 2015-12-19 ENCOUNTER — Ambulatory Visit: Payer: Medicare Other | Admitting: Nurse Practitioner

## 2015-12-19 DIAGNOSIS — Z510 Encounter for antineoplastic radiation therapy: Secondary | ICD-10-CM | POA: Diagnosis not present

## 2015-12-19 NOTE — Progress Notes (Signed)
  Radiation Oncology         [phone]   Name: Sean Cox MRN: [id number]   Date: 12/18/2015  DOB: May 01, 1933   Weekly Radiation Therapy Management    ICD-9-CM ICD-10-CM   1. Malignant neoplasm of right upper lobe of lung (HCC) 162.3 C34.11   2. 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 The patient reports that the tramadol he has been taking has not helped. He was given a new prescription on Friday last week for Tramadol which he had been taking. He did not fill this and has requested something stronger. He describes pain in his mid left back which radiates below his scapula around towards his lateral chest wall and continues anteriorly. He denies any numbness at this level, weakness of his lower extremities, or new sites of pain. No other complaints are noted.   Physical Findings  oral temperature is 97.6 F (36.4 C). His blood pressure is 115/79 and his pulse is 87. His respiration is 18 and oxygen saturation is 97%.   Pain scale unable to verbalize because he does not "like the pain scale" system.  In general this is a well appearing caucasian male in no acute distress. He's alert and oriented x4 and appropriate throughout the examination. Cardiopulmonary assessment is negative for acute distress and he exhibits normal effort. No reproducible pain is noted with palpation over the thoracic spine.     Impression Pain due to metastatic disease to the spine without clinical neurologic compromise.  Plan Continue treatment as planned. He will return on Friday for his undertreatment visit with Dr. Tammi Klippel. I have provided a new prescription for Norco and instructed the patient on the side effect profile. Although he has a codeine allergy listed, he does not recall if he had significant nausea at the time of taking it, but understands the risk of cross-sensitivity of hydrocodone and codeine. He is interested in trying this  and will try to use antiemetics along with this. He will keep [place] informed of questions or concerns with the medication prior to Friday this week.       Carola Rhine, PAC

## 2015-12-20 ENCOUNTER — Other Ambulatory Visit: Payer: Medicare Other

## 2015-12-20 ENCOUNTER — Ambulatory Visit: Payer: Medicare Other

## 2015-12-20 ENCOUNTER — Ambulatory Visit
Admission: RE | Admit: 2015-12-20 | Discharge: 2015-12-20 | Disposition: A | Discharge status: Home / Self Care | Payer: Medicare Other | Source: Ambulatory Visit | Attending: Radiation Oncology | Admitting: Radiation Oncology

## 2015-12-20 ENCOUNTER — Ambulatory Visit: Payer: Medicare Other | Admitting: Nurse Practitioner

## 2015-12-20 DIAGNOSIS — Z510 Encounter for antineoplastic radiation therapy: Secondary | ICD-10-CM | POA: Diagnosis not present

## 2015-12-21 ENCOUNTER — Telehealth: Payer: Self-pay | Admitting: Medical Oncology

## 2015-12-21 ENCOUNTER — Ambulatory Visit
Admission: RE | Admit: 2015-12-21 | Discharge: 2015-12-21 | Disposition: A | Discharge status: Home / Self Care | Payer: Medicare Other | Source: Ambulatory Visit | Attending: Radiation Oncology | Admitting: Radiation Oncology

## 2015-12-21 ENCOUNTER — Encounter: Payer: Self-pay | Admitting: *Deleted

## 2015-12-21 DIAGNOSIS — Z510 Encounter for antineoplastic radiation therapy: Secondary | ICD-10-CM | POA: Diagnosis not present

## 2015-12-21 NOTE — Telephone Encounter (Signed)
MR Buenger requests to speak to Dr Worthy Flank PA about the treatment process. I told him Dr Julien Nordmann does not have a PA and I can have a nurse meet with him. He agreed to that.

## 2015-12-21 NOTE — Progress Notes (Signed)
Oncology Nurse Navigator Documentation  Oncology Nurse Navigator Flowsheets 12/21/2015  Navigator Location [facility]  Navigator Encounter Type Clinic/MDC  Patient Visit Type MedOnc  Treatment Phase Treatment  Barriers/Navigation Needs Education  Education Other  Interventions Education Method  Education Method Verbal  Acuity Level 2  Acuity Level 2 Educational needs  Time Spent with Patient 82   Sean Cox requested to see me after his radiation treatment.  I sat down and spoke with him.  I listened as he explained his concerns.  I educated him on radiation therapy and next steps.  He was thankful for me taking time to talk with him.

## 2015-12-22 ENCOUNTER — Ambulatory Visit
Admission: RE | Admit: 2015-12-22 | Discharge: 2015-12-22 | Disposition: A | Discharge status: Home / Self Care | Payer: Medicare Other | Source: Ambulatory Visit | Attending: Radiation Oncology | Admitting: Radiation Oncology

## 2015-12-22 VITALS — BP 121/82 | HR 90 | Resp 18 | Wt 171.4 lb

## 2015-12-22 DIAGNOSIS — C7951 Secondary malignant neoplasm of bone: Secondary | ICD-10-CM

## 2015-12-22 DIAGNOSIS — Z510 Encounter for antineoplastic radiation therapy: Secondary | ICD-10-CM | POA: Diagnosis not present

## 2015-12-22 MED ORDER — GABAPENTIN 300 MG PO CAPS: 300.0000 mg | ORAL_CAPSULE | Freq: Every day | ORAL | Status: DC

## 2015-12-22 NOTE — Progress Notes (Signed)
  Radiation Oncology         [phone]   Name: Sean Cox MRN: [id number]   Date: 12/22/2015  DOB: 01/02/1933     Weekly Radiation Therapy Management    ICD-9-CM ICD-10-CM   1. 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 He reports appetite loss, "nothing tastes good". Reports weight loss. He has not yet tried Ensure or Boost. He continues to experience back pain. He tried taking Norco to relieve his pain but refers to it as being too strong.  Set-up films were reviewed. The chart was checked.  Physical Findings  vitals were not taken for this visit.. Weight essentially stable.  No significant changes. Alert and in no acute distress.  Impression The patient is tolerating radiation.  Plan Continue treatment as planned. The patient was prescribed Neurontin today for pain management. I have referred the patient to nutrition.          Sheral Apley Tammi Klippel, M.D.  This document serves as a record of services personally performed by Tyler Pita, MD and Shona Simpson, PAC. It was created on his behalf by Arlyce Harman, a trained medical scribe. The creation of this record is based on the scribe's personal observations and the provider's statements to them. This document has been checked and approved by the attending provider.

## 2015-12-25 ENCOUNTER — Ambulatory Visit
Admission: RE | Admit: 2015-12-25 | Discharge: 2015-12-25 | Disposition: A | Discharge status: Home / Self Care | Payer: Medicare Other | Source: Ambulatory Visit | Attending: Radiation Oncology | Admitting: Radiation Oncology

## 2015-12-25 DIAGNOSIS — Z510 Encounter for antineoplastic radiation therapy: Secondary | ICD-10-CM | POA: Diagnosis not present

## 2015-12-25 NOTE — Addendum Note (Signed)
Encounter addended by: Heywood Footman, RN on: 12/25/2015  1:06 PM<BR>     Documentation filed: Vitals Section

## 2015-12-26 ENCOUNTER — Encounter: Payer: Self-pay | Admitting: Radiation Oncology

## 2015-12-26 ENCOUNTER — Ambulatory Visit
Admission: RE | Admit: 2015-12-26 | Discharge: 2015-12-26 | Disposition: A | Discharge status: Home / Self Care | Payer: Medicare Other | Source: Ambulatory Visit | Attending: Radiation Oncology | Admitting: Radiation Oncology

## 2015-12-26 DIAGNOSIS — Z510 Encounter for antineoplastic radiation therapy: Secondary | ICD-10-CM | POA: Diagnosis not present

## 2015-12-27 ENCOUNTER — Telehealth: Payer: Self-pay | Admitting: Radiation Oncology

## 2015-12-27 NOTE — Telephone Encounter (Signed)
Phoned patient. Informed him of one month follow up appointment with Shona Simpson, PA-C on August 15th at 3pm. Patient verbalized understanding.

## 2015-12-28 NOTE — Progress Notes (Signed)
  Radiation Oncology         [phone]) [phone] ________________________________  Name: Sean Cox MRN: [id number]  Date: 12/26/2015  DOB: 20-Jan-1933  End of Treatment Note  Diagnosis: 82 y.o.  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 The patient's main complaints were severe mid-back pain, nausea, and emesis.  Plan: The patient has completed radiation treatment. The patient will return to radiation oncology clinic for routine followup in one month. I advised him to call or return sooner if he has any questions or concerns related to his recovery or treatment. ________________________________  Sheral Apley. Tammi Klippel, M.D.  This document serves as a record of services personally performed by Tyler Pita, MD. It was created on his behalf by Darcus Austin, a trained medical scribe. The creation of this record is based on the scribe's personal observations and the provider's statements to them. This document has been checked and approved by the attending provider.

## 2015-12-29 ENCOUNTER — Telehealth: Payer: Self-pay | Admitting: *Deleted

## 2015-12-29 NOTE — Telephone Encounter (Signed)
Pt called states "I'm concerned about not having any chemotherapy from July to Oct." Pt advised he is finished with Radiotherapy for the back. Pt states "Starting the first of July nobody is treating the cancer that  has metastasized from the main tumor. We are doing nothing systemic. Someone should tell me how much the tumor has shrunk and all the other things in my body should be treated with systemic chemotherapy. I don't know why I'm going back to radiation in 30 days.  And I don't understand why we are waiting until October for more chemo and another scan. "  Pt very upset states "the cancer is running through my body, and I'm sitting at home worried the cancer could kill me any day. Pt would like to know why he cannot have additional chemo.  Listened to pt's concerns, discussed MD is out of the office, I will review with MD pt concerns upon his return on Monday.  Pt states " I want to know how long can I go without any treatment before I fall over dead? Is there a Chemotherapy that I can start taking now to shrink the tumor? Why are we waiting to see what a CT scan show in October?"  Again discussed with pt I will give MD these concerns and he will be called back on Monday with additional information and clarification. No further concerns.

## 2015-12-29 NOTE — Telephone Encounter (Signed)
CALLED PATIENT TO ASK ABOUT SCHEDULING A NUTRITION APPT. FOR HIM, HE ASKED THAT I CALL HIM THE FIRST OF NEXT WEEK

## 2016-01-01 ENCOUNTER — Encounter: Payer: Self-pay | Admitting: *Deleted

## 2016-01-01 ENCOUNTER — Telehealth: Payer: Self-pay | Admitting: *Deleted

## 2016-01-01 NOTE — Telephone Encounter (Signed)
Oncology Nurse Navigator Documentation  Oncology Nurse Navigator Flowsheets 01/01/2016  Navigator Location [facility]  Navigator Encounter Type Telephone  Telephone Outgoing Call  Treatment Phase Other  Barriers/Navigation Needs Coordination of Care  Interventions Coordination of Care  Coordination of Care Appts  Acuity Level 2  Time Spent with Patient 91   Per Dr. Worthy Flank request, I called the patient.  I gave him an appt to see Dr. Julien Nordmann tomorrow at 2:30.  He verbalized understanding of appt time and place.

## 2016-01-02 ENCOUNTER — Telehealth: Payer: Self-pay | Admitting: Internal Medicine

## 2016-01-02 ENCOUNTER — Other Ambulatory Visit: Payer: Medicare Other

## 2016-01-02 ENCOUNTER — Encounter: Payer: Self-pay | Admitting: Internal Medicine

## 2016-01-02 ENCOUNTER — Ambulatory Visit: Payer: Medicare Other | Admitting: Internal Medicine

## 2016-01-02 ENCOUNTER — Ambulatory Visit (HOSPITAL_BASED_OUTPATIENT_CLINIC_OR_DEPARTMENT_OTHER): Payer: Medicare Other | Admitting: Internal Medicine

## 2016-01-02 ENCOUNTER — Ambulatory Visit: Payer: Medicare Other

## 2016-01-02 VITALS — BP 106/70 | HR 87 | Temp 97.6°F | Resp 17 | Ht 68.0 in | Wt 168.0 lb

## 2016-01-02 DIAGNOSIS — C7951 Secondary malignant neoplasm of bone: Secondary | ICD-10-CM

## 2016-01-02 DIAGNOSIS — C3411 Malignant neoplasm of upper lobe, right bronchus or lung: Secondary | ICD-10-CM | POA: Diagnosis not present

## 2016-01-02 DIAGNOSIS — C7972 Secondary malignant neoplasm of left adrenal gland: Secondary | ICD-10-CM | POA: Diagnosis not present

## 2016-01-02 DIAGNOSIS — C772 Secondary and unspecified malignant neoplasm of intra-abdominal lymph nodes: Secondary | ICD-10-CM

## 2016-01-02 DIAGNOSIS — G893 Neoplasm related pain (acute) (chronic): Secondary | ICD-10-CM

## 2016-01-02 NOTE — Telephone Encounter (Signed)
Gave pt cal & avs °

## 2016-01-02 NOTE — Progress Notes (Signed)
[facility] [phone]   [phone]  OFFICE PROGRESS NOTE  [name], MD [place] [address] [place] [address]  DIAGNOSIS: Stage IV (T2b, N2, M1b) non-small cell lung cancer, adenocarcinoma, with negative EGFR mutation, presented with right upper lobe lung mass in addition to pleural based masses, mediastinal lymphadenopathy as well as metastatic disease to the left adrenal, bone and abdominal lymph nodes diagnosed in August 2016.  PRIOR THERAPY:  1) Status post right Pleurx catheter placement under the care of [name] on 03/01/2015. 2) Systemic chemotherapy with carboplatin for AUC of 5 and Alimta 500 MG/M2. First cycle on 03/14/2015. Status post 5 cycles, discontinued secondary to disease progression.  3) Second line treatment with immunotherapy with Nivolumab 240 MG IV every 2 weeks, first dose 08/15/2015. Status post 8 cycles. Last dose was given 11/21/2015, discontinued secondary to disease progression. 4) Palliative radiotherapy to the right upper lobe lung mass in addition to the mediastinal lymphadenopathy and T11 metastatic bone lesion under the care of Dr. Tammi Klippel completed 12/26/2015.  CURRENT THERAPY: Observation.  INTERVAL HISTORY: Sean Cox 82 y.o. male returns to the clinic today for follow-up visit. The patient requested the follow-up visit for more discussion of his condition and future plan. He is feeling fine except for fatigue. He also lost around 8 pounds since his last visit. He denied having any significant chest pain, shortness breath, cough or hemoptysis. The back pain had significantly improved. He denied having any nausea, vomiting, diarrhea but has occasional constipation. He has no headache or visual changes. He is about not having any systemic therapy at this point.  MEDICAL HISTORY: Past Medical History:  Diagnosis Date  . Anxiety   . DDD (degenerative disc disease), lumbosacral   .  Depression   . Detached retina   . GERD (gastroesophageal reflux disease)    in his younger years  . Glaucoma   . Heart murmur    recently dx'ed with this 4 weeks ago  . Hypercholesteremia   . Hypertension    no longer on medications  . Hypothyroidism   . IBS (irritable bowel syndrome)   . Impaired fasting glucose   . Low back pain 11/07/2015  . Malignant neoplasm of right upper lobe of lung ([place]) 02/14/2015  . Prostate cancer ([place])   . 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  MEDICATIONS:  Current Outpatient Prescriptions  Medication Sig Dispense Refill  . acetaminophen (TYLENOL) 500 MG tablet Take 1,000 mg by mouth every 4 (four) hours as needed for mild pain, moderate pain, fever or headache.    . APRISO 0.375 G 24 hr capsule Take 8 capsules by mouth daily.   11  . atorvastatin (LIPITOR) 20 MG tablet Take 20 mg by mouth daily.   1  . dorzolamide (TRUSOPT) 2 % ophthalmic solution Place 1 drop into both eyes 3 (three) times daily.  3  . gabapentin (NEURONTIN) 300 MG capsule Take 1 capsule (300 mg total) by mouth at bedtime. 90 capsule 0  . levothyroxine (SYNTHROID, LEVOTHROID) 25 MCG tablet Take 25 mcg by mouth daily.   1  . PARoxetine (PAXIL) 40 MG tablet TAKE 1 TABLET BY MOUTH DAILY  3  . prochlorperazine (COMPAZINE) 10 MG tablet Take 1 tablet (10 mg total) by mouth every 6 (six) hours as needed for nausea or vomiting. 30 tablet 0  .  senna (SENOKOT) 8.6 MG tablet Take 1 tablet by mouth daily as needed for constipation. Reported on 12/15/2015    . timolol (TIMOPTIC) 0.5 % ophthalmic solution Place 1 drop into both eyes 3 (three) times daily. Reported on 08/30/2015  3   No current facility-administered medications for this visit.     SURGICAL HISTORY:  Past Surgical History:  Procedure Laterality Date  . CHEST TUBE INSERTION Right 03/01/2015   Procedure: INSERTION PLEURAL  DRAINAGE CATHETER RIGHT CHEST;  Surgeon: [name], MD;  Location: [facility];  Service: Thoracic;  Laterality: Right;  . COLON SURGERY    . diverticular stricture removed     1992  . fatty tumor excision     in chest  . PROSTATECTOMY     2004- Dr Risa Grill  . REMOVAL OF PLEURAL DRAINAGE CATHETER Right 04/26/2015   Procedure: REMOVAL OF PLEURAL DRAINAGE CATHETER;  Surgeon: [name], MD;  Location: [facility];  Service: Thoracic;  Laterality: Right;  . TALC PLEURODESIS Right 04/26/2015   Procedure: Pietro Cassis;  Surgeon: [name], MD;  Location: [facility];  Service: Thoracic;  Laterality: Right;  . TONSILLECTOMY      REVIEW OF SYSTEMS:  Constitutional: positive for fatigue and weight loss Eyes: negative Ears, nose, mouth, throat, and face: negative Respiratory: negative Cardiovascular: negative Gastrointestinal: negative Genitourinary:negative Integument/breast: negative Hematologic/lymphatic: negative Musculoskeletal:positive for back pain Neurological: negative Behavioral/Psych: negative Endocrine: negative Allergic/Immunologic: negative   PHYSICAL EXAMINATION: General appearance: alert, cooperative, fatigued and no distress Head: Normocephalic, without obvious abnormality, atraumatic Neck: no adenopathy, no JVD, supple, symmetrical, trachea midline and thyroid not enlarged, symmetric, no tenderness/mass/nodules Lymph nodes: Cervical, supraclavicular, and axillary nodes normal. Resp: clear to auscultation bilaterally Back: symmetric, no curvature. ROM normal. No CVA tenderness. Cardio: regular rate and rhythm, S1, S2 normal, no murmur, click, rub or gallop GI: soft, non-tender; bowel sounds normal; no masses,  no organomegaly Extremities: extremities normal, atraumatic, no cyanosis or edema Neurologic: Alert and oriented X 3, normal strength and tone. Normal symmetric reflexes. Normal coordination and gait  ECOG PERFORMANCE STATUS: 1 - Symptomatic but  completely ambulatory  Blood pressure 106/70, pulse 87, temperature 97.6 F (36.4 C), temperature source Oral, resp. rate 17, height '5\' 8"'$  (1.727 m), weight 168 lb (76.2 kg), SpO2 95 %.  LABORATORY DATA: Lab Results  Component Value Date   WBC 8.7 12/06/2015   HGB 12.0 (L) 12/06/2015   HCT 37.4 (L) 12/06/2015   MCV 88.2 12/06/2015   PLT 281 12/06/2015      Chemistry      Component Value Date/Time   NA 138 12/06/2015 1319   K 4.6 12/06/2015 1319   CL 100 (L) 05/23/2015 1725   CO2 24 12/06/2015 1319   BUN 16.0 12/06/2015 1319   CREATININE 0.8 12/06/2015 1319      Component Value Date/Time   CALCIUM 10.1 12/06/2015 1319   ALKPHOS 105 12/06/2015 1319   AST 9 12/06/2015 1319   ALT <9 12/06/2015 1319   BILITOT 0.31 12/06/2015 1319       RADIOGRAPHIC STUDIES: No results found. ASSESSMENT AND PLAN: This is a pleasant 82 years old white male recently diagnosed with metastatic non-small cell lung cancer, adenocarcinoma with negative EGFR mutation and negative ALK gene translocation.  He is currently undergoing systemic chemotherapy with carboplatin and Alimta status post 5 cycles.  He tolerated this treatment well except for the visual disturbance and increased lacrimation. The recent CT scan of the chest, abdomen and pelvis showed  mixed response with increase in the size of the right upper lobe lung mass but decrease in the size of the malignant right hilar lymph node. The patient is currently undergoing second line treatment with immunotherapy with Nivolumab status post 8 cycles. He tolerated the last cycle of his treatment fairly well with no significant adverse effects. Unfortunately the recent CT scan of the chest, abdomen and pelvis showed evidence for disease progression with increase in the size of the right upper lobe lung mass as well as the right hilar adenopathy. I discussed the scan results and showed the images to the patient today. He underwent palliative radiotherapy  to the progressive lesion in the lung in addition to the T11 bone metastasis under the care of Dr. Tammi Klippel completed 12/26/2015. I had a lengthy discussion with the patient about his current condition and further treatment options. He is worried about not receiving any systemic chemotherapy at this point. I explained to the patient that the role of systemic chemotherapy is in nature and I would consider him for treatment if the upcoming scan showed any evidence for disease progression. I will arrange for the patient to have repeat CT scan of the chest, abdomen and pelvis in 3 weeks for reevaluation and restaging of his disease. For pain management she will continue with his current pain medication with tramadol and Tylenol for now. He agreed to the current plan. He was advised to call immediately if he has any concerning symptoms in the interval. The patient voices understanding of current disease status and treatment options and is in agreement with the current care plan.  All questions were answered. The patient knows to call the clinic with any problems, questions or concerns. We can certainly see the patient much sooner if necessary.  Disclaimer: This note was dictated with voice recognition software. Similar sounding words can inadvertently be transcribed and may not be corrected upon review.

## 2016-01-03 ENCOUNTER — Other Ambulatory Visit: Payer: Medicare Other

## 2016-01-03 ENCOUNTER — Ambulatory Visit: Payer: Medicare Other | Admitting: Internal Medicine

## 2016-01-03 ENCOUNTER — Ambulatory Visit: Payer: Medicare Other

## 2016-01-10 ENCOUNTER — Telehealth: Payer: Self-pay | Admitting: *Deleted

## 2016-01-10 NOTE — Telephone Encounter (Signed)
Oncology Nurse Navigator Documentation  Oncology Nurse Navigator Flowsheets 01/10/2016  Navigator Location [facility]  Navigator Encounter Type Telephone  Telephone Outgoing Call  Treatment Phase Other  Barriers/Navigation Needs Coordination of Care I called central scheduling to get an appt for his CT Chest/Abd/Pelvis.  I was given an appt.  I called Sean Cox.  He can't make this appt.  I called back to central scheduling and was given another appt.  I called Sean Cox back.  I updated him of appt.  He was thankful for the help.   Education Other/pre procedure instructions  Interventions Coordination of Care  Coordination of Care Appts;Radiology  Education Method Verbal  Acuity Level 2  Acuity Level 2 Assistance expediting appointments;Educational needs  Time Spent with Patient 81

## 2016-01-12 ENCOUNTER — Other Ambulatory Visit: Payer: Self-pay | Admitting: Medical Oncology

## 2016-01-12 ENCOUNTER — Telehealth: Payer: Self-pay | Admitting: Medical Oncology

## 2016-01-12 NOTE — Telephone Encounter (Signed)
Pt called about reason  for labs before CT scan. Explained need to assess renal function and for MD visit.

## 2016-01-15 ENCOUNTER — Telehealth: Payer: Self-pay | Admitting: Radiation Oncology

## 2016-01-15 ENCOUNTER — Telehealth: Payer: Self-pay | Admitting: Internal Medicine

## 2016-01-15 NOTE — Telephone Encounter (Signed)
Advised patient to call back so that I can give him his appointment day and time

## 2016-01-15 NOTE — Telephone Encounter (Signed)
pt called to resched lab to earlier, sched pt for 8/8 lab

## 2016-01-16 ENCOUNTER — Other Ambulatory Visit (HOSPITAL_BASED_OUTPATIENT_CLINIC_OR_DEPARTMENT_OTHER): Payer: Medicare Other

## 2016-01-16 DIAGNOSIS — C3411 Malignant neoplasm of upper lobe, right bronchus or lung: Secondary | ICD-10-CM

## 2016-01-16 LAB — COMPREHENSIVE METABOLIC PANEL
ALBUMIN: 3 g/dL — AB (ref 3.5–5.0)
ALK PHOS: 100 U/L (ref 40–150)
AST: 8 U/L (ref 5–34)
Anion Gap: 9 mEq/L (ref 3–11)
BILIRUBIN TOTAL: 0.36 mg/dL (ref 0.20–1.20)
BUN: 13.1 mg/dL (ref 7.0–26.0)
CO2: 28 mEq/L (ref 22–29)
CREATININE: 0.9 mg/dL (ref 0.7–1.3)
Calcium: 9.9 mg/dL (ref 8.4–10.4)
Chloride: 102 mEq/L (ref 98–109)
EGFR: 81 mL/min/{1.73_m2} — AB (ref >90)
GLUCOSE: 125 mg/dL (ref 70–140)
Potassium: 3.9 mEq/L (ref 3.5–5.1)
SODIUM: 139 meq/L (ref 136–145)
TOTAL PROTEIN: 7.7 g/dL (ref 6.4–8.3)

## 2016-01-16 LAB — CBC WITH DIFFERENTIAL/PLATELET
BASO%: 0.2 % (ref 0.0–2.0)
Basophils Absolute: 0 10*3/uL (ref 0.0–0.1)
EOS ABS: 0 10*3/uL (ref 0.0–0.5)
EOS%: 0.1 % (ref 0.0–7.0)
HCT: 34.7 % — ABNORMAL LOW (ref 38.4–49.9)
HEMOGLOBIN: 10.9 g/dL — AB (ref 13.0–17.1)
LYMPH%: 15.7 % (ref 14.0–49.0)
MCH: 27.3 pg (ref 27.2–33.4)
MCHC: 31.3 g/dL — ABNORMAL LOW (ref 32.0–36.0)
MCV: 87.3 fL (ref 79.3–98.0)
MONO#: 0.5 10*3/uL (ref 0.1–0.9)
MONO%: 9.9 % (ref 0.0–14.0)
NEUT%: 74.1 % (ref 39.0–75.0)
NEUTROS ABS: 3.5 10*3/uL (ref 1.5–6.5)
Platelets: 340 10*3/uL (ref 140–400)
RBC: 3.98 10*6/uL — ABNORMAL LOW (ref 4.20–5.82)
RDW: 16.1 % — AB (ref 11.0–14.6)
WBC: 4.7 10*3/uL (ref 4.0–10.3)
lymph#: 0.7 10*3/uL — ABNORMAL LOW (ref 0.9–3.3)

## 2016-01-18 ENCOUNTER — Ambulatory Visit (HOSPITAL_COMMUNITY): Payer: Medicare Other

## 2016-01-19 ENCOUNTER — Other Ambulatory Visit: Payer: Medicare Other

## 2016-01-19 ENCOUNTER — Ambulatory Visit (HOSPITAL_COMMUNITY)
Admission: RE | Admit: 2016-01-19 | Discharge: 2016-01-19 | Disposition: A | Discharge status: Home / Self Care | Payer: Medicare Other | Source: Ambulatory Visit | Attending: Internal Medicine | Admitting: Internal Medicine

## 2016-01-19 DIAGNOSIS — I700 Atherosclerosis of aorta: Secondary | ICD-10-CM | POA: Diagnosis not present

## 2016-01-19 DIAGNOSIS — C7951 Secondary malignant neoplasm of bone: Secondary | ICD-10-CM | POA: Diagnosis not present

## 2016-01-19 DIAGNOSIS — J90 Pleural effusion, not elsewhere classified: Secondary | ICD-10-CM | POA: Insufficient documentation

## 2016-01-19 DIAGNOSIS — I714 Abdominal aortic aneurysm, without rupture: Secondary | ICD-10-CM | POA: Diagnosis not present

## 2016-01-19 DIAGNOSIS — C3411 Malignant neoplasm of upper lobe, right bronchus or lung: Secondary | ICD-10-CM | POA: Diagnosis present

## 2016-01-19 DIAGNOSIS — E278 Other specified disorders of adrenal gland: Secondary | ICD-10-CM | POA: Insufficient documentation

## 2016-01-19 MED ORDER — IOPAMIDOL (ISOVUE-300) INJECTION 61%
100.0000 mL | Freq: Once | INTRAVENOUS | Status: AC | PRN
  Administered 2016-01-19: 100 mL via INTRAVENOUS

## 2016-01-22 ENCOUNTER — Ambulatory Visit (HOSPITAL_BASED_OUTPATIENT_CLINIC_OR_DEPARTMENT_OTHER): Payer: Medicare Other | Admitting: Internal Medicine

## 2016-01-22 ENCOUNTER — Telehealth: Payer: Self-pay | Admitting: *Deleted

## 2016-01-22 ENCOUNTER — Encounter: Payer: Self-pay | Admitting: Internal Medicine

## 2016-01-22 ENCOUNTER — Other Ambulatory Visit: Payer: Medicare Other

## 2016-01-22 VITALS — BP 116/75 | HR 96 | Temp 98.6°F | Resp 18 | Ht 68.0 in | Wt 168.5 lb

## 2016-01-22 DIAGNOSIS — C3411 Malignant neoplasm of upper lobe, right bronchus or lung: Secondary | ICD-10-CM

## 2016-01-22 DIAGNOSIS — C772 Secondary and unspecified malignant neoplasm of intra-abdominal lymph nodes: Secondary | ICD-10-CM | POA: Diagnosis not present

## 2016-01-22 DIAGNOSIS — C7972 Secondary malignant neoplasm of left adrenal gland: Secondary | ICD-10-CM | POA: Diagnosis not present

## 2016-01-22 DIAGNOSIS — Z5111 Encounter for antineoplastic chemotherapy: Secondary | ICD-10-CM

## 2016-01-22 DIAGNOSIS — C7951 Secondary malignant neoplasm of bone: Secondary | ICD-10-CM

## 2016-01-22 MED ORDER — CYANOCOBALAMIN 1000 MCG/ML IJ SOLN
1000.0000 ug | Freq: Once | INTRAMUSCULAR | Status: AC
Start: 2016-01-22 — End: 2016-01-22
  Administered 2016-01-22: 1000 ug via INTRAMUSCULAR

## 2016-01-22 MED ORDER — FOLIC ACID 1 MG PO TABS: 1.0000 mg | ORAL_TABLET | Freq: Every day | ORAL | 4 refills | Status: AC

## 2016-01-22 MED ORDER — DEXAMETHASONE 4 MG PO TABS: ORAL_TABLET | ORAL | 1 refills | Status: DC

## 2016-01-22 MED ORDER — CYANOCOBALAMIN 1000 MCG/ML IJ SOLN
INTRAMUSCULAR | Status: AC
  Filled 2016-01-22: qty 1

## 2016-01-22 NOTE — Progress Notes (Signed)
[place] [phone]   [phone]  OFFICE PROGRESS NOTE  [name], MD [address] [place] [address]  DIAGNOSIS: Stage IV (T2b, N2, M1b) non-small cell lung cancer, adenocarcinoma, with negative EGFR mutation, presented with right upper lobe lung mass in addition to pleural based masses, mediastinal lymphadenopathy as well as metastatic disease to the left adrenal, bone and abdominal lymph nodes diagnosed in August 2016.  PRIOR THERAPY:  1) Status post right Pleurx catheter placement under the care of [name] on 03/01/2015. 2) Systemic chemotherapy with carboplatin for AUC of 5 and Alimta 500 MG/M2. First cycle on 03/14/2015. Status post 5 cycles, discontinued secondary to disease progression.  3) Second line treatment with immunotherapy with Nivolumab 240 MG IV every 2 weeks, first dose 08/15/2015. Status post 8 cycles. Last dose was given 11/21/2015, discontinued secondary to disease progression. 4) Palliative radiotherapy to the right upper lobe lung mass in addition to the mediastinal lymphadenopathy and T11 metastatic bone lesion under the care of Dr. Tammi Klippel completed 12/26/2015.  CURRENT THERAPY: Systemic chemotherapy with Alimta 500 MG/M2 every 3 weeks. First dose 01/30/2016.  INTERVAL HISTORY: Sean Cox 82 y.o. male returns to the clinic today for follow-up visit. The patient is feeling fine today with no specific complaints except for fatigue and persistent upper back pain. He denied having any significant chest pain, shortness breath, cough or hemoptysis. The back pain had significantly improved. He denied having any nausea, vomiting, diarrhea but has occasional constipation. He has no headache or visual changes. He had repeat CT scan of the chest, abdomen and pelvis performed recently and he is here for evaluation and discussion of his scan results.  MEDICAL HISTORY: Past Medical History:    Diagnosis Date  . Anxiety   . DDD (degenerative disc disease), lumbosacral   . Depression   . Detached retina   . GERD (gastroesophageal reflux disease)    in his younger years  . Glaucoma   . Heart murmur    recently dx'ed with this 4 weeks ago  . Hypercholesteremia   . Hypertension    no longer on medications  . Hypothyroidism   . IBS (irritable bowel syndrome)   . Impaired fasting glucose   . Low back pain 11/07/2015  . Malignant neoplasm of right upper lobe of lung ([facility]) 02/14/2015  . Prostate cancer ([facility])   . 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  MEDICATIONS:  Current Outpatient Prescriptions  Medication Sig Dispense Refill  . acetaminophen (TYLENOL) 500 MG tablet Take 1,000 mg by mouth every 4 (four) hours as needed for mild pain, moderate pain, fever or headache.    . APRISO 0.375 G 24 hr capsule Take 8 capsules by mouth daily.   11  . atorvastatin (LIPITOR) 20 MG tablet Take 20 mg by mouth daily.   1  . dorzolamide (TRUSOPT) 2 % ophthalmic solution Place 1 drop into both eyes 3 (three) times daily.  3  . gabapentin (NEURONTIN) 300 MG capsule Take 1 capsule (300 mg total) by mouth at bedtime. 90 capsule 0  . levothyroxine (SYNTHROID, LEVOTHROID) 25 MCG tablet Take 25 mcg by mouth daily.   1  . PARoxetine (PAXIL) 40 MG tablet TAKE 1 TABLET BY MOUTH DAILY  3  . prochlorperazine (COMPAZINE) 10 MG tablet Take 1 tablet (10 mg total) by mouth every 6 (six)  hours as needed for nausea or vomiting. 30 tablet 0  . senna (SENOKOT) 8.6 MG tablet Take 1 tablet by mouth daily as needed for constipation. Reported on 12/15/2015    . timolol (TIMOPTIC) 0.5 % ophthalmic solution Place 1 drop into both eyes 3 (three) times daily. Reported on 08/30/2015  3   No current facility-administered medications for this visit.     SURGICAL HISTORY:  Past Surgical History:  Procedure  Laterality Date  . CHEST TUBE INSERTION Right 03/01/2015   Procedure: INSERTION PLEURAL DRAINAGE CATHETER RIGHT CHEST;  Surgeon: [name], MD;  Location: [facility];  Service: Thoracic;  Laterality: Right;  . COLON SURGERY    . diverticular stricture removed     1992  . fatty tumor excision     in chest  . PROSTATECTOMY     2004- Dr Risa Grill  . REMOVAL OF PLEURAL DRAINAGE CATHETER Right 04/26/2015   Procedure: REMOVAL OF PLEURAL DRAINAGE CATHETER;  Surgeon: [name], MD;  Location: [facility];  Service: Thoracic;  Laterality: Right;  . TALC PLEURODESIS Right 04/26/2015   Procedure: Pietro Cassis;  Surgeon: [name], MD;  Location: [facility];  Service: Thoracic;  Laterality: Right;  . TONSILLECTOMY      REVIEW OF SYSTEMS:  Constitutional: positive for fatigue and weight loss Eyes: negative Ears, nose, mouth, throat, and face: negative Respiratory: negative Cardiovascular: negative Gastrointestinal: negative Genitourinary:negative Integument/breast: negative Hematologic/lymphatic: negative Musculoskeletal:positive for back pain Neurological: negative Behavioral/Psych: negative Endocrine: negative Allergic/Immunologic: negative   PHYSICAL EXAMINATION: General appearance: alert, cooperative, fatigued and no distress Head: Normocephalic, without obvious abnormality, atraumatic Neck: no adenopathy, no JVD, supple, symmetrical, trachea midline and thyroid not enlarged, symmetric, no tenderness/mass/nodules Lymph nodes: Cervical, supraclavicular, and axillary nodes normal. Resp: clear to auscultation bilaterally Back: symmetric, no curvature. ROM normal. No CVA tenderness. Cardio: regular rate and rhythm, S1, S2 normal, no murmur, click, rub or gallop GI: soft, non-tender; bowel sounds normal; no masses,  no organomegaly Extremities: extremities normal, atraumatic, no cyanosis or edema Neurologic: Alert and oriented X 3, normal strength and tone. Normal  symmetric reflexes. Normal coordination and gait  ECOG PERFORMANCE STATUS: 1 - Symptomatic but completely ambulatory  Blood pressure 116/75, pulse 96, temperature 98.6 F (37 C), temperature source Oral, resp. rate 18, height _0  (1.727 m), weight 168 lb 8 oz (76.4 kg), SpO2 96 %.  LABORATORY DATA: Lab Results  Component Value Date   WBC 4.7 01/16/2016   HGB 10.9 (L) 01/16/2016   HCT 34.7 (L) 01/16/2016   MCV 87.3 01/16/2016   PLT 340 01/16/2016      Chemistry      Component Value Date/Time   NA 139 01/16/2016 1538   K 3.9 01/16/2016 1538   CL 100 (L) 05/23/2015 1725   CO2 28 01/16/2016 1538   BUN 13.1 01/16/2016 1538   CREATININE 0.9 01/16/2016 1538      Component Value Date/Time   CALCIUM 9.9 01/16/2016 1538   ALKPHOS 100 01/16/2016 1538   AST 8 01/16/2016 1538   ALT <9 01/16/2016 1538   BILITOT 0.36 01/16/2016 1538       RADIOGRAPHIC STUDIES: Ct Chest W Contrast  Result Date: 01/19/2016 CLINICAL DATA:  Right lung cancer diagnosed September 2016, last chemotherapy 4 weeks ago. Remote history prostate cancer with prior prostatectomy. EXAM: CT CHEST, ABDOMEN, AND PELVIS WITH CONTRAST TECHNIQUE: Multidetector CT imaging of the chest, abdomen and pelvis was performed following the standard protocol during bolus administration of intravenous contrast.  CONTRAST:  130m ISOVUE-300 IOPAMIDOL (ISOVUE-300) INJECTION 61% COMPARISON:  Multiple exams, including 12/01/2015 FINDINGS: CT CHEST FINDINGS Cardiovascular: Atherosclerotic calcification of the aortic arch. Mild calcification of the aortic and mitral valve leaflets. Mediastinum/Nodes: Lower paraesophageal lymph node near the hiatus 1.2 cm in short axis on image 47/2, stable. Lymph node anterior to the right mainstem bronchus 1.0 cm in short axis, previously 1.3 cm. Right anterior juxta diaphragmatic lymph node 1.1 cm in short axis on image 47/2, previously 1.2 cm. Lungs/Pleura: Stable small right pleural effusion, exudative and  with some linear calcifications along the inferior pleural margin. Right upper lobe mass 5.1 by 5.4 cm by my measurement, previously 6.0 by 5.9 cm by my measurement. This abuts the right side of the mediastinum. Associated volume loss noted. This partially tracks along an accessory fissure in the right upper lobe. Subpleural nodule or lymph node in the right upper lobe adjacent to the minor fissure, 0.5 by 0.6 cm, by my measurement previously 0.4 by 0.6 cm. Stable 4 mm right lower lobe nodule on image 87/4. Small calcified 2 by 3 mm nodule in the superior segment right lower lobe on image 55/4. 7 mm left lower lobe pulmonary nodule on image 100/4, stable. 6 by 5 mm left lower lobe pulmonary nodule on image 82/4, stable. Musculoskeletal: Faint and ill-defined patchy regions of sclerosis are present in the ribs suspicious for osseous metastatic disease. Other sclerotic lesions include the C7, T11, T10, and T12 vertebra. There is a destructive metastatic lesion involving the left T7 pedicle, facets, and lamina, with associated epidural tumor eccentric to the left, tumor approximately 2.4 by 2.1 by 2.4 cm. This tumor is likely invading the left T7-8 neural foramen, and is enlarging. CT ABDOMEN PELVIS FINDINGS Hepatobiliary: Mildly heterogeneous parenchymal enhancement in the liver without well-defined focal mass. Some of this is probably related to the relatively early phase of initial scanning. Gallbladder unremarkable. Pancreas: Unremarkable Spleen: Minimal splenic heterogeneity, likely from the early phase of contrast. Adrenals/Urinary Tract: Left adrenal mass, 2.0 by 2.6 cm, by my measurements this is stable. Small stable right adrenal mass. The kidneys appear unremarkable. Stomach/Bowel: Periampullary duodenal diverticulum. Rectosigmoid colon anastomosis patent. Vascular/Lymphatic: Lymph node interposed between the celiac trunk and the IVC measures 1.6 cm in short axis, formerly 1.1 cm. Aortocaval node on image 65/  2 measures 1.5 cm in short axis, formerly 1.3 cm. Scattered small mesenteric lymph nodes. Aortoiliac atherosclerotic vascular disease. Fusiform infrarenal abdominal aortic aneurysm 3.3 cm anterior - posterior by 3.0 cm transverse, image 74/2. Reproductive: Prostatectomy. Other: No supplemental non-categorized findings. Musculoskeletal: Scattered primarily sclerotic metastatic lesions. Notable sclerosis with a slightly permeative appearance in the left iliac bone. A left iliac metastatic lesion measures 2 cm in long axis on image 94/2, previously 1.2 cm. Small metastatic lesion along the anterior margin of the spinous process of L4 without obvious dural metastatic disease at this location. Degenerative retrolisthesis at L1- 2 and L2- 3 with anterolisthesis at L4-5. IMPRESSION: 1. Mixed appearance, with the dominant right upper lobe mass smaller, the but with progression of both sclerotic and lytic components of osseous metastatic disease and mild progression of the retroperitoneal adenopathy. 2. The most notable of the osseous metastatic disease is a destructive mass of the left T7 pedicle, facets, and lamina, with extension of epidural tumor and extension of tumor in the left T7-8 neural foramen. This could be further characterized by thoracic spine MRI with and without contrast, if clinically warranted. 3. Other imaging findings of potential clinical  significance: Atherosclerotic calcification of the aortic arch. Calcification of the aortic and mitral valve leaflets. Small right pleural effusion, stable. Several pulmonary nodules in the lungs are stable. Bilateral adrenal masses, stable. Small infrarenal abdominal aortic aneurysm. Electronically Signed   By: Van Clines M.D.   On: 01/19/2016 17:27   Ct Abdomen Pelvis W Contrast  Result Date: 01/19/2016 CLINICAL DATA:  Right lung cancer diagnosed September 2016, last chemotherapy 4 weeks ago. Remote history prostate cancer with prior prostatectomy. EXAM:  CT CHEST, ABDOMEN, AND PELVIS WITH CONTRAST TECHNIQUE: Multidetector CT imaging of the chest, abdomen and pelvis was performed following the standard protocol during bolus administration of intravenous contrast. CONTRAST:  186m ISOVUE-300 IOPAMIDOL (ISOVUE-300) INJECTION 61% COMPARISON:  Multiple exams, including 12/01/2015 FINDINGS: CT CHEST FINDINGS Cardiovascular: Atherosclerotic calcification of the aortic arch. Mild calcification of the aortic and mitral valve leaflets. Mediastinum/Nodes: Lower paraesophageal lymph node near the hiatus 1.2 cm in short axis on image 47/2, stable. Lymph node anterior to the right mainstem bronchus 1.0 cm in short axis, previously 1.3 cm. Right anterior juxta diaphragmatic lymph node 1.1 cm in short axis on image 47/2, previously 1.2 cm. Lungs/Pleura: Stable small right pleural effusion, exudative and with some linear calcifications along the inferior pleural margin. Right upper lobe mass 5.1 by 5.4 cm by my measurement, previously 6.0 by 5.9 cm by my measurement. This abuts the right side of the mediastinum. Associated volume loss noted. This partially tracks along an accessory fissure in the right upper lobe. Subpleural nodule or lymph node in the right upper lobe adjacent to the minor fissure, 0.5 by 0.6 cm, by my measurement previously 0.4 by 0.6 cm. Stable 4 mm right lower lobe nodule on image 87/4. Small calcified 2 by 3 mm nodule in the superior segment right lower lobe on image 55/4. 7 mm left lower lobe pulmonary nodule on image 100/4, stable. 6 by 5 mm left lower lobe pulmonary nodule on image 82/4, stable. Musculoskeletal: Faint and ill-defined patchy regions of sclerosis are present in the ribs suspicious for osseous metastatic disease. Other sclerotic lesions include the C7, T11, T10, and T12 vertebra. There is a destructive metastatic lesion involving the left T7 pedicle, facets, and lamina, with associated epidural tumor eccentric to the left, tumor approximately  2.4 by 2.1 by 2.4 cm. This tumor is likely invading the left T7-8 neural foramen, and is enlarging. CT ABDOMEN PELVIS FINDINGS Hepatobiliary: Mildly heterogeneous parenchymal enhancement in the liver without well-defined focal mass. Some of this is probably related to the relatively early phase of initial scanning. Gallbladder unremarkable. Pancreas: Unremarkable Spleen: Minimal splenic heterogeneity, likely from the early phase of contrast. Adrenals/Urinary Tract: Left adrenal mass, 2.0 by 2.6 cm, by my measurements this is stable. Small stable right adrenal mass. The kidneys appear unremarkable. Stomach/Bowel: Periampullary duodenal diverticulum. Rectosigmoid colon anastomosis patent. Vascular/Lymphatic: Lymph node interposed between the celiac trunk and the IVC measures 1.6 cm in short axis, formerly 1.1 cm. Aortocaval node on image 65/ 2 measures 1.5 cm in short axis, formerly 1.3 cm. Scattered small mesenteric lymph nodes. Aortoiliac atherosclerotic vascular disease. Fusiform infrarenal abdominal aortic aneurysm 3.3 cm anterior - posterior by 3.0 cm transverse, image 74/2. Reproductive: Prostatectomy. Other: No supplemental non-categorized findings. Musculoskeletal: Scattered primarily sclerotic metastatic lesions. Notable sclerosis with a slightly permeative appearance in the left iliac bone. A left iliac metastatic lesion measures 2 cm in long axis on image 94/2, previously 1.2 cm. Small metastatic lesion along the anterior margin of the spinous process of  L4 without obvious dural metastatic disease at this location. Degenerative retrolisthesis at L1- 2 and L2- 3 with anterolisthesis at L4-5. IMPRESSION: 1. Mixed appearance, with the dominant right upper lobe mass smaller, the but with progression of both sclerotic and lytic components of osseous metastatic disease and mild progression of the retroperitoneal adenopathy. 2. The most notable of the osseous metastatic disease is a destructive mass of the left T7  pedicle, facets, and lamina, with extension of epidural tumor and extension of tumor in the left T7-8 neural foramen. This could be further characterized by thoracic spine MRI with and without contrast, if clinically warranted. 3. Other imaging findings of potential clinical significance: Atherosclerotic calcification of the aortic arch. Calcification of the aortic and mitral valve leaflets. Small right pleural effusion, stable. Several pulmonary nodules in the lungs are stable. Bilateral adrenal masses, stable. Small infrarenal abdominal aortic aneurysm. Electronically Signed   By: Van Clines M.D.   On: 01/19/2016 17:27   ASSESSMENT AND PLAN: This is a pleasant 82 years old white male recently diagnosed with metastatic non-small cell lung cancer, adenocarcinoma with negative EGFR mutation and negative ALK gene translocation.  He is currently undergoing systemic chemotherapy with carboplatin and Alimta status post 5 cycles.  He tolerated this treatment well except for the visual disturbance and increased lacrimation. The recent CT scan of the chest, abdomen and pelvis showed mixed response with increase in the size of the right upper lobe lung mass but decrease in the size of the malignant right hilar lymph node. The patient is currently undergoing second line treatment with immunotherapy with Nivolumab status post 8 cycles. He tolerated the last cycle of his treatment fairly well with no significant adverse effects. Unfortunately the recent CT scan of the chest, abdomen and pelvis showed evidence for disease progression with increase in the size of the right upper lobe lung mass as well as the right hilar adenopathy. I discussed the scan results and showed the images to the patient today. He underwent palliative radiotherapy to the progressive lesion in the lung in addition to the T11 bone metastasis under the care of Dr. Tammi Klippel completed 12/26/2015. The recent CT scan of the chest, abdomen and  pelvis showed improvement of the right upper lobe lung mass but there was progression of the metastatic skeletal disease especially T7 lesion with questionable epidural extension of the tumor. I discussed the scan results and showed the images to the patient today. I recommended for him to have MRI of the thoracic spine to rule out epidural extension. The patient is scheduled to see Dr. Tammi Klippel tomorrow for evaluation and he may consider him for palliative radiotherapy to this area if not previously treated. I also discussed with the patient the role of systemic chemotherapy versus palliative care. He is interested in resuming treatment again and I recommended for him a regimen of Alimta 500 MG/M2 every 3 weeks. I discussed with the patient adverse effects of this treatment including but not limited to alopecia, myelosuppression, nausea and vomiting, peripheral neuropathy, liver or renal dysfunction. We will arrange for the patient to receive vitamin B 12 injection today. The patient would also receive prescription for Decadron 4 mg by mouth twice a day, the day before, day of and day after the chemotherapy in addition to folic acid 1 mg by mouth daily. She is expected to start the first dose of his treatment on 01/30/2016. The patient would come back for follow-up visit in 4 weeks for evaluation before starting cycle #  2. I had a lengthy discussion with the patient about his current condition and further treatment options. He is worried about not receiving any systemic chemotherapy at this point. I explained to the patient that the role of systemic chemotherapy is in nature and I would consider him for treatment if the upcoming scan showed any evidence for disease progression. For pain management she will continue with his current pain medication with tramadol and Tylenol for now. He agreed to the current plan. He was advised to call immediately if he has any concerning symptoms in the interval. The  patient voices understanding of current disease status and treatment options and is in agreement with the current care plan.  All questions were answered. The patient knows to call the clinic with any problems, questions or concerns. We can certainly see the patient much sooner if necessary.  Disclaimer: This note was dictated with voice recognition software. Similar sounding words can inadvertently be transcribed and may not be corrected upon review.

## 2016-01-22 NOTE — Telephone Encounter (Signed)
-----   Message from Ardeen Garland, RN sent at 01/22/2016  2:06 PM EDT ----- Regarding: FW: appt chagne request I have not called pt about this ----- Message ----- From: Curt Bears, MD Sent: 01/22/2016   8:27 AM To: Ardeen Garland, RN Subject: RE: appt chagne request                        I am out of town Tuesday afternoon. He can come later today. ----- Message ----- From: Ardeen Garland, RN Sent: 01/19/2016   2:36 PM To: Curt Bears, MD Subject: appt chagne request                            Asked if he can change appt on Tuesday from 0900 to after 2 .

## 2016-01-22 NOTE — Telephone Encounter (Signed)
Called pt lmov at home #, called cell # wife answered.  Discussed pt appt being r/s for today at 3pm. Wife spoke with pt and agreed he will be here at 3pm. Message to scheduling.

## 2016-01-23 ENCOUNTER — Ambulatory Visit
Admission: RE | Admit: 2016-01-23 | Discharge: 2016-01-23 | Disposition: A | Discharge status: Home / Self Care | Payer: Medicare Other | Source: Ambulatory Visit | Attending: Radiation Oncology | Admitting: Radiation Oncology

## 2016-01-23 ENCOUNTER — Encounter: Payer: Self-pay | Admitting: Radiation Oncology

## 2016-01-23 ENCOUNTER — Ambulatory Visit: Payer: Medicare Other | Admitting: Internal Medicine

## 2016-01-23 ENCOUNTER — Telehealth: Payer: Self-pay | Admitting: Internal Medicine

## 2016-01-23 VITALS — BP 145/98 | HR 102 | Temp 97.9°F | Resp 18 | Ht 68.0 in | Wt 170.0 lb

## 2016-01-23 DIAGNOSIS — C3411 Malignant neoplasm of upper lobe, right bronchus or lung: Secondary | ICD-10-CM | POA: Diagnosis present

## 2016-01-23 DIAGNOSIS — C7951 Secondary malignant neoplasm of bone: Secondary | ICD-10-CM | POA: Insufficient documentation

## 2016-01-23 NOTE — Progress Notes (Signed)
   Weight changes, if any:  Wt Readings from Last 3 Encounters:  01/23/16 170 lb (77.1 kg)  01/22/16 168 lb 8 oz (76.4 kg)  01/02/16 168 lb (76.2 kg)   Respiratory complaints, if any: Denies SOB,coughing Hemoptysis, if any:  None Swallowing Problems/Pain/Difficulty swallowing:None Smoking Tobacco/Marijuana/Snuff/ETOH RDE:YCXK 06-10-1979 1 p/d cigarettes no alcohol or drugs Skin:Normal color Pain : None Appetite:Good Fatigue:Fatigue since taking chemotherapy When is next chemo scheduled?:systemic chemotherapy with carboplatin and Alimta status post 5 cycles next week Lab work from of chart:01-22-16 CBC,Cmet  01-19-16 CT chest w contrast BP (!) 145/98 (BP Location: Right Arm, Patient Position: Standing, Cuff Size: Normal)   Pulse (!) 102   Temp 97.9 F (36.6 C) (Oral)   Resp 18   Ht '5\' 8"'$  (1.727 m)   Wt 170 lb (77.1 kg)   SpO2 97%   BMI 25.85 kg/m

## 2016-01-23 NOTE — Telephone Encounter (Signed)
Patient stopped by today to get appointments requested at yesterday's visit. Appointments scheduled per 8/14 LOS but pushed back by one week. Per patient he would like to get out of the fatigue he is in before starting. Check with desk nurse and message sent to MM.

## 2016-01-24 ENCOUNTER — Other Ambulatory Visit: Payer: Self-pay | Admitting: Internal Medicine

## 2016-01-25 NOTE — Progress Notes (Signed)
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 He has noticed improvement in his back pain since completing radiation and reports it is nearly resolved. No other complaints are noted. He had a CT scan on 01/19/16 which compared his tumor at 5.1 x 5.4 cm previously 6 x 5.9 cm. He has a progression of an iliac wing lesion, and T7-8 tumor measures 2.4 x 2.1 x 2.4 cm an is concerning for invasion of the neural foramen and is larger than in May 2017.               ALLERGIES:  is allergic to codeine; imuran [azathioprine]; remicade [infliximab]; simvastatin; and sulfa antibiotics.  Meds: Current Outpatient Prescriptions  Medication Sig Dispense Refill  . acetaminophen (TYLENOL) 500 MG tablet Take 1,000 mg by mouth every 4 (four) hours as needed for mild pain, moderate pain, fever or headache.    . APRISO 0.375 G 24 hr capsule Take 8 capsules by mouth daily.   11  . atorvastatin (LIPITOR) 20 MG tablet Take 20 mg by mouth daily.   1  . dorzolamide (TRUSOPT) 2 % ophthalmic solution Place 1 drop into both eyes 3 (three) times daily.  3  . levothyroxine (SYNTHROID, LEVOTHROID) 25 MCG tablet Take 25 mcg by mouth daily.   1  . PARoxetine (PAXIL) 40 MG tablet TAKE 1 TABLET BY MOUTH DAILY  3  . senna (SENOKOT) 8.6 MG tablet Take 1 tablet by mouth daily as needed for constipation. Reported on 12/15/2015    . timolol (TIMOPTIC) 0.5 % ophthalmic solution Place 1 drop into both eyes 3 (three) times daily. Reported on 08/30/2015  3  .  dexamethasone (DECADRON) 4 MG tablet 4 mg by mouth twice a day the day before, day of and day after the chemotherapy every 3 weeks (Patient not taking: Reported on 01/23/2016) 40 tablet 1  . folic acid (FOLVITE) 1 MG tablet Take 1 tablet (1 mg total) by mouth daily. (Patient not taking: Reported on 01/23/2016) 30 tablet 4  . gabapentin (NEURONTIN) 300 MG capsule Take 1 capsule (300 mg total) by mouth at bedtime. (Patient not taking: Reported on 01/23/2016) 90 capsule 0  . prochlorperazine (COMPAZINE) 10 MG tablet Take 1 tablet (10 mg total) by mouth every 6 (six) hours as needed for nausea or vomiting. (Patient not taking: Reported on 01/23/2016) 30 tablet 0   No current facility-administered medications for this encounter.     Physical Findings:  height is '5\' 8"'$  (1.727 m) and weight is 170 lb (77.1 kg). His oral temperature is 97.9 F (36.6 C). His blood pressure is 145/98 (abnormal) and his pulse is 102 (abnormal). His respiration is 18 and oxygen saturation is 97%.  In general this is an elderly but otherwise well appearing caucasian male in no acute distress. He's alert and oriented x4 and appropriate throughout the examination. Cardiopulmonary assessment is negative for acute distress and he exhibits normal effort. His skin is intact since completing treatment.  Lab Findings: Lab Results  Component Value Date   WBC 4.7 01/16/2016   HGB 10.9 (L) 01/16/2016   HCT 34.7 (L) 01/16/2016   MCV 87.3 01/16/2016   PLT 340 01/16/2016     Radiographic Findings: Ct Chest W Contrast  Result Date: 01/19/2016 CLINICAL DATA:  Right lung cancer diagnosed September 2016, last chemotherapy 4 weeks ago. Remote history prostate cancer with prior prostatectomy. EXAM: CT CHEST, ABDOMEN, AND PELVIS WITH CONTRAST TECHNIQUE: Multidetector CT imaging of the chest, abdomen and pelvis was performed following the standard protocol during bolus administration of intravenous contrast. CONTRAST:  172m ISOVUE-300  IOPAMIDOL (ISOVUE-300) INJECTION 61% COMPARISON:  Multiple exams, including 12/01/2015 FINDINGS: CT CHEST FINDINGS Cardiovascular: Atherosclerotic calcification of the aortic arch. Mild calcification of the aortic and mitral valve leaflets. Mediastinum/Nodes: Lower paraesophageal lymph node near the hiatus 1.2 cm in short axis on image 47/2, stable. Lymph node anterior to the right mainstem bronchus 1.0 cm in short axis, previously 1.3 cm. Right anterior juxta diaphragmatic lymph node 1.1 cm in short axis on image 47/2, previously 1.2 cm. Lungs/Pleura: Stable small right pleural effusion, exudative and with some linear calcifications along the inferior pleural margin. Right upper lobe mass 5.1 by 5.4 cm by my measurement, previously 6.0 by 5.9 cm by my measurement. This abuts the right side of the mediastinum. Associated volume loss noted. This partially tracks along an accessory fissure in the right upper lobe. Subpleural nodule or lymph node in the right upper lobe adjacent to the minor fissure, 0.5 by 0.6 cm, by my measurement previously 0.4 by 0.6 cm. Stable 4 mm right lower lobe nodule on image 87/4. Small calcified 2 by 3 mm nodule in the superior segment right lower lobe on image 55/4. 7 mm left lower lobe pulmonary nodule on image 100/4, stable. 6 by 5 mm left lower lobe pulmonary nodule on image 82/4, stable. Musculoskeletal: Faint and ill-defined patchy regions of sclerosis are present in the ribs suspicious for osseous metastatic disease. Other sclerotic lesions include the C7, T11, T10, and T12 vertebra. There is a destructive metastatic lesion involving the left T7 pedicle, facets, and lamina, with associated epidural tumor eccentric to the left, tumor approximately 2.4 by 2.1 by 2.4 cm. This tumor is likely invading the left T7-8 neural foramen, and is enlarging. CT ABDOMEN PELVIS FINDINGS Hepatobiliary: Mildly heterogeneous parenchymal enhancement in the liver without well-defined focal mass. Some of  this is probably related to the relatively early phase of initial scanning. Gallbladder unremarkable. Pancreas: Unremarkable Spleen: Minimal splenic heterogeneity, likely from the early phase of contrast. Adrenals/Urinary Tract: Left adrenal mass, 2.0 by 2.6 cm, by my measurements this is stable. Small stable right adrenal mass. The kidneys appear unremarkable. Stomach/Bowel: Periampullary duodenal diverticulum. Rectosigmoid colon anastomosis patent. Vascular/Lymphatic: Lymph node interposed between the celiac trunk and the IVC measures 1.6 cm in short axis, formerly 1.1 cm. Aortocaval node on image 65/ 2 measures 1.5 cm in short axis, formerly 1.3 cm. Scattered small mesenteric lymph nodes. Aortoiliac atherosclerotic vascular disease. Fusiform infrarenal abdominal aortic aneurysm 3.3 cm anterior - posterior by 3.0 cm transverse, image 74/2. Reproductive: Prostatectomy. Other: No supplemental non-categorized findings. Musculoskeletal: Scattered primarily sclerotic metastatic lesions. Notable sclerosis with a slightly permeative appearance in the left iliac bone. A left iliac metastatic lesion measures 2 cm in long axis on image 94/2, previously 1.2 cm. Small metastatic lesion along the anterior margin of the spinous process of L4 without obvious dural metastatic disease at this location. Degenerative retrolisthesis at L1-  2 and L2- 3 with anterolisthesis at L4-5. IMPRESSION: 1. Mixed appearance, with the dominant right upper lobe mass smaller, the but with progression of both sclerotic and lytic components of osseous metastatic disease and mild progression of the retroperitoneal adenopathy. 2. The most notable of the osseous metastatic disease is a destructive mass of the left T7 pedicle, facets, and lamina, with extension of epidural tumor and extension of tumor in the left T7-8 neural foramen. This could be further characterized by thoracic spine MRI with and without contrast, if clinically warranted. 3. Other  imaging findings of potential clinical significance: Atherosclerotic calcification of the aortic arch. Calcification of the aortic and mitral valve leaflets. Small right pleural effusion, stable. Several pulmonary nodules in the lungs are stable. Bilateral adrenal masses, stable. Small infrarenal abdominal aortic aneurysm. Electronically Signed   By: Van Clines M.D.   On: 01/19/2016 17:27   Ct Abdomen Pelvis W Contrast  Result Date: 01/19/2016 CLINICAL DATA:  Right lung cancer diagnosed September 2016, last chemotherapy 4 weeks ago. Remote history prostate cancer with prior prostatectomy. EXAM: CT CHEST, ABDOMEN, AND PELVIS WITH CONTRAST TECHNIQUE: Multidetector CT imaging of the chest, abdomen and pelvis was performed following the standard protocol during bolus administration of intravenous contrast. CONTRAST:  129m ISOVUE-300 IOPAMIDOL (ISOVUE-300) INJECTION 61% COMPARISON:  Multiple exams, including 12/01/2015 FINDINGS: CT CHEST FINDINGS Cardiovascular: Atherosclerotic calcification of the aortic arch. Mild calcification of the aortic and mitral valve leaflets. Mediastinum/Nodes: Lower paraesophageal lymph node near the hiatus 1.2 cm in short axis on image 47/2, stable. Lymph node anterior to the right mainstem bronchus 1.0 cm in short axis, previously 1.3 cm. Right anterior juxta diaphragmatic lymph node 1.1 cm in short axis on image 47/2, previously 1.2 cm. Lungs/Pleura: Stable small right pleural effusion, exudative and with some linear calcifications along the inferior pleural margin. Right upper lobe mass 5.1 by 5.4 cm by my measurement, previously 6.0 by 5.9 cm by my measurement. This abuts the right side of the mediastinum. Associated volume loss noted. This partially tracks along an accessory fissure in the right upper lobe. Subpleural nodule or lymph node in the right upper lobe adjacent to the minor fissure, 0.5 by 0.6 cm, by my measurement previously 0.4 by 0.6 cm. Stable 4 mm right lower  lobe nodule on image 87/4. Small calcified 2 by 3 mm nodule in the superior segment right lower lobe on image 55/4. 7 mm left lower lobe pulmonary nodule on image 100/4, stable. 6 by 5 mm left lower lobe pulmonary nodule on image 82/4, stable. Musculoskeletal: Faint and ill-defined patchy regions of sclerosis are present in the ribs suspicious for osseous metastatic disease. Other sclerotic lesions include the C7, T11, T10, and T12 vertebra. There is a destructive metastatic lesion involving the left T7 pedicle, facets, and lamina, with associated epidural tumor eccentric to the left, tumor approximately 2.4 by 2.1 by 2.4 cm. This tumor is likely invading the left T7-8 neural foramen, and is enlarging. CT ABDOMEN PELVIS FINDINGS Hepatobiliary: Mildly heterogeneous parenchymal enhancement in the liver without well-defined focal mass. Some of this is probably related to the relatively early phase of initial scanning. Gallbladder unremarkable. Pancreas: Unremarkable Spleen: Minimal splenic heterogeneity, likely from the early phase of contrast. Adrenals/Urinary Tract: Left adrenal mass, 2.0 by 2.6 cm, by my measurements this is stable. Small stable right adrenal mass. The kidneys appear unremarkable. Stomach/Bowel: Periampullary duodenal diverticulum. Rectosigmoid colon anastomosis patent. Vascular/Lymphatic: Lymph node interposed between the celiac trunk and the IVC measures 1.6 cm  in short axis, formerly 1.1 cm. Aortocaval node on image 65/ 2 measures 1.5 cm in short axis, formerly 1.3 cm. Scattered small mesenteric lymph nodes. Aortoiliac atherosclerotic vascular disease. Fusiform infrarenal abdominal aortic aneurysm 3.3 cm anterior - posterior by 3.0 cm transverse, image 74/2. Reproductive: Prostatectomy. Other: No supplemental non-categorized findings. Musculoskeletal: Scattered primarily sclerotic metastatic lesions. Notable sclerosis with a slightly permeative appearance in the left iliac bone. A left iliac  metastatic lesion measures 2 cm in long axis on image 94/2, previously 1.2 cm. Small metastatic lesion along the anterior margin of the spinous process of L4 without obvious dural metastatic disease at this location. Degenerative retrolisthesis at L1- 2 and L2- 3 with anterolisthesis at L4-5. IMPRESSION: 1. Mixed appearance, with the dominant right upper lobe mass smaller, the but with progression of both sclerotic and lytic components of osseous metastatic disease and mild progression of the retroperitoneal adenopathy. 2. The most notable of the osseous metastatic disease is a destructive mass of the left T7 pedicle, facets, and lamina, with extension of epidural tumor and extension of tumor in the left T7-8 neural foramen. This could be further characterized by thoracic spine MRI with and without contrast, if clinically warranted. 3. Other imaging findings of potential clinical significance: Atherosclerotic calcification of the aortic arch. Calcification of the aortic and mitral valve leaflets. Small right pleural effusion, stable. Several pulmonary nodules in the lungs are stable. Bilateral adrenal masses, stable. Small infrarenal abdominal aortic aneurysm. Electronically Signed   By: Van Clines M.D.   On: 01/19/2016 17:27    Impression/Plan: 1. 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 The patient is improving with symptoms since radiation. He had a recent CT to assess his status since completing radiation and this revealed concerns with possible encroachment of his spinal nerves. He continues dexamethasone and is going to have an MRI which we will follow up on. I will also discuss his imaging with Dr. Tammi Klippel. The patient states agreement and understanding.    Carola Rhine, PAC

## 2016-01-29 ENCOUNTER — Ambulatory Visit: Payer: Medicare Other | Admitting: Internal Medicine

## 2016-01-31 ENCOUNTER — Ambulatory Visit (HOSPITAL_COMMUNITY)
Admission: RE | Admit: 2016-01-31 | Discharge: 2016-01-31 | Disposition: A | Discharge status: Home / Self Care | Payer: Medicare Other | Source: Ambulatory Visit | Attending: Internal Medicine | Admitting: Internal Medicine

## 2016-01-31 DIAGNOSIS — R937 Abnormal findings on diagnostic imaging of other parts of musculoskeletal system: Secondary | ICD-10-CM | POA: Diagnosis not present

## 2016-01-31 DIAGNOSIS — Z9221 Personal history of antineoplastic chemotherapy: Secondary | ICD-10-CM | POA: Diagnosis not present

## 2016-01-31 DIAGNOSIS — C7951 Secondary malignant neoplasm of bone: Secondary | ICD-10-CM | POA: Diagnosis not present

## 2016-01-31 DIAGNOSIS — M488X4 Other specified spondylopathies, thoracic region: Secondary | ICD-10-CM | POA: Insufficient documentation

## 2016-01-31 DIAGNOSIS — C3411 Malignant neoplasm of upper lobe, right bronchus or lung: Secondary | ICD-10-CM

## 2016-01-31 DIAGNOSIS — Z5111 Encounter for antineoplastic chemotherapy: Secondary | ICD-10-CM

## 2016-01-31 MED ORDER — GADOBENATE DIMEGLUMINE 529 MG/ML IV SOLN
16.0000 mL | Freq: Once | INTRAVENOUS | Status: AC | PRN
  Administered 2016-01-31: 16 mL via INTRAVENOUS

## 2016-02-06 ENCOUNTER — Ambulatory Visit (HOSPITAL_BASED_OUTPATIENT_CLINIC_OR_DEPARTMENT_OTHER): Payer: Medicare Other

## 2016-02-06 ENCOUNTER — Encounter: Payer: Medicare Other | Admitting: Nutrition

## 2016-02-06 ENCOUNTER — Telehealth: Payer: Self-pay | Admitting: Radiation Oncology

## 2016-02-06 ENCOUNTER — Other Ambulatory Visit (HOSPITAL_BASED_OUTPATIENT_CLINIC_OR_DEPARTMENT_OTHER): Payer: Medicare Other

## 2016-02-06 VITALS — BP 129/83 | HR 68 | Temp 97.7°F | Resp 18

## 2016-02-06 DIAGNOSIS — Z5111 Encounter for antineoplastic chemotherapy: Secondary | ICD-10-CM

## 2016-02-06 DIAGNOSIS — C3411 Malignant neoplasm of upper lobe, right bronchus or lung: Secondary | ICD-10-CM | POA: Diagnosis not present

## 2016-02-06 DIAGNOSIS — C7951 Secondary malignant neoplasm of bone: Secondary | ICD-10-CM

## 2016-02-06 LAB — COMPREHENSIVE METABOLIC PANEL
ALBUMIN: 3.1 g/dL — AB (ref 3.5–5.0)
ALK PHOS: 110 U/L (ref 40–150)
ALT: <9 U/L (ref 0–55)
ANION GAP: 13 meq/L — AB (ref 3–11)
AST: 11 U/L (ref 5–34)
BUN: 18.9 mg/dL (ref 7.0–26.0)
CO2: 23 mEq/L (ref 22–29)
Calcium: 10 mg/dL (ref 8.4–10.4)
Chloride: 105 mEq/L (ref 98–109)
Creatinine: 0.8 mg/dL (ref 0.7–1.3)
EGFR: 83 mL/min/{1.73_m2} — AB (ref >90)
Glucose: 166 mg/dl — ABNORMAL HIGH (ref 70–140)
POTASSIUM: 4.3 meq/L (ref 3.5–5.1)
SODIUM: 140 meq/L (ref 136–145)
TOTAL PROTEIN: 7.7 g/dL (ref 6.4–8.3)

## 2016-02-06 LAB — CBC WITH DIFFERENTIAL/PLATELET
BASO%: 0 % (ref 0.0–2.0)
BASOS ABS: 0 10*3/uL (ref 0.0–0.1)
EOS ABS: 0 10*3/uL (ref 0.0–0.5)
EOS%: 0 % (ref 0.0–7.0)
HCT: 34.9 % — ABNORMAL LOW (ref 38.4–49.9)
HEMOGLOBIN: 11.1 g/dL — AB (ref 13.0–17.1)
LYMPH%: 7.7 % — AB (ref 14.0–49.0)
MCH: 27.3 pg (ref 27.2–33.4)
MCHC: 31.7 g/dL — ABNORMAL LOW (ref 32.0–36.0)
MCV: 86.3 fL (ref 79.3–98.0)
MONO#: 0.3 10*3/uL (ref 0.1–0.9)
MONO%: 3.2 % (ref 0.0–14.0)
NEUT%: 89.1 % — ABNORMAL HIGH (ref 39.0–75.0)
NEUTROS ABS: 8.4 10*3/uL — AB (ref 1.5–6.5)
PLATELETS: 347 10*3/uL (ref 140–400)
RBC: 4.05 10*6/uL — AB (ref 4.20–5.82)
RDW: 17.2 % — ABNORMAL HIGH (ref 11.0–14.6)
WBC: 9.4 10*3/uL (ref 4.0–10.3)
lymph#: 0.7 10*3/uL — ABNORMAL LOW (ref 0.9–3.3)

## 2016-02-06 MED ORDER — SODIUM CHLORIDE 0.9 % IV SOLN
Freq: Once | INTRAVENOUS | Status: AC
  Administered 2016-02-06: 16:00:00 via INTRAVENOUS

## 2016-02-06 MED ORDER — PROCHLORPERAZINE MALEATE 10 MG PO TABS
10.0000 mg | ORAL_TABLET | Freq: Once | ORAL | Status: AC
  Administered 2016-02-06: 10 mg via ORAL

## 2016-02-06 MED ORDER — PROCHLORPERAZINE MALEATE 10 MG PO TABS
ORAL_TABLET | ORAL | Status: AC
  Filled 2016-02-06: qty 1

## 2016-02-06 MED ORDER — SODIUM CHLORIDE 0.9 % IV SOLN
520.0000 mg/m2 | Freq: Once | INTRAVENOUS | Status: AC
  Administered 2016-02-06: 1000 mg via INTRAVENOUS
  Filled 2016-02-06: qty 40

## 2016-02-06 MED ORDER — AMOXICILLIN-POT CLAVULANATE 875-125 MG PO TABS: 1.0000 | ORAL_TABLET | Freq: Two times a day (BID) | ORAL | 0 refills | Status: AC

## 2016-02-06 NOTE — Patient Instructions (Signed)
[facility] Discharge Instructions for Patients Receiving Chemotherapy  Today you received the following chemotherapy agents Alimta.  To help prevent nausea and vomiting after your treatment, we encourage you to take your nausea medication as prescribed.   If you develop nausea and vomiting that is not controlled by your nausea medication, call the clinic.   BELOW ARE SYMPTOMS THAT SHOULD BE REPORTED IMMEDIATELY:  *FEVER GREATER THAN 100.5 F  *CHILLS WITH OR WITHOUT FEVER  NAUSEA AND VOMITING THAT IS NOT CONTROLLED WITH YOUR NAUSEA MEDICATION  *UNUSUAL SHORTNESS OF BREATH  *UNUSUAL BRUISING OR BLEEDING  TENDERNESS IN MOUTH AND THROAT WITH OR WITHOUT PRESENCE OF ULCERS  *URINARY PROBLEMS  *BOWEL PROBLEMS  UNUSUAL RASH Items with * indicate a potential emergency and should be followed up as soon as possible.  Feel free to call the clinic you have any questions or concerns. The clinic phone number is [phone].  Please show the CHEMO ALERT CARD at check-in to the Emergency Department and triage nurse.   

## 2016-02-06 NOTE — Telephone Encounter (Signed)
Left message with wife on Monday the 28th for a Thursday appointment @ 8 am, patient wife advised patient would NOT make that appointment and asked for a different time, gave her Sept 11th in the afternoon she accepted and advised they would be here

## 2016-02-08 ENCOUNTER — Ambulatory Visit: Payer: Self-pay | Admitting: Radiation Oncology

## 2016-02-09 ENCOUNTER — Telehealth: Payer: Self-pay | Admitting: *Deleted

## 2016-02-09 NOTE — Telephone Encounter (Signed)
FYI "I was started on an antibiotic and have diarrhea so I'm calling to see what I need to do.  Diarrhea started today and I've had three to four bm's."  Advised he eat yogurt, stay hydrated with Gatorade, water because the antibiotic changes the flora in the intestines.  "I know that.  Do I need to continue the antibiotic?"  Yes, continue antibiotic but we need to discuss controlling the diarrhea and keeping you hydrated.  Okay, thank you."  Call ended.

## 2016-02-10 NOTE — Telephone Encounter (Signed)
Try Probiotics and stop antibiotics if diarrhea gets worse.

## 2016-02-14 ENCOUNTER — Encounter: Payer: Self-pay | Admitting: Radiation Oncology

## 2016-02-14 ENCOUNTER — Telehealth: Payer: Self-pay | Admitting: Medical Oncology

## 2016-02-14 NOTE — Telephone Encounter (Signed)
States he is not having the dental work or chemo until " my head is cleared up, I can't function because I am in a fog of chemo. I cannot make it across the room, alls I want to do is sleep. I have taken my meds and I am not getting the dental work or chemo. I will call you back and let you know".

## 2016-02-14 NOTE — Progress Notes (Signed)
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 on 03/01/2015. 2) Systemic chemotherapy with carboplatin for AUC of 5 and Alimta 500 MG/M2. First cycle on 03/14/2015. Status post 5 cycles, discontinued secondary to disease progression.  3) Second line treatment with immunotherapy with Nivolumab 240 MG IV every 2 weeks, first dose 08/15/2015. Status post 8 cycles. Last dose was given 11/21/2015, discontinued secondary to disease progression. 4) Palliative radiotherapy to the right upper lobe lung mass in addition to the mediastinal lymphadenopathy and T11 metastatic bone lesion under the care of Dr. Tammi Klippel completed 12/26/2015.  CURRENT THERAPY: Systemic chemotherapy with Alimta 500 MG/M2 every 3 weeks. First dose 01/30/2016.   Pain on a scale of 0-10 is: Patient reported to Dr. Julien Nordmann persistent upper back pain. Patient denies any pain in his back presently even states the previous radiation area (right upper lung and T7) are pain free.   If Spine Met(s), symptoms, if any, include:  Bowel/Bladder retention or incontinence (please describe): no  Numbness or weakness in extremities (please describe): no  Current Decadron regimen, if applicable: Decadron 4 mg by mouth twice a day, the day before, day of and day after the chemotherapy. Patient reports following this last round of chemotherapy he felt horrible and "isn't sure what I will do next."   Ambulatory status? Walker? Wheelchair?: Ambulatory  SAFETY ISSUES:  Prior radiation? yes  Pacemaker/ICD? no  Possible current pregnancy? no  Is the  patient on methotrexate? no  Current Complaints / other details:  82 year old male.

## 2016-02-16 ENCOUNTER — Telehealth: Payer: Self-pay | Admitting: Medical Oncology

## 2016-02-16 NOTE — Telephone Encounter (Signed)
I called pt to check how he is doing and offered lab appt for cbc,cmet  . "I don't want to do anything until I get this poison out of my system". I told him to call back .

## 2016-02-19 ENCOUNTER — Ambulatory Visit: Payer: Medicare Other | Admitting: Radiation Oncology

## 2016-02-19 ENCOUNTER — Ambulatory Visit
Admission: RE | Admit: 2016-02-19 | Discharge: 2016-02-19 | Disposition: A | Discharge status: Home / Self Care | Payer: Medicare Other | Source: Ambulatory Visit | Attending: Radiation Oncology | Admitting: Radiation Oncology

## 2016-02-19 ENCOUNTER — Encounter: Payer: Self-pay | Admitting: Radiation Oncology

## 2016-02-19 VITALS — BP 96/70 | HR 98 | Resp 16 | Ht 68.0 in | Wt 172.3 lb

## 2016-02-19 DIAGNOSIS — C3411 Malignant neoplasm of upper lobe, right bronchus or lung: Secondary | ICD-10-CM

## 2016-02-19 DIAGNOSIS — C7951 Secondary malignant neoplasm of bone: Secondary | ICD-10-CM

## 2016-02-19 HISTORY — DX: Malignant neoplasm of bone and articular cartilage, unspecified: C41.9

## 2016-02-19 NOTE — Progress Notes (Signed)
Radiation Oncology         [phone] ________________________________  Name: Sean Cox MRN: [id number]  Date: 02/19/2016  DOB: [birth year]  Follow-Up Visit Note  CC: [name], MD  Curt Bears, MD  Diagnosis:   82 y.o. 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 Malignant neoplasm of right upper lobe of lung (HCC) 162.3 C34.11     Interval Since Last Radiation:  2 months   12/13/15 - 12/26/15: 30 Gy in 10 fractions to the right upper lung and thoracic spine (T7) - depicted below:    Narrative:  The patient returns today for follow-up. CT of the chest/abd/pelvis on 01/19/16 showed that the RUL mass has decreased in size, but there is progression of osseous metastatic disease and mild progression of retroperitoneal adenopathy. MRI of the thoracic spine on 01/31/16 showed diffuse osseous metastases; such as a left T7 pedicle mass extending into the left lateral and posterior lateral epidural space and partially effacing the left T7-8 neural foramen with no significant canal stenosis or cord compression, a T11 vertebral body lesion extending minimally posterior into the right neural foramen with mild foraminal narrowing, and prominent curvilinear enhancement of the surface of the thoracic cord from T10.  The patient returns today to discuss further radiation to his osseous metastases.  On review of systems: The patient denies any current back pain and even sates that the previous sites of radiation (the right upper lung and T7) are pain free. He reports fatigue, especially after chemotherapy. He reports sleeping most of the day. The patient takes Decadron 4 mg by mouth twice a day, the day before, day of, and day after the chemotherapy every 3 weeks.    ALLERGIES:  is allergic to codeine; imuran [azathioprine]; remicade [infliximab]; simvastatin; and sulfa antibiotics.  Meds: Current  Outpatient Prescriptions  Medication Sig Dispense Refill  . acetaminophen (TYLENOL) 500 MG tablet Take 1,000 mg by mouth every 4 (four) hours as needed for mild pain, moderate pain, fever or headache.    [name] atorvastatin (LIPITOR) 20 MG tablet Take 20 mg by mouth daily.   1  . folic acid (FOLVITE) 1 MG tablet Take 1 tablet (1 mg total) by mouth daily. 30 tablet 4  . levothyroxine (SYNTHROID, LEVOTHROID) 25 MCG tablet Take 25 mcg by mouth daily.   1  . PARoxetine (PAXIL) 40 MG tablet TAKE 1 TABLET BY MOUTH DAILY  3  . timolol (TIMOPTIC) 0.5 % ophthalmic solution Place 1 drop into both eyes 3 (three) times daily. Reported on 08/30/2015  3  . APRISO 0.375 G 24 hr capsule Take 8 capsules by mouth daily.   11  . dexamethasone (DECADRON) 4 MG tablet 4 mg by mouth twice a day the day before, day of and day after the chemotherapy every 3 weeks (Patient not taking: Reported on 01/23/2016) 40 tablet 1  . dorzolamide (TRUSOPT) 2 % ophthalmic solution Place 1 drop into both eyes 3 (three) times daily.  3  . gabapentin (NEURONTIN) 300 MG capsule Take 1 capsule (300 mg total) by mouth at bedtime. (Patient not taking: Reported on 01/23/2016) 90 capsule 0  . prochlorperazine (COMPAZINE) 10 MG tablet Take 1 tablet (10 mg total) by mouth every 6 (six) hours as needed for nausea or vomiting. (Patient not taking: Reported on 01/23/2016) 30 tablet 0  . senna (SENOKOT) 8.6 MG tablet Take 1 tablet by  mouth daily as needed for constipation. Reported on 12/15/2015     No current facility-administered medications for this encounter.     Physical Findings: The patient is in no acute distress. Patient is alert and oriented.  height is '5\' 8"'$  (1.727 m) and weight is 172 lb 4.8 oz (78.2 kg). His blood pressure is 96/70 and his pulse is 98. His respiration is 16 and oxygen saturation is 100%.   Normal respiratory effort. Musculoskeletal exam reveals no areas of tender posterior spinous processes.  Lab Findings: Lab Results    Component Value Date   WBC 9.4 02/06/2016   WBC 8.2 03/01/2015   HGB 11.1 (L) 02/06/2016   HCT 34.9 (L) 02/06/2016   PLT 347 02/06/2016    Lab Results  Component Value Date   NA 140 02/06/2016   K 4.3 02/06/2016   CHLORIDE 105 02/06/2016   CO2 23 02/06/2016   GLUCOSE 166 (H) 02/06/2016   BUN 18.9 02/06/2016   CREATININE 0.8 02/06/2016   BILITOT <0.30 02/06/2016   ALKPHOS 110 02/06/2016   AST 11 02/06/2016   ALT <9 02/06/2016   PROT 7.7 02/06/2016   ALBUMIN 3.1 (L) 02/06/2016   CALCIUM 10.0 02/06/2016   ANIONGAP 13 (H) 02/06/2016   ANIONGAP 9 05/23/2015    Radiographic Findings: Mr Thoracic Spine W Wo Contrast  Result Date: 01/31/2016 CLINICAL DATA:  82 y/o M; metastatic lung cancer to the spine with prominent destructive lesion of the left T7 pedicle. EXAM: MRI THORACIC WITHOUT AND WITH CONTRAST TECHNIQUE: Multiplanar and multiecho pulse sequences of the thoracic spine were obtained without and with intravenous contrast. CONTRAST:  40m MULTIHANCE GADOBENATE DIMEGLUMINE 529 MG/ML IV SOLN COMPARISON:  Chest CT dated 01/19/2016 FINDINGS: MRI THORACIC SPINE FINDINGS Alignment:  Physiologic. Vertebrae: There are numerous metastasis diffusely throughout the visualized thoracic spine the vertebral bodies and posterior elements with low T1 and high T2 signal and mild enhancement most pronounced within the T5, T7, T10, and T11 vertebral bodies. No loss of vertebral body height. The lesion within the left T7 pedicle, transverse process, and articular facets at the extends into the left lateral and posterior lateral epidural space and partially effaces the left T7-T8 neural foramen which is moderately narrowed. There is no significant canal stenosis at that level. No significant epidural disease at other levels. The lesion within the T11 vertebral body has a minimal posterior extent into the right foraminal zone resulting and moderate right foraminal narrowing. Cord: Prominent curvilinear  enhancement of the surface of the thoracic cord from T10 to conus which appears to be vascular on the axial postcontrast sequence. No cord compression. Otherwise no abnormal cord signal. Paraspinal and other soft tissues: Paraspinal muscles are unremarkable. Small right pleural effusion. Lung parenchyma is better assessed on recent chest CT. IMPRESSION: 1. Diffuse osseous metastasis. 2. Left T7 pedicle mass extends into the left lateral and posterior lateral epidural space and partially effaces the left T7-8 neural foramen. No significant canal stenosis. No cord compression. 3. T11 vertebral body lesion extends minimally posterior into the right neural foramen with mild foraminal narrowing. 4. Prominent curvilinear enhancement of the surface of the thoracic cord from T10 the conus appears vascular on the axial series. 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 Consider correlation with lumbar puncture. Electronically Signed   By: LKristine GarbeM.D.   On: 01/31/2016 22:38    Impression:  82y.o. 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  The  patient is currently without significant back pain from sites of metastatic involvement.  Plan: The patient will continue to follow with medical oncology. I would be happy to see him back if he ever needs palliative radiation to T7, T11, or other sites.   _____________________________________  Sheral Apley Tammi Klippel, M.D.  This document serves as a record of services personally performed by Tyler Pita, MD. It was created on his behalf by Darcus Austin, a trained medical scribe. The creation of this record is based on the scribe's personal observations and the provider's statements to them. This document has been checked and approved by the attending provider.

## 2016-02-19 NOTE — Progress Notes (Signed)
See progress note under physician encounter. 

## 2016-02-19 NOTE — Addendum Note (Signed)
Encounter addended by: Heywood Footman, RN on: 02/19/2016  3:22 PM<BR>    Actions taken: Charge Capture section accepted

## 2016-02-19 NOTE — Progress Notes (Signed)
Physician requested RN not charge patient for this visit.

## 2016-02-19 NOTE — Addendum Note (Signed)
Encounter addended by: Heywood Footman, RN on: 02/19/2016  3:23 PM<BR>    Actions taken: Sign clinical note

## 2016-02-21 ENCOUNTER — Telehealth: Payer: Self-pay | Admitting: Medical Oncology

## 2016-02-21 NOTE — Telephone Encounter (Signed)
Confirmed appts for next week.

## 2016-02-27 ENCOUNTER — Ambulatory Visit (HOSPITAL_BASED_OUTPATIENT_CLINIC_OR_DEPARTMENT_OTHER): Payer: Medicare Other | Admitting: Internal Medicine

## 2016-02-27 ENCOUNTER — Ambulatory Visit: Payer: Medicare Other

## 2016-02-27 ENCOUNTER — Encounter: Payer: Self-pay | Admitting: Internal Medicine

## 2016-02-27 ENCOUNTER — Other Ambulatory Visit (HOSPITAL_BASED_OUTPATIENT_CLINIC_OR_DEPARTMENT_OTHER): Payer: Medicare Other

## 2016-02-27 VITALS — BP 125/81 | HR 82 | Temp 97.9°F | Resp 18 | Ht 68.0 in | Wt 171.0 lb

## 2016-02-27 DIAGNOSIS — C7951 Secondary malignant neoplasm of bone: Secondary | ICD-10-CM | POA: Diagnosis not present

## 2016-02-27 DIAGNOSIS — C7972 Secondary malignant neoplasm of left adrenal gland: Secondary | ICD-10-CM | POA: Diagnosis not present

## 2016-02-27 DIAGNOSIS — C772 Secondary and unspecified malignant neoplasm of intra-abdominal lymph nodes: Secondary | ICD-10-CM | POA: Diagnosis not present

## 2016-02-27 DIAGNOSIS — J90 Pleural effusion, not elsewhere classified: Secondary | ICD-10-CM

## 2016-02-27 DIAGNOSIS — C3411 Malignant neoplasm of upper lobe, right bronchus or lung: Secondary | ICD-10-CM

## 2016-02-27 DIAGNOSIS — Z5111 Encounter for antineoplastic chemotherapy: Secondary | ICD-10-CM

## 2016-02-27 LAB — CBC WITH DIFFERENTIAL/PLATELET
BASO%: 0.6 % (ref 0.0–2.0)
Basophils Absolute: 0 10*3/uL (ref 0.0–0.1)
EOS%: 0.2 % (ref 0.0–7.0)
Eosinophils Absolute: 0 10*3/uL (ref 0.0–0.5)
HCT: 33.5 % — ABNORMAL LOW (ref 38.4–49.9)
HGB: 10.7 g/dL — ABNORMAL LOW (ref 13.0–17.1)
LYMPH#: 0.9 10*3/uL (ref 0.9–3.3)
LYMPH%: 19.4 % (ref 14.0–49.0)
MCH: 27.9 pg (ref 27.2–33.4)
MCHC: 31.9 g/dL — ABNORMAL LOW (ref 32.0–36.0)
MCV: 87.4 fL (ref 79.3–98.0)
MONO#: 0.7 10*3/uL (ref 0.1–0.9)
MONO%: 14.3 % — ABNORMAL HIGH (ref 0.0–14.0)
NEUT%: 65.5 % (ref 39.0–75.0)
NEUTROS ABS: 3 10*3/uL (ref 1.5–6.5)
PLATELETS: 394 10*3/uL (ref 140–400)
RBC: 3.83 10*6/uL — AB (ref 4.20–5.82)
RDW: 18.2 % — ABNORMAL HIGH (ref 11.0–14.6)
WBC: 4.6 10*3/uL (ref 4.0–10.3)

## 2016-02-27 LAB — COMPREHENSIVE METABOLIC PANEL
ALT: 10 U/L (ref 0–55)
ANION GAP: 10 meq/L (ref 3–11)
AST: 10 U/L (ref 5–34)
Albumin: 2.9 g/dL — ABNORMAL LOW (ref 3.5–5.0)
Alkaline Phosphatase: 125 U/L (ref 40–150)
BUN: 13.3 mg/dL (ref 7.0–26.0)
CO2: 26 meq/L (ref 22–29)
CREATININE: 0.8 mg/dL (ref 0.7–1.3)
Calcium: 9.7 mg/dL (ref 8.4–10.4)
Chloride: 108 mEq/L (ref 98–109)
EGFR: 85 mL/min/{1.73_m2} — ABNORMAL LOW (ref >90)
GLUCOSE: 95 mg/dL (ref 70–140)
Potassium: 4.4 mEq/L (ref 3.5–5.1)
Sodium: 144 mEq/L (ref 136–145)
TOTAL PROTEIN: 7.2 g/dL (ref 6.4–8.3)

## 2016-02-27 NOTE — Progress Notes (Signed)
[facility] [phone]   [phone]  OFFICE PROGRESS NOTE  [name], MD [place] [address] [place] [address]  DIAGNOSIS: Stage IV (T2b, N2, M1b) non-small cell lung cancer, adenocarcinoma, with negative EGFR mutation, presented with right upper lobe lung mass in addition to pleural based masses, mediastinal lymphadenopathy as well as metastatic disease to the left adrenal, bone and abdominal lymph nodes diagnosed in August 2016.  PRIOR THERAPY:  1) Status post right Pleurx catheter placement under the care of [name] on 03/01/2015. 2) Systemic chemotherapy with carboplatin for AUC of 5 and Alimta 500 MG/M2. First cycle on 03/14/2015. Status post 5 cycles, discontinued secondary to disease progression.  3) Second line treatment with immunotherapy with Nivolumab 240 MG IV every 2 weeks, first dose 08/15/2015. Status post 8 cycles. Last dose was given 11/21/2015, discontinued secondary to disease progression. 4) Palliative radiotherapy to the right upper lobe lung mass in addition to the mediastinal lymphadenopathy and T11 metastatic bone lesion under the care of Dr. Tammi Klippel completed 12/26/2015.  CURRENT THERAPY: Systemic chemotherapy with Alimta 500 MG/M2 every 3 weeks. First dose 01/30/2016. Treatment is currently on hold secondary to intolerance.  INTERVAL HISTORY: Sean Cox 82 y.o. male returns to the clinic today for follow-up visit. The patient is feeling fine today with no specific complaints except for fatigue and persistent upper back pain. He has rough time after the first cycle of chemotherapy with single agent Alimta with increasing fatigue and tears. He would like to hold on any further chemotherapy for now. He denied having any significant chest pain, shortness breath, cough or hemoptysis. He denied having any nausea, vomiting, diarrhea but has occasional constipation. He has no headache or visual changes.  He is here today for evaluation and discussion of his treatment options.  MEDICAL HISTORY: Past Medical History:  Diagnosis Date  . Anxiety   . Bone cancer ([facility])   . DDD (degenerative disc disease), lumbosacral   . Depression   . Detached retina   . GERD (gastroesophageal reflux disease)    in his younger years  . Glaucoma   . Heart murmur    recently dx'ed with this 4 weeks ago  . Hypercholesteremia   . Hypertension    no longer on medications  . Hypothyroidism   . IBS (irritable bowel syndrome)   . Impaired fasting glucose   . Low back pain 11/07/2015  . Malignant neoplasm of right upper lobe of lung ([facility]) 02/14/2015  . Prostate cancer ([facility])   . 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  MEDICATIONS:  Current Outpatient Prescriptions  Medication Sig Dispense Refill  . acetaminophen (TYLENOL) 500 MG tablet Take 1,000 mg by mouth every 4 (four) hours as needed for mild pain, moderate pain, fever or headache.    . APRISO 0.375 G 24 hr capsule Take 8 capsules by mouth daily.   11  . atorvastatin (LIPITOR) 20 MG tablet Take 20 mg by mouth daily.   1  . dexamethasone (DECADRON) 4 MG tablet 4 mg by mouth twice a day the day before, day of and day after the chemotherapy every 3 weeks 40 tablet 1  . dorzolamide (TRUSOPT) 2 % ophthalmic solution Place 1 drop into both eyes 3 (three) times daily.  3  . folic acid (FOLVITE) 1 MG tablet Take 1 tablet (1 mg total) by mouth  daily. 30 tablet 4  . levothyroxine (SYNTHROID, LEVOTHROID) 25 MCG tablet Take 25 mcg by mouth daily.   1  . PARoxetine (PAXIL) 40 MG tablet TAKE 1 TABLET BY MOUTH DAILY  3  . prochlorperazine (COMPAZINE) 10 MG tablet Take 1 tablet (10 mg total) by mouth every 6 (six) hours as needed for nausea or vomiting. 30 tablet 0  . senna (SENOKOT) 8.6 MG tablet Take 1 tablet by mouth daily as needed for constipation. Reported  on 12/15/2015    . timolol (TIMOPTIC) 0.5 % ophthalmic solution Place 1 drop into both eyes 3 (three) times daily. Reported on 08/30/2015  3  . amoxicillin-clavulanate (AUGMENTIN) 875-125 MG tablet TK 1 T PO BID  0   No current facility-administered medications for this visit.     SURGICAL HISTORY:  Past Surgical History:  Procedure Laterality Date  . CHEST TUBE INSERTION Right 03/01/2015   Procedure: INSERTION PLEURAL DRAINAGE CATHETER RIGHT CHEST;  Surgeon: [name], MD;  Location: [facility];  Service: Thoracic;  Laterality: Right;  . COLON SURGERY    . diverticular stricture removed     1992  . fatty tumor excision     in chest  . PROSTATECTOMY     2004- Dr Risa Grill  . REMOVAL OF PLEURAL DRAINAGE CATHETER Right 04/26/2015   Procedure: REMOVAL OF PLEURAL DRAINAGE CATHETER;  Surgeon: [name], MD;  Location: [facility];  Service: Thoracic;  Laterality: Right;  . TALC PLEURODESIS Right 04/26/2015   Procedure: Pietro Cassis;  Surgeon: [name], MD;  Location: [facility];  Service: Thoracic;  Laterality: Right;  . TONSILLECTOMY      REVIEW OF SYSTEMS:  Constitutional: positive for fatigue and weight loss Eyes: positive for increased tears Ears, nose, mouth, throat, and face: negative Respiratory: negative Cardiovascular: negative Gastrointestinal: negative Genitourinary:negative Integument/breast: negative Hematologic/lymphatic: negative Musculoskeletal:positive for back pain Neurological: negative Behavioral/Psych: negative Endocrine: negative Allergic/Immunologic: negative   PHYSICAL EXAMINATION: General appearance: alert, cooperative, fatigued and no distress Head: Normocephalic, without obvious abnormality, atraumatic Neck: no adenopathy, no JVD, supple, symmetrical, trachea midline and thyroid not enlarged, symmetric, no tenderness/mass/nodules Lymph nodes: Cervical, supraclavicular, and axillary nodes normal. Resp: clear to auscultation  bilaterally Back: symmetric, no curvature. ROM normal. No CVA tenderness. Cardio: regular rate and rhythm, S1, S2 normal, no murmur, click, rub or gallop GI: soft, non-tender; bowel sounds normal; no masses,  no organomegaly Extremities: extremities normal, atraumatic, no cyanosis or edema Neurologic: Alert and oriented X 3, normal strength and tone. Normal symmetric reflexes. Normal coordination and gait  ECOG PERFORMANCE STATUS: 1 - Symptomatic but completely ambulatory  Blood pressure 125/81, pulse 82, temperature 97.9 F (36.6 C), temperature source Oral, resp. rate 18, height '5\' 8"'$  (1.727 m), weight 171 lb (77.6 kg), SpO2 94 %.  LABORATORY DATA: Lab Results  Component Value Date   WBC 4.6 02/27/2016   HGB 10.7 (L) 02/27/2016   HCT 33.5 (L) 02/27/2016   MCV 87.4 02/27/2016   PLT 394 02/27/2016      Chemistry      Component Value Date/Time   NA 144 02/27/2016 1448   K 4.4 02/27/2016 1448   CL 100 (L) 05/23/2015 1725   CO2 26 02/27/2016 1448   BUN 13.3 02/27/2016 1448   CREATININE 0.8 02/27/2016 1448      Component Value Date/Time   CALCIUM 9.7 02/27/2016 1448   ALKPHOS 125 02/27/2016 1448   AST 10 02/27/2016 1448   ALT 10 02/27/2016 1448   BILITOT <0.30  02/27/2016 1448       RADIOGRAPHIC STUDIES: Mr Thoracic Spine W Wo Contrast  Result Date: 01/31/2016 CLINICAL DATA:  82 y/o M; metastatic lung cancer to the spine with prominent destructive lesion of the left T7 pedicle. EXAM: MRI THORACIC WITHOUT AND WITH CONTRAST TECHNIQUE: Multiplanar and multiecho pulse sequences of the thoracic spine were obtained without and with intravenous contrast. CONTRAST:  71m MULTIHANCE GADOBENATE DIMEGLUMINE 529 MG/ML IV SOLN COMPARISON:  Chest CT dated 01/19/2016 FINDINGS: MRI THORACIC SPINE FINDINGS Alignment:  Physiologic. Vertebrae: There are numerous metastasis diffusely throughout the visualized thoracic spine the vertebral bodies and posterior elements with low T1 and high T2  signal and mild enhancement most pronounced within the T5, T7, T10, and T11 vertebral bodies. No loss of vertebral body height. The lesion within the left T7 pedicle, transverse process, and articular facets at the extends into the left lateral and posterior lateral epidural space and partially effaces the left T7-T8 neural foramen which is moderately narrowed. There is no significant canal stenosis at that level. No significant epidural disease at other levels. The lesion within the T11 vertebral body has a minimal posterior extent into the right foraminal zone resulting and moderate right foraminal narrowing. Cord: Prominent curvilinear enhancement of the surface of the thoracic cord from T10 to conus which appears to be vascular on the axial postcontrast sequence. No cord compression. Otherwise no abnormal cord signal. Paraspinal and other soft tissues: Paraspinal muscles are unremarkable. Small right pleural effusion. Lung parenchyma is better assessed on recent chest CT. IMPRESSION: 1. Diffuse osseous metastasis. 2. Left T7 pedicle mass extends into the left lateral and posterior lateral epidural space and partially effaces the left T7-8 neural foramen. No significant canal stenosis. No cord compression. 3. T11 vertebral body lesion extends minimally posterior into the right neural foramen with mild foraminal narrowing. 4. Prominent curvilinear enhancement of the surface of the thoracic cord from T10 the conus appears vascular on the axial series. 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 Consider correlation with lumbar puncture. Electronically Signed   By: LKristine GarbeM.D.   On: 01/31/2016 22:38   ASSESSMENT AND PLAN: This is a pleasant 82years old white male recently diagnosed with metastatic non-small cell lung cancer, adenocarcinoma with negative EGFR mutation and negative ALK gene translocation.  He is currently undergoing systemic chemotherapy with  carboplatin and Alimta status post 5 cycles.  He tolerated this treatment well except for the visual disturbance and increased lacrimation. The recent CT scan of the chest, abdomen and pelvis showed mixed response with increase in the size of the right upper lobe lung mass but decrease in the size of the malignant right hilar lymph node. The patient is currently undergoing second line treatment with immunotherapy with Nivolumab status post 8 cycles. He tolerated the last cycle of his treatment fairly well with no significant adverse effects. Unfortunately the recent CT scan of the chest, abdomen and pelvis showed evidence for disease progression with increase in the size of the right upper lobe lung mass as well as the right hilar adenopathy. I discussed the scan results and showed the images to the patient today. He underwent palliative radiotherapy to the progressive lesion in the lung in addition to the T11 bone metastasis under the care of Dr. MTammi Klippelcompleted 12/26/2015. The recent CT scan of the chest, abdomen and pelvis showed improvement of the right upper lobe lung mass but there was progression of the metastatic skeletal disease especially T7 lesion with  questionable epidural extension of the tumor. I discussed the scan results and showed the images to the patient today. I recommended for him to have MRI of the thoracic spine to rule out epidural extension. The patient is scheduled to see Dr. Tammi Klippel tomorrow for evaluation and he may consider him for palliative radiotherapy to this area if not previously treated. The patient was started on systemic chemotherapy with single agent Alimta but he was unable to tolerate the treatment well and he would like to take a break off treatment for few weeks. I will see him back for follow-up visit in one month's for reevaluation with repeat CT scan of the chest, abdomen and pelvis for restaging of his disease. For pain management she will continue with  his current pain medication with tramadol and Tylenol for now. He agreed to the current plan. He was advised to call immediately if he has any concerning symptoms in the interval. The patient voices understanding of current disease status and treatment options and is in agreement with the current care plan.  All questions were answered. The patient knows to call the clinic with any problems, questions or concerns. We can certainly see the patient much sooner if necessary.  Disclaimer: This note was dictated with voice recognition software. Similar sounding words can inadvertently be transcribed and may not be corrected upon review.

## 2016-03-08 ENCOUNTER — Other Ambulatory Visit: Payer: Medicare Other

## 2016-03-12 ENCOUNTER — Ambulatory Visit: Payer: Medicare Other | Admitting: Internal Medicine

## 2016-03-19 ENCOUNTER — Ambulatory Visit: Payer: Medicare Other

## 2016-03-19 ENCOUNTER — Other Ambulatory Visit: Payer: Medicare Other

## 2016-03-19 ENCOUNTER — Ambulatory Visit: Payer: Medicare Other | Admitting: Internal Medicine

## 2016-03-25 ENCOUNTER — Telehealth: Payer: Self-pay | Admitting: Internal Medicine

## 2016-03-25 NOTE — Telephone Encounter (Signed)
10/24 Appointment rescheduled to 10/20 per patient request.

## 2016-03-26 ENCOUNTER — Telehealth: Payer: Self-pay | Admitting: Medical Oncology

## 2016-03-26 NOTE — Telephone Encounter (Addendum)
Asking advice about how many boost to drink . Currently he is drinking one /day. he is maintaining his weight on 1/day and eating 2-3 meals a day. I recommended 1-2 cans /day -

## 2016-03-29 ENCOUNTER — Other Ambulatory Visit (HOSPITAL_BASED_OUTPATIENT_CLINIC_OR_DEPARTMENT_OTHER): Payer: Medicare Other

## 2016-03-29 DIAGNOSIS — J90 Pleural effusion, not elsewhere classified: Secondary | ICD-10-CM

## 2016-03-29 DIAGNOSIS — C3411 Malignant neoplasm of upper lobe, right bronchus or lung: Secondary | ICD-10-CM

## 2016-03-29 DIAGNOSIS — C7951 Secondary malignant neoplasm of bone: Secondary | ICD-10-CM

## 2016-03-29 DIAGNOSIS — Z5111 Encounter for antineoplastic chemotherapy: Secondary | ICD-10-CM

## 2016-03-29 LAB — COMPREHENSIVE METABOLIC PANEL
ALBUMIN: 3 g/dL — AB (ref 3.5–5.0)
ALK PHOS: 125 U/L (ref 40–150)
ALT: 8 U/L (ref 0–55)
ANION GAP: 10 meq/L (ref 3–11)
AST: 11 U/L (ref 5–34)
BILIRUBIN TOTAL: 0.31 mg/dL (ref 0.20–1.20)
BUN: 13.4 mg/dL (ref 7.0–26.0)
CO2: 27 mEq/L (ref 22–29)
Calcium: 10.1 mg/dL (ref 8.4–10.4)
Chloride: 101 mEq/L (ref 98–109)
Creatinine: 0.9 mg/dL (ref 0.7–1.3)
EGFR: 80 mL/min/{1.73_m2} — AB (ref >90)
Glucose: 154 mg/dl — ABNORMAL HIGH (ref 70–140)
Potassium: 4.4 mEq/L (ref 3.5–5.1)
Sodium: 138 mEq/L (ref 136–145)
TOTAL PROTEIN: 7.7 g/dL (ref 6.4–8.3)

## 2016-03-29 LAB — CBC WITH DIFFERENTIAL/PLATELET
BASO%: 0.2 % (ref 0.0–2.0)
Basophils Absolute: 0 10*3/uL (ref 0.0–0.1)
EOS ABS: 0 10*3/uL (ref 0.0–0.5)
EOS%: 0.2 % (ref 0.0–7.0)
HCT: 35.2 % — ABNORMAL LOW (ref 38.4–49.9)
HEMOGLOBIN: 11.1 g/dL — AB (ref 13.0–17.1)
LYMPH%: 14 % (ref 14.0–49.0)
MCH: 28.2 pg (ref 27.2–33.4)
MCHC: 31.5 g/dL — ABNORMAL LOW (ref 32.0–36.0)
MCV: 89.6 fL (ref 79.3–98.0)
MONO#: 0.6 10*3/uL (ref 0.1–0.9)
MONO%: 8.7 % (ref 0.0–14.0)
NEUT%: 76.9 % — ABNORMAL HIGH (ref 39.0–75.0)
NEUTROS ABS: 4.9 10*3/uL (ref 1.5–6.5)
PLATELETS: 258 10*3/uL (ref 140–400)
RBC: 3.93 10*6/uL — ABNORMAL LOW (ref 4.20–5.82)
RDW: 16.2 % — AB (ref 11.0–14.6)
WBC: 6.4 10*3/uL (ref 4.0–10.3)
lymph#: 0.9 10*3/uL (ref 0.9–3.3)

## 2016-04-02 ENCOUNTER — Telehealth: Payer: Self-pay | Admitting: Medical Oncology

## 2016-04-02 ENCOUNTER — Ambulatory Visit (HOSPITAL_COMMUNITY)
Admission: RE | Admit: 2016-04-02 | Discharge: 2016-04-02 | Disposition: A | Discharge status: Home / Self Care | Payer: Medicare Other | Source: Ambulatory Visit | Attending: Internal Medicine | Admitting: Internal Medicine

## 2016-04-02 ENCOUNTER — Encounter (HOSPITAL_COMMUNITY): Payer: Self-pay

## 2016-04-02 ENCOUNTER — Other Ambulatory Visit: Payer: Medicare Other

## 2016-04-02 DIAGNOSIS — Z5111 Encounter for antineoplastic chemotherapy: Secondary | ICD-10-CM | POA: Insufficient documentation

## 2016-04-02 DIAGNOSIS — C7972 Secondary malignant neoplasm of left adrenal gland: Secondary | ICD-10-CM | POA: Insufficient documentation

## 2016-04-02 DIAGNOSIS — C7971 Secondary malignant neoplasm of right adrenal gland: Secondary | ICD-10-CM | POA: Diagnosis not present

## 2016-04-02 DIAGNOSIS — J90 Pleural effusion, not elsewhere classified: Secondary | ICD-10-CM | POA: Insufficient documentation

## 2016-04-02 DIAGNOSIS — C7951 Secondary malignant neoplasm of bone: Secondary | ICD-10-CM | POA: Insufficient documentation

## 2016-04-02 DIAGNOSIS — C3411 Malignant neoplasm of upper lobe, right bronchus or lung: Secondary | ICD-10-CM | POA: Diagnosis present

## 2016-04-02 DIAGNOSIS — C779 Secondary and unspecified malignant neoplasm of lymph node, unspecified: Secondary | ICD-10-CM | POA: Insufficient documentation

## 2016-04-02 MED ORDER — IOPAMIDOL (ISOVUE-300) INJECTION 61%
100.0000 mL | Freq: Once | INTRAVENOUS | Status: AC | PRN
  Administered 2016-04-02: 100 mL via INTRAVENOUS

## 2016-04-02 NOTE — Telephone Encounter (Signed)
asking if he can get results from scan this week so he can start chemo soon if he needs it. Call his wife cell phone. [phone]. He still is still very fatigued and has a hard time just pulling himself up from sitting. He still walks his dog. I recommended that he contact his provider who manages his synthroid.

## 2016-04-03 ENCOUNTER — Telehealth: Payer: Self-pay | Admitting: Nutrition

## 2016-04-03 ENCOUNTER — Telehealth: Payer: Self-pay | Admitting: Medical Oncology

## 2016-04-03 NOTE — Telephone Encounter (Signed)
Pt given appt for tomorrow am , but he will wait until next week as scheduled.

## 2016-04-03 NOTE — Telephone Encounter (Signed)
I contacted patient by telephone. Patient continues to be distressed and confused by his ongoing fatigue. He reports he generally eats 2-3 meals daily.  He does not snack.  He reports he drinks between 0 and one boost daily. Educated patient to consume small frequent meals and snacks throughout the day and continue to strive for weight maintenance. Recommended boost plus once daily. Patient reports he appreciates information.

## 2016-04-09 ENCOUNTER — Ambulatory Visit (HOSPITAL_BASED_OUTPATIENT_CLINIC_OR_DEPARTMENT_OTHER): Payer: Medicare Other | Admitting: Internal Medicine

## 2016-04-09 ENCOUNTER — Encounter: Payer: Self-pay | Admitting: Internal Medicine

## 2016-04-09 ENCOUNTER — Telehealth: Payer: Self-pay | Admitting: Internal Medicine

## 2016-04-09 VITALS — BP 134/79 | HR 97 | Temp 97.5°F | Resp 16 | Ht 68.0 in | Wt 166.3 lb

## 2016-04-09 DIAGNOSIS — C7972 Secondary malignant neoplasm of left adrenal gland: Secondary | ICD-10-CM

## 2016-04-09 DIAGNOSIS — C3411 Malignant neoplasm of upper lobe, right bronchus or lung: Secondary | ICD-10-CM | POA: Diagnosis not present

## 2016-04-09 DIAGNOSIS — C7951 Secondary malignant neoplasm of bone: Secondary | ICD-10-CM

## 2016-04-09 NOTE — Progress Notes (Signed)
[facility] [phone]   [phone]  OFFICE PROGRESS NOTE  Sean Shelling, MD [place] [address] [place] [address]  DIAGNOSIS: Stage IV (T2b, N2, M1b) non-small cell lung cancer, adenocarcinoma, with negative EGFR mutation, presented with right upper lobe lung mass in addition to pleural based masses, mediastinal lymphadenopathy as well as metastatic disease to the left adrenal, bone and abdominal lymph nodes diagnosed in August 2016.  PRIOR THERAPY:  1) Status post right Pleurx catheter placement under the care of [name] on 03/01/2015. 2) Systemic chemotherapy with carboplatin for AUC of 5 and Alimta 500 MG/M2. First cycle on 03/14/2015. Status post 5 cycles, discontinued secondary to disease progression.  3) Second line treatment with immunotherapy with Nivolumab 240 MG IV every 2 weeks, first dose 08/15/2015. Status post 8 cycles. Last dose was given 11/21/2015, discontinued secondary to disease progression. 4) Palliative radiotherapy to the right upper lobe lung mass in addition to the mediastinal lymphadenopathy and T11 metastatic bone lesion under the care of Dr. Tammi Klippel completed 12/26/2015. 5) Systemic chemotherapy with Alimta 500 MG/M2 every 3 weeks. First dose 01/30/2016. Treatment is currently on hold secondary to intolerance.  CURRENT THERAPY: Observation.  INTERVAL HISTORY: Sean Cox 82 y.o. male returns to the clinic today for follow-up visit. The patient is feeling fine today with no specific complaints except for fatigue, persistent upper back pain and constipation. He has been on observation for the last 2 months. He denied having any significant chest pain, shortness of breath, cough or hemoptysis. He denied having any nausea, vomiting, diarrhea but has occasional constipation. He has no headache or visual changes. He had repeat CT scan of the chest, abdomen and pelvis performed recently and he is here for  evaluation and discussion of his scan results and treatment options.Sean Cox  MEDICAL HISTORY: Past Medical History:  Diagnosis Date  . Anxiety   . Bone cancer ([facility])   . DDD (degenerative disc disease), lumbosacral   . Depression   . Detached retina   . GERD (gastroesophageal reflux disease)    in his younger years  . Glaucoma   . Heart murmur    recently dx'ed with this 4 weeks ago  . Hypercholesteremia   . Hypertension    no longer on medications  . Hypothyroidism   . IBS (irritable bowel syndrome)   . Impaired fasting glucose   . Low back pain 11/07/2015  . Malignant neoplasm of right upper lobe of lung ([facility]) 02/14/2015  . Prostate cancer ([facility])   . 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  MEDICATIONS:  Current Outpatient Prescriptions  Medication Sig Dispense Refill  . acetaminophen (TYLENOL) 500 MG tablet Take 1,000 mg by mouth every 4 (four) hours as needed for mild pain, moderate pain, fever or headache.    Sean Cox amoxicillin-clavulanate (AUGMENTIN) 875-125 MG tablet TK 1 T PO BID  0  . APRISO 0.375 G 24 hr capsule Take 8 capsules by mouth daily.   11  . atorvastatin (LIPITOR) 20 MG tablet Take 20 mg by mouth daily.   1  . dexamethasone (DECADRON) 4 MG tablet 4 mg by mouth twice a day the day before, day of and day after the chemotherapy every 3 weeks 40 tablet 1  . dorzolamide (TRUSOPT) 2 % ophthalmic solution Place 1 drop into both eyes 3 (three) times daily.  3  .  folic acid (FOLVITE) 1 MG tablet Take 1 tablet (1 mg total) by mouth daily. 30 tablet 4  . levothyroxine (SYNTHROID, LEVOTHROID) 25 MCG tablet Take 25 mcg by mouth daily.   1  . PARoxetine (PAXIL) 40 MG tablet TAKE 1 TABLET BY MOUTH DAILY  3  . prochlorperazine (COMPAZINE) 10 MG tablet Take 1 tablet (10 mg total) by mouth every 6 (six) hours as needed for nausea or vomiting. 30 tablet 0  . senna (SENOKOT) 8.6 MG  tablet Take 1 tablet by mouth daily as needed for constipation. Reported on 12/15/2015    . timolol (TIMOPTIC) 0.5 % ophthalmic solution Place 1 drop into both eyes 3 (three) times daily. Reported on 08/30/2015  3   No current facility-administered medications for this visit.     SURGICAL HISTORY:  Past Surgical History:  Procedure Laterality Date  . CHEST TUBE INSERTION Right 03/01/2015   Procedure: INSERTION PLEURAL DRAINAGE CATHETER RIGHT CHEST;  Surgeon: [name], MD;  Location: [facility];  Service: Thoracic;  Laterality: Right;  . COLON SURGERY    . diverticular stricture removed     1992  . fatty tumor excision     in chest  . PROSTATECTOMY     2004- Dr Risa Grill  . REMOVAL OF PLEURAL DRAINAGE CATHETER Right 04/26/2015   Procedure: REMOVAL OF PLEURAL DRAINAGE CATHETER;  Surgeon: [name], MD;  Location: [facility];  Service: Thoracic;  Laterality: Right;  . TALC PLEURODESIS Right 04/26/2015   Procedure: Pietro Cassis;  Surgeon: [name], MD;  Location: [facility];  Service: Thoracic;  Laterality: Right;  . TONSILLECTOMY      REVIEW OF SYSTEMS:  Constitutional: positive for fatigue and weight loss Eyes: negative Ears, nose, mouth, throat, and face: negative Respiratory: negative Cardiovascular: negative Gastrointestinal: positive for constipation Genitourinary:negative Integument/breast: negative Hematologic/lymphatic: negative Musculoskeletal:positive for back pain Neurological: negative Behavioral/Psych: negative Endocrine: negative Allergic/Immunologic: negative   PHYSICAL EXAMINATION: General appearance: alert, cooperative, fatigued and no distress Head: Normocephalic, without obvious abnormality, atraumatic Neck: no adenopathy, no JVD, supple, symmetrical, trachea midline and thyroid not enlarged, symmetric, no tenderness/mass/nodules Lymph nodes: Cervical, supraclavicular, and axillary nodes normal. Resp: clear to auscultation  bilaterally Back: symmetric, no curvature. ROM normal. No CVA tenderness. Cardio: regular rate and rhythm, S1, S2 normal, no murmur, click, rub or gallop GI: soft, non-tender; bowel sounds normal; no masses,  no organomegaly Extremities: extremities normal, atraumatic, no cyanosis or edema Neurologic: Alert and oriented X 3, normal strength and tone. Normal symmetric reflexes. Normal coordination and gait  ECOG PERFORMANCE STATUS: 1 - Symptomatic but completely ambulatory  There were no vitals taken for this visit.  LABORATORY DATA: Lab Results  Component Value Date   WBC 6.4 03/29/2016   HGB 11.1 (L) 03/29/2016   HCT 35.2 (L) 03/29/2016   MCV 89.6 03/29/2016   PLT 258 03/29/2016      Chemistry      Component Value Date/Time   NA 138 03/29/2016 1403   K 4.4 03/29/2016 1403   CL 100 (L) 05/23/2015 1725   CO2 27 03/29/2016 1403   BUN 13.4 03/29/2016 1403   CREATININE 0.9 03/29/2016 1403      Component Value Date/Time   CALCIUM 10.1 03/29/2016 1403   ALKPHOS 125 03/29/2016 1403   AST 11 03/29/2016 1403   ALT 8 03/29/2016 1403   BILITOT 0.31 03/29/2016 1403       RADIOGRAPHIC STUDIES: Ct Chest W Contrast  Result Date: 04/03/2016 CLINICAL DATA:  Metastatic  lung cancer EXAM: CT CHEST, ABDOMEN, AND PELVIS WITH CONTRAST TECHNIQUE: Multidetector CT imaging of the chest, abdomen and pelvis was performed following the standard protocol during bolus administration of intravenous contrast. CONTRAST:  138m ISOVUE-300 IOPAMIDOL (ISOVUE-300) INJECTION 61% COMPARISON:  01/19/2016 FINDINGS: CT CHEST FINDINGS Cardiovascular: The heart size is normal. No pericardial effusion. Aortic atherosclerosis Mediastinum/Nodes: The trachea appears patent and is midline. Normal appearance of the esophagus. Right paratracheal lymph node measures 7 mm, image 26 of series 2. Previously 1 cm. Lungs/Pleura: Small right pleural effusion, unchanged. The right upper lobe lung mass measures 5.1 x 3.9 cm, image  40 of series 5. Previously 5.4 x 5.5 cm. Perifissural nodule in the right upper lobe measures 5 mm, image 75 of series 5. Previously 6 mm. The index left lower lobe lung nodule measures 8 mm, image 89 of series 5. Previously 6 mm. Index left lower lobe lung nodule measures 7 mm, image 107 of series 5. Unchanged from previous exam. Musculoskeletal: Treated bone metastases are again noted involving T9, T10 and T11. There has been increase sclerosis associated with the previously identified lytic lesion involving the posterior elements of the T6 vertebra. CT ABDOMEN PELVIS FINDINGS Hepatobiliary: No suspicious liver abnormality. The gallbladder is normal. No biliary dilatation. Pancreas: The pancreas is unremarkable. Spleen: Normal appearance of the spleen. Adrenals/Urinary Tract: Left adrenal nodule is stable measuring 2.1 by 2.6 cm, image 57 of series 2. Stable right adrenal nodule measuring 2.1 x 1.2 cm. The kidneys are unremarkable. No mass or hydronephrosis. Urinary bladder is normal. Stomach/Bowel: The stomach is normal. The small bowel loops have a normal course and caliber. There is no evidence for bowel obstruction. No pathologic dilatation of the colon. Vascular/Lymphatic: Aortic atherosclerosis is identified. The infrarenal abdominal aorta measures 3.2 cm, image 75 of series 2. There is an enlarged celiac trunk lymph node which measures 1.8 cm, image 58 of series 2. Previously 1.6 cm. Para-aortic lymph node measures 8 mm, image 81 of series 2. Previously this measured the same. No pelvic or inguinal adenopathy. Reproductive: Previous prostatectomy. Other: There is no ascites or focal fluid collections within the abdomen or pelvis. Musculoskeletal: Lytic lesion involving the left iliac bone measures 2 cm and is unchanged from previous exam, image 95 of series 2. There is asymmetric sclerosis involving the left iliac wing which is also stable from previous exam. No new or progressive bone metastases  identified. IMPRESSION: 1. The dominant index lesion within the right upper lobe has decreased in size in the interval. There has also been interval improvement and right paratracheal lymph node metastases. 2. Stable appearance of bilateral adrenal metastasis. 3. No significant change in upper abdominal lymph node metastases. 4. Interval healing of lytic bone metastases involving the posterior elements of T6. Other bone metastases are either stable or demonstrate changes of interval healing. Electronically Signed   By: TKerby MoorsM.D.   On: 04/03/2016 08:28   Ct Abdomen Pelvis W Contrast  Result Date: 04/03/2016 CLINICAL DATA:  Metastatic lung cancer EXAM: CT CHEST, ABDOMEN, AND PELVIS WITH CONTRAST TECHNIQUE: Multidetector CT imaging of the chest, abdomen and pelvis was performed following the standard protocol during bolus administration of intravenous contrast. CONTRAST:  1023mISOVUE-300 IOPAMIDOL (ISOVUE-300) INJECTION 61% COMPARISON:  01/19/2016 FINDINGS: CT CHEST FINDINGS Cardiovascular: The heart size is normal. No pericardial effusion. Aortic atherosclerosis Mediastinum/Nodes: The trachea appears patent and is midline. Normal appearance of the esophagus. Right paratracheal lymph node measures 7 mm, image 26 of series 2. Previously 1  cm. Lungs/Pleura: Small right pleural effusion, unchanged. The right upper lobe lung mass measures 5.1 x 3.9 cm, image 40 of series 5. Previously 5.4 x 5.5 cm. Perifissural nodule in the right upper lobe measures 5 mm, image 75 of series 5. Previously 6 mm. The index left lower lobe lung nodule measures 8 mm, image 89 of series 5. Previously 6 mm. Index left lower lobe lung nodule measures 7 mm, image 107 of series 5. Unchanged from previous exam. Musculoskeletal: Treated bone metastases are again noted involving T9, T10 and T11. There has been increase sclerosis associated with the previously identified lytic lesion involving the posterior elements of the T6 vertebra.  CT ABDOMEN PELVIS FINDINGS Hepatobiliary: No suspicious liver abnormality. The gallbladder is normal. No biliary dilatation. Pancreas: The pancreas is unremarkable. Spleen: Normal appearance of the spleen. Adrenals/Urinary Tract: Left adrenal nodule is stable measuring 2.1 by 2.6 cm, image 57 of series 2. Stable right adrenal nodule measuring 2.1 x 1.2 cm. The kidneys are unremarkable. No mass or hydronephrosis. Urinary bladder is normal. Stomach/Bowel: The stomach is normal. The small bowel loops have a normal course and caliber. There is no evidence for bowel obstruction. No pathologic dilatation of the colon. Vascular/Lymphatic: Aortic atherosclerosis is identified. The infrarenal abdominal aorta measures 3.2 cm, image 75 of series 2. There is an enlarged celiac trunk lymph node which measures 1.8 cm, image 58 of series 2. Previously 1.6 cm. Para-aortic lymph node measures 8 mm, image 81 of series 2. Previously this measured the same. No pelvic or inguinal adenopathy. Reproductive: Previous prostatectomy. Other: There is no ascites or focal fluid collections within the abdomen or pelvis. Musculoskeletal: Lytic lesion involving the left iliac bone measures 2 cm and is unchanged from previous exam, image 95 of series 2. There is asymmetric sclerosis involving the left iliac wing which is also stable from previous exam. No new or progressive bone metastases identified. IMPRESSION: 1. The dominant index lesion within the right upper lobe has decreased in size in the interval. There has also been interval improvement and right paratracheal lymph node metastases. 2. Stable appearance of bilateral adrenal metastasis. 3. No significant change in upper abdominal lymph node metastases. 4. Interval healing of lytic bone metastases involving the posterior elements of T6. Other bone metastases are either stable or demonstrate changes of interval healing. Electronically Signed   By: Kerby Moors M.D.   On: 04/03/2016 08:28    ASSESSMENT AND PLAN: This is a pleasant 82 years old white male recently diagnosed with metastatic non-small cell lung cancer, adenocarcinoma with negative EGFR mutation and negative ALK gene translocation.  He is currently undergoing systemic chemotherapy with carboplatin and Alimta status post 5 cycles.  He tolerated this treatment well except for the visual disturbance and increased lacrimation. The recent CT scan of the chest, abdomen and pelvis showed mixed response with increase in the size of the right upper lobe lung mass but decrease in the size of the malignant right hilar lymph node. The patient is currently undergoing second line treatment with immunotherapy with Nivolumab status post 8 cycles. He tolerated the last cycle of his treatment fairly well with no significant adverse effects. Unfortunately the recent CT scan of the chest, abdomen and pelvis showed evidence for disease progression with increase in the size of the right upper lobe lung mass as well as the right hilar adenopathy. I discussed the scan results and showed the images to the patient today. He underwent palliative radiotherapy to the progressive lesion  in the lung in addition to the T11 bone metastasis under the care of Dr. Tammi Klippel completed 12/26/2015. The recent CT scan of the chest, abdomen and pelvis showed improvement of the right upper lobe lung mass but there was progression of the metastatic skeletal disease especially T7 lesion with questionable epidural extension of the tumor. He also received palliative radiotherapy to the T7 lesion under the care of Dr. Lisbeth Renshaw. He has been observation for the last 2 months and the recent CT scan of the chest, abdomen and pelvis showed improvement of his disease in the lung as well as healing bone process. I discussed the scan results with the patient today. I recommended for him to continue on observation with repeat CT scan of the chest, abdomen and pelvis in 2 months. The  patient had several questions today and I answered them completely to his satisfaction. For pain management she will continue with his current pain medication with tramadol and Tylenol for now. He agreed to the current plan. He was advised to call immediately if he has any concerning symptoms in the interval. The patient voices understanding of current disease status and treatment options and is in agreement with the current care plan.  All questions were answered. The patient knows to call the clinic with any problems, questions or concerns. We can certainly see the patient much sooner if necessary.  Disclaimer: This note was dictated with voice recognition software. Similar sounding words can inadvertently be transcribed and may not be corrected upon review.

## 2016-04-09 NOTE — Telephone Encounter (Signed)
Gave patient avs report and appointments for January. Central radiology will call re scan.  °

## 2016-04-16 ENCOUNTER — Telehealth: Payer: Self-pay | Admitting: Internal Medicine

## 2016-04-16 ENCOUNTER — Telehealth: Payer: Self-pay | Admitting: Radiation Oncology

## 2016-04-16 ENCOUNTER — Telehealth: Payer: Self-pay | Admitting: *Deleted

## 2016-04-16 NOTE — Telephone Encounter (Signed)
Oncology Nurse Navigator Documentation  Oncology Nurse Navigator Flowsheets 04/16/2016  Navigator Location [facility]  Navigator Encounter Type Telephone/Sean Cox called and spoke to triage nurse.  The conversation did not go well.  I called Sean Cox. I listened as he explained.  He would like to be seen with Rad Onc asap.  I updated rad onc scheduling, nurse and PA of his request.    Telephone Outgoing Call  Treatment Phase Follow-up  Barriers/Navigation Needs Education;Coordination of Care  Education Other  Interventions Coordination of Care;Education  Coordination of Care Other;Appts  Education Method Verbal  Acuity Level 2 Assistance expediting appointments  Time Spent with Patient 30

## 2016-04-16 NOTE — Telephone Encounter (Signed)
Returned call to patient. No specific reason given for call. Left a voicemail message for patient confirm next scheduled appointments and requested that the patient return call with any further questions and/or concerns.  1/2 appointment rescheduled to 12/26 at 3PM per patient request and schedule message.

## 2016-04-16 NOTE — Telephone Encounter (Signed)
I left a message for the patient to call me back about his concerns about needing additional radiation treatment.

## 2016-04-16 NOTE — Telephone Encounter (Signed)
"  I need to set up a conference with Dr. Tammi Klippel.  Call transferred ext [phone].

## 2016-04-17 ENCOUNTER — Telehealth: Payer: Self-pay | Admitting: Radiation Oncology

## 2016-04-18 ENCOUNTER — Telehealth: Payer: Self-pay | Admitting: *Deleted

## 2016-04-18 MED ORDER — OXYCODONE HCL 5 MG PO TABS: 5.0000 mg | ORAL_TABLET | Freq: Four times a day (QID) | ORAL | 0 refills | Status: DC | PRN

## 2016-04-18 NOTE — Telephone Encounter (Signed)
I spoke with the patient to let him know we would recommend he use pain medication, and meet back with Dr. Tammi Klippel to review options for treatment. A new prescription will be given to the patient for Oxycodone and he will pick this up today.

## 2016-04-18 NOTE — Telephone Encounter (Signed)
CALLED PATIENT TO INFORM OF FU APPT. WITH ALISON PERKINS ON 04-29-16 @ 2:30 PM, SPOKE WITH PATIENT AND HE IS AWARE OF THIS APPT.

## 2016-04-19 ENCOUNTER — Ambulatory Visit: Payer: Medicare Other | Admitting: Radiation Oncology

## 2016-04-22 ENCOUNTER — Telehealth: Payer: Self-pay | Admitting: *Deleted

## 2016-04-22 ENCOUNTER — Encounter: Payer: Self-pay | Admitting: *Deleted

## 2016-04-22 NOTE — Telephone Encounter (Signed)
Call received from patient requesting time for appointment on 04/29/16 with Dr. Tammi Klippel.  Patient informed that he will be seeing Shona Simpson PA on 04/29/16 at 2:30PM.  Patient appreciative of information and has no further questions at this time.

## 2016-04-22 NOTE — Progress Notes (Signed)
Oncology Nurse Navigator Documentation  Oncology Nurse Navigator Flowsheets 04/22/2016  Navigator Location [facility]  Navigator Encounter Type Other/Mr. Colley would like to change his appt from 06/19/15 to 06/12/15.  I notified scheduling to call him to re-schedule.   Treatment Phase Follow-up  Barriers/Navigation Needs Coordination of Care  Interventions Coordination of Care  Coordination of Care Appts  Acuity Level 1  Acuity Level 1 Minimal follow up required  Time Spent with Patient 30

## 2016-04-26 NOTE — Progress Notes (Signed)
Mr. Sean Cox. Cefalu is here for follow up appointment for stage IV right upper lobe lung cancer with metastases to the bone.   Weight and vital signs are stable.  Denies any numbness today.   Reports having constipation taking Miralax changed to MOM.  Having pain at times to mid right side around taking Oxycodone with some relief; also taking Ibufrofen.  Appetite is altered has eaten one meal the past two days.  Reports he is walking his dog daily. Wt Readings from Last 3 Encounters:  04/29/16 163 lb 6.4 oz (74.1 kg)  04/09/16 166 lb 4.8 oz (75.4 kg)  02/27/16 171 lb (77.6 kg)  BP 108/81   Pulse 92   Temp 97.9 F (36.6 C) (Oral)   Resp 18   Ht '5\' 8"'$  (1.727 m)   Wt 163 lb 6.4 oz (74.1 kg)   BMI 24.84 kg/m  :

## 2016-04-29 ENCOUNTER — Ambulatory Visit
Admission: RE | Admit: 2016-04-29 | Discharge: 2016-04-29 | Disposition: A | Discharge status: Home / Self Care | Payer: Medicare Other | Source: Ambulatory Visit | Attending: Radiation Oncology | Admitting: Radiation Oncology

## 2016-04-29 ENCOUNTER — Encounter: Payer: Self-pay | Admitting: Radiation Oncology

## 2016-04-29 VITALS — BP 108/81 | HR 92 | Temp 97.9°F | Resp 18 | Ht 68.0 in | Wt 163.4 lb

## 2016-04-29 DIAGNOSIS — H3320 Serous retinal detachment, unspecified eye: Secondary | ICD-10-CM | POA: Insufficient documentation

## 2016-04-29 DIAGNOSIS — E7800 Pure hypercholesterolemia, unspecified: Secondary | ICD-10-CM | POA: Diagnosis not present

## 2016-04-29 DIAGNOSIS — Z87891 Personal history of nicotine dependence: Secondary | ICD-10-CM | POA: Diagnosis not present

## 2016-04-29 DIAGNOSIS — M5137 Other intervertebral disc degeneration, lumbosacral region: Secondary | ICD-10-CM | POA: Diagnosis not present

## 2016-04-29 DIAGNOSIS — K589 Irritable bowel syndrome without diarrhea: Secondary | ICD-10-CM | POA: Insufficient documentation

## 2016-04-29 DIAGNOSIS — I700 Atherosclerosis of aorta: Secondary | ICD-10-CM | POA: Diagnosis not present

## 2016-04-29 DIAGNOSIS — H409 Unspecified glaucoma: Secondary | ICD-10-CM | POA: Diagnosis not present

## 2016-04-29 DIAGNOSIS — K5903 Drug induced constipation: Secondary | ICD-10-CM

## 2016-04-29 DIAGNOSIS — C7971 Secondary malignant neoplasm of right adrenal gland: Secondary | ICD-10-CM | POA: Diagnosis not present

## 2016-04-29 DIAGNOSIS — E039 Hypothyroidism, unspecified: Secondary | ICD-10-CM | POA: Insufficient documentation

## 2016-04-29 DIAGNOSIS — K219 Gastro-esophageal reflux disease without esophagitis: Secondary | ICD-10-CM | POA: Diagnosis not present

## 2016-04-29 DIAGNOSIS — Z8546 Personal history of malignant neoplasm of prostate: Secondary | ICD-10-CM | POA: Insufficient documentation

## 2016-04-29 DIAGNOSIS — F419 Anxiety disorder, unspecified: Secondary | ICD-10-CM | POA: Insufficient documentation

## 2016-04-29 DIAGNOSIS — C3411 Malignant neoplasm of upper lobe, right bronchus or lung: Secondary | ICD-10-CM | POA: Diagnosis present

## 2016-04-29 DIAGNOSIS — I10 Essential (primary) hypertension: Secondary | ICD-10-CM | POA: Diagnosis not present

## 2016-04-29 DIAGNOSIS — C7951 Secondary malignant neoplasm of bone: Secondary | ICD-10-CM | POA: Diagnosis present

## 2016-04-29 DIAGNOSIS — C7972 Secondary malignant neoplasm of left adrenal gland: Secondary | ICD-10-CM | POA: Diagnosis not present

## 2016-04-29 DIAGNOSIS — F329 Major depressive disorder, single episode, unspecified: Secondary | ICD-10-CM | POA: Insufficient documentation

## 2016-04-29 NOTE — Progress Notes (Signed)
Radiation Oncology         [phone] ________________________________  Name: JOHNPAUL GILLENTINE MRN: [id number]  Date: 04/29/2016  DOB: [birth year]  Follow-Up Visit Note  CC: [name], MD  Curt Bears, MD  Diagnosis:   82 y.o. 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 Malignant neoplasm of right upper lobe of lung (HCC) 162.3 C34.11   2. 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 CT of the chest/abd/pelvis on 01/19/16 showed that the RUL mass has decreased in size, but there was progression of osseous metastatic disease and mild progression of retroperitoneal adenopathy. MRI of the thoracic spine on 01/31/16 showed diffuse osseous metastases; such as a left T7 pedicle mass extending into the left lateral and posterior lateral epidural space and partially effacing the left T7-8 neural foramen with no significant canal stenosis or cord compression, a T11 vertebral body lesion extending minimally posterior into the right neural foramen with mild foraminal narrowing, and prominent curvilinear enhancement of the surface of the thoracic cord from T10. At the time of his visit with Dr. Tammi Klippel in September when this was reviewed, the patient was asymptomatic. He had repeat CT c/a/p on 04/02/16 which revealed stability of disease in the thoracic spine, and decreased primary tumor in the right upper lobe. Right paratracheal node disease was also improved. In the last 3 weeks, the patient however has become symptomatic in the region of the low thoracic spine and has been counseled on the use of oxycodone for relief. He comes today to discuss his progress.   On review of systems, the patient  reports his pain continues to be problematic. He denies any progressive neuropathic symptoms, but states his pain is becoming more sharp in nature around the right flank. He denies any lower extremity weakness, loss of bowel or bladder function, fevers, chills, nausea, vomiting, chest pain, or shortness of breath. He does describe severe constipation and has not had a good BM in about 1 week. He reports taking oxycodone q 6 hours with minimal relief. A complete review of systems is obtained and is otherwise negative. Past Medical History:  Past Medical History:  Diagnosis Date  . Anxiety   . Bone cancer ([place])   . DDD (degenerative disc disease), lumbosacral   . Depression   . Detached retina   . GERD (gastroesophageal reflux disease)    in his younger years  . Glaucoma   . Heart murmur    recently dx'ed with this 4 weeks ago  . Hypercholesteremia   . Hypertension    no longer on medications  . Hypothyroidism   . IBS (irritable bowel syndrome)   . Impaired fasting glucose   . Low back pain 11/07/2015  . Malignant neoplasm of right upper lobe of lung ([place]) 02/14/2015  . Prostate cancer ([place])   . Ulcerative proctitis [facility])     Past Surgical History: Past Surgical History:  Procedure Laterality Date  . CHEST TUBE INSERTION Right 03/01/2015   Procedure: INSERTION PLEURAL DRAINAGE CATHETER RIGHT CHEST;  Surgeon: [name], MD;  Location: [place];  Service: Thoracic;  Laterality: Right;  . COLON SURGERY    .  diverticular stricture removed     1992  . fatty tumor excision     in chest  . PROSTATECTOMY     2004- Dr Risa Grill  . REMOVAL OF PLEURAL DRAINAGE CATHETER Right 04/26/2015   Procedure: REMOVAL OF PLEURAL DRAINAGE CATHETER;  Surgeon: [name], MD;  Location: [place];  Service: Thoracic;  Laterality: Right;  . TALC PLEURODESIS Right 04/26/2015   Procedure: Pietro Cassis;  Surgeon: [name], MD;  Location: [place];  Service: Thoracic;  Laterality: Right;    . TONSILLECTOMY      Social History:  Social History   Social History  . Marital status: Married    Spouse name: N/A  . Number of children: N/A  . Years of education: N/A   Occupational History  . Not on file.   Social History Main Topics  . Smoking status: Former Smoker    Packs/day: 1.00    Years: 30.00    Types: Cigarettes    Quit date: 06/10/1981  . Smokeless tobacco: Never Used  . Alcohol use 1.8 oz/week    3 Cans of beer per week  . Drug use: No  . Sexual activity: No   Other Topics Concern  . Not on file   Social History Narrative  . No narrative on file    Family History: Family History  Problem Relation Age of Onset  . Cancer Neg Hx     ALLERGIES:  is allergic to codeine; imuran [azathioprine]; remicade [infliximab]; simvastatin; and sulfa antibiotics.  Meds: Current Outpatient Prescriptions  Medication Sig Dispense Refill  . acetaminophen (TYLENOL) 500 MG tablet Take 1,000 mg by mouth every 4 (four) hours as needed for mild pain, moderate pain, fever or headache.    . APRISO 0.375 G 24 hr capsule Take 8 capsules by mouth daily.   11  . dorzolamide (TRUSOPT) 2 % ophthalmic solution Place 1 drop into both eyes 3 (three) times daily.  3  . levothyroxine (SYNTHROID, LEVOTHROID) 25 MCG tablet Take 25 mcg by mouth daily.   1  . oxyCODONE (OXY IR/ROXICODONE) 5 MG immediate release tablet Take 1 tablet (5 mg total) by mouth every 6 (six) hours as needed for severe pain. 60 tablet 0  . PARoxetine (PAXIL) 40 MG tablet TAKE 1 TABLET BY MOUTH DAILY  3  . senna (SENOKOT) 8.6 MG tablet Take 1 tablet by mouth daily as needed for constipation. Reported on 12/15/2015    . timolol (TIMOPTIC) 0.5 % ophthalmic solution Place 1 drop into both eyes 3 (three) times daily. Reported on 08/30/2015  3  . atorvastatin (LIPITOR) 20 MG tablet Take 20 mg by mouth daily.   1  . folic acid (FOLVITE) 1 MG tablet Take 1 tablet (1 mg total) by mouth daily. (Patient not taking: Reported on  04/29/2016) 30 tablet 4  . prochlorperazine (COMPAZINE) 10 MG tablet Take 1 tablet (10 mg total) by mouth every 6 (six) hours as needed for nausea or vomiting. (Patient not taking: Reported on 04/29/2016) 30 tablet 0   No current facility-administered medications for this encounter.     Physical Findings: The patient is in no acute distress. Patient is alert and oriented.  height is '5\' 8"'$  (1.727 m) and weight is 163 lb 6.4 oz (74.1 kg). His oral temperature is 97.9 F (36.6 C). His blood pressure is 108/81 and his pulse is 92. His respiration is 18.   Normal respiratory effort. Musculoskeletal exam reveals no areas of tender posterior spinous processes.  Lab Findings: Lab Results  Component Value Date   WBC 6.4 03/29/2016   WBC 8.2 03/01/2015   HGB 11.1 (L) 03/29/2016   HCT 35.2 (L) 03/29/2016   PLT 258 03/29/2016    Lab Results  Component Value Date   NA 138 03/29/2016   K 4.4 03/29/2016   CHLORIDE 101 03/29/2016   CO2 27 03/29/2016   GLUCOSE 154 (H) 03/29/2016   BUN 13.4 03/29/2016   CREATININE 0.9 03/29/2016   BILITOT 0.31 03/29/2016   ALKPHOS 125 03/29/2016   AST 11 03/29/2016   ALT 8 03/29/2016   PROT 7.7 03/29/2016   ALBUMIN 3.0 (L) 03/29/2016   CALCIUM 10.1 03/29/2016   ANIONGAP 10 03/29/2016   ANIONGAP 9 05/23/2015    Radiographic Findings: Ct Chest W Contrast  Result Date: 04/03/2016 CLINICAL DATA:  Metastatic lung cancer EXAM: CT CHEST, ABDOMEN, AND PELVIS WITH CONTRAST TECHNIQUE: Multidetector CT imaging of the chest, abdomen and pelvis was performed following the standard protocol during bolus administration of intravenous contrast. CONTRAST:  168m ISOVUE-300 IOPAMIDOL (ISOVUE-300) INJECTION 61% COMPARISON:  01/19/2016 FINDINGS: CT CHEST FINDINGS Cardiovascular: The heart size is normal. No pericardial effusion. Aortic atherosclerosis Mediastinum/Nodes: The trachea appears patent and is midline. Normal appearance of the esophagus. Right paratracheal lymph  node measures 7 mm, image 26 of series 2. Previously 1 cm. Lungs/Pleura: Small right pleural effusion, unchanged. The right upper lobe lung mass measures 5.1 x 3.9 cm, image 40 of series 5. Previously 5.4 x 5.5 cm. Perifissural nodule in the right upper lobe measures 5 mm, image 75 of series 5. Previously 6 mm. The index left lower lobe lung nodule measures 8 mm, image 89 of series 5. Previously 6 mm. Index left lower lobe lung nodule measures 7 mm, image 107 of series 5. Unchanged from previous exam. Musculoskeletal: Treated bone metastases are again noted involving T9, T10 and T11. There has been increase sclerosis associated with the previously identified lytic lesion involving the posterior elements of the T6 vertebra. CT ABDOMEN PELVIS FINDINGS Hepatobiliary: No suspicious liver abnormality. The gallbladder is normal. No biliary dilatation. Pancreas: The pancreas is unremarkable. Spleen: Normal appearance of the spleen. Adrenals/Urinary Tract: Left adrenal nodule is stable measuring 2.1 by 2.6 cm, image 57 of series 2. Stable right adrenal nodule measuring 2.1 x 1.2 cm. The kidneys are unremarkable. No mass or hydronephrosis. Urinary bladder is normal. Stomach/Bowel: The stomach is normal. The small bowel loops have a normal course and caliber. There is no evidence for bowel obstruction. No pathologic dilatation of the colon. Vascular/Lymphatic: Aortic atherosclerosis is identified. The infrarenal abdominal aorta measures 3.2 cm, image 75 of series 2. There is an enlarged celiac trunk lymph node which measures 1.8 cm, image 58 of series 2. Previously 1.6 cm. Para-aortic lymph node measures 8 mm, image 81 of series 2. Previously this measured the same. No pelvic or inguinal adenopathy. Reproductive: Previous prostatectomy. Other: There is no ascites or focal fluid collections within the abdomen or pelvis. Musculoskeletal: Lytic lesion involving the left iliac bone measures 2 cm and is unchanged from previous  exam, image 95 of series 2. There is asymmetric sclerosis involving the left iliac wing which is also stable from previous exam. No new or progressive bone metastases identified. IMPRESSION: 1. The dominant index lesion within the right upper lobe has decreased in size in the interval. There has also been interval improvement and right paratracheal lymph node metastases. 2. Stable appearance of bilateral adrenal metastasis. 3. No significant change in upper  abdominal lymph node metastases. 4. Interval healing of lytic bone metastases involving the posterior elements of T6. Other bone metastases are either stable or demonstrate changes of interval healing. Electronically Signed   By: Kerby Moors M.D.   On: 04/03/2016 08:28   Ct Abdomen Pelvis W Contrast  Result Date: 04/03/2016 CLINICAL DATA:  Metastatic lung cancer EXAM: CT CHEST, ABDOMEN, AND PELVIS WITH CONTRAST TECHNIQUE: Multidetector CT imaging of the chest, abdomen and pelvis was performed following the standard protocol during bolus administration of intravenous contrast. CONTRAST:  113m ISOVUE-300 IOPAMIDOL (ISOVUE-300) INJECTION 61% COMPARISON:  01/19/2016 FINDINGS: CT CHEST FINDINGS Cardiovascular: The heart size is normal. No pericardial effusion. Aortic atherosclerosis Mediastinum/Nodes: The trachea appears patent and is midline. Normal appearance of the esophagus. Right paratracheal lymph node measures 7 mm, image 26 of series 2. Previously 1 cm. Lungs/Pleura: Small right pleural effusion, unchanged. The right upper lobe lung mass measures 5.1 x 3.9 cm, image 40 of series 5. Previously 5.4 x 5.5 cm. Perifissural nodule in the right upper lobe measures 5 mm, image 75 of series 5. Previously 6 mm. The index left lower lobe lung nodule measures 8 mm, image 89 of series 5. Previously 6 mm. Index left lower lobe lung nodule measures 7 mm, image 107 of series 5. Unchanged from previous exam. Musculoskeletal: Treated bone metastases are again noted  involving T9, T10 and T11. There has been increase sclerosis associated with the previously identified lytic lesion involving the posterior elements of the T6 vertebra. CT ABDOMEN PELVIS FINDINGS Hepatobiliary: No suspicious liver abnormality. The gallbladder is normal. No biliary dilatation. Pancreas: The pancreas is unremarkable. Spleen: Normal appearance of the spleen. Adrenals/Urinary Tract: Left adrenal nodule is stable measuring 2.1 by 2.6 cm, image 57 of series 2. Stable right adrenal nodule measuring 2.1 x 1.2 cm. The kidneys are unremarkable. No mass or hydronephrosis. Urinary bladder is normal. Stomach/Bowel: The stomach is normal. The small bowel loops have a normal course and caliber. There is no evidence for bowel obstruction. No pathologic dilatation of the colon. Vascular/Lymphatic: Aortic atherosclerosis is identified. The infrarenal abdominal aorta measures 3.2 cm, image 75 of series 2. There is an enlarged celiac trunk lymph node which measures 1.8 cm, image 58 of series 2. Previously 1.6 cm. Para-aortic lymph node measures 8 mm, image 81 of series 2. Previously this measured the same. No pelvic or inguinal adenopathy. Reproductive: Previous prostatectomy. Other: There is no ascites or focal fluid collections within the abdomen or pelvis. Musculoskeletal: Lytic lesion involving the left iliac bone measures 2 cm and is unchanged from previous exam, image 95 of series 2. There is asymmetric sclerosis involving the left iliac wing which is also stable from previous exam. No new or progressive bone metastases identified. IMPRESSION: 1. The dominant index lesion within the right upper lobe has decreased in size in the interval. There has also been interval improvement and right paratracheal lymph node metastases. 2. Stable appearance of bilateral adrenal metastasis. 3. No significant change in upper abdominal lymph node metastases. 4. Interval healing of lytic bone metastases involving the posterior  elements of T6. Other bone metastases are either stable or demonstrate changes of interval healing. Electronically Signed   By: TKerby MoorsM.D.   On: 04/03/2016 08:28    Impression/Plan: 1.   83y.o. 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 Dr. MTammi Klippeldiscusses the findings from his recent CT imaging and reviews the rationale to  consider palliative radiotherapy to T11.  We discussed the risks, benefits, short, and long term effects of radiotherapy, and the patient is interested in proceeding. We have set him up for simulation tomorrow at 2pm. 2. Constipation. This appears to be opioid induced and we discussed the options for milk of magnesia BID until he has loose stools and switching to miralax 1-2x a day thereafter.   The above documentation reflects my direct findings during this shared patient visit. Please see the separate note by Dr. Tammi Klippel  on this date for the remainder of the patient's plan of care.     Carola Rhine, PAC

## 2016-04-30 ENCOUNTER — Ambulatory Visit
Admission: RE | Admit: 2016-04-30 | Discharge: 2016-04-30 | Disposition: A | Discharge status: Home / Self Care | Payer: Medicare Other | Source: Ambulatory Visit | Attending: Radiation Oncology | Admitting: Radiation Oncology

## 2016-04-30 DIAGNOSIS — Z510 Encounter for antineoplastic radiation therapy: Secondary | ICD-10-CM | POA: Diagnosis not present

## 2016-04-30 DIAGNOSIS — C7951 Secondary malignant neoplasm of bone: Secondary | ICD-10-CM | POA: Diagnosis present

## 2016-04-30 NOTE — Progress Notes (Signed)
  Radiation Oncology         (336) [phone] ________________________________  Name: BLADYN TIPPS MRN: [id number]  Date: 04/30/2016  DOB: [birth year]  SIMULATION AND TREATMENT PLANNING NOTE    ICD-9-CM ICD-10-CM   1. Spine metastasis ([place]) 198.5 C79.51     DIAGNOSIS:  82 yo man with painful T11 metastasis from stage IV T2b N2 M1b adenocarcinoma of the right upper lung  NARRATIVE:  The patient was brought to the [facility].  Identity was confirmed.  All relevant records and images related to the planned course of therapy were reviewed.  The patient freely provided informed written consent to proceed with treatment after reviewing the details related to the planned course of therapy. The consent form was witnessed and verified by the simulation staff.  Then, the patient was set-up in a stable reproducible  supine position for radiation therapy.  CT images were obtained.  Surface markings were placed.  The CT images were loaded into the planning software.  Then the target and avoidance structures were contoured.  Treatment planning then occurred.  The radiation prescription was entered and confirmed.  Then, I designed and supervised the construction of a total of 3 medically necessary complex treatment devices including bodyfix positioner and 2 MLCs.  I have requested : Isodose Plan.  PLAN:  The patient will receive 30 Gy in 10 fractions.  ________________________________  Sheral Apley Tammi Klippel, M.D.

## 2016-05-09 ENCOUNTER — Ambulatory Visit
Admission: RE | Admit: 2016-05-09 | Discharge: 2016-05-09 | Disposition: A | Discharge status: Home / Self Care | Payer: Medicare Other | Source: Ambulatory Visit | Attending: Radiation Oncology | Admitting: Radiation Oncology

## 2016-05-09 DIAGNOSIS — Z510 Encounter for antineoplastic radiation therapy: Secondary | ICD-10-CM | POA: Diagnosis not present

## 2016-05-10 ENCOUNTER — Ambulatory Visit
Admission: RE | Admit: 2016-05-10 | Discharge: 2016-05-10 | Disposition: A | Discharge status: Home / Self Care | Payer: Medicare Other | Source: Ambulatory Visit | Attending: Radiation Oncology | Admitting: Radiation Oncology

## 2016-05-10 VITALS — BP 123/83 | HR 83 | Resp 18 | Wt 162.6 lb

## 2016-05-10 DIAGNOSIS — C7951 Secondary malignant neoplasm of bone: Secondary | ICD-10-CM

## 2016-05-10 DIAGNOSIS — Z510 Encounter for antineoplastic radiation therapy: Secondary | ICD-10-CM | POA: Diagnosis not present

## 2016-05-10 NOTE — Progress Notes (Signed)
Weight and vitals stable. Patient is unable to score thoracic spine pain but, confirms its present. Reports taking Tylenol to manage pain in spine during the day. Reports occasional he will take Oxy IR mostly at night for severe spine pain. No edema of lower extremities noted. Patient most concerned about progressive weakness.   BP 123/83 (BP Location: Left Arm, Patient Position: Sitting, Cuff Size: Normal)   Pulse 83   Resp 18   Wt 162 lb 9.6 oz (73.8 kg)   SpO2 100%   BMI 24.72 kg/m  Wt Readings from Last 3 Encounters:  05/10/16 162 lb 9.6 oz (73.8 kg)  04/29/16 163 lb 6.4 oz (74.1 kg)  04/09/16 166 lb 4.8 oz (75.4 kg)

## 2016-05-10 NOTE — Progress Notes (Signed)
  Radiation Oncology         [phone]   Name: Sean Cox MRN: [id number]   Date: 05/10/2016  DOB: 01-19-1933   Weekly Radiation Therapy Management    ICD-9-CM ICD-10-CM   1. 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  Patient is unable to score thoracic spine pain, but confirms its present. Reports taking Tylenol to manage pain in spine during the day. Reports occasionally he will take Oxy IR mostly at night for severe spinal pain. No edema of lower extremities noted. Patient most concerned about progressive weakness.   Set-up films were reviewed. The chart was checked.  Physical Findings  weight is 162 lb 9.6 oz (73.8 kg). His blood pressure is 123/83 and his pulse is 83. His respiration is 18 and oxygen saturation is 100%. . Weight essentially stable. Weight loss of 10 lbs since September 2017. Intact sensation and motor function.  Impression The patient is tolerating radiation.  Plan Continue treatment as planned.         Sheral Apley Tammi Klippel, M.D.

## 2016-05-13 ENCOUNTER — Ambulatory Visit
Admission: RE | Admit: 2016-05-13 | Discharge: 2016-05-13 | Disposition: A | Discharge status: Home / Self Care | Payer: Medicare Other | Source: Ambulatory Visit | Attending: Radiation Oncology | Admitting: Radiation Oncology

## 2016-05-13 DIAGNOSIS — Z510 Encounter for antineoplastic radiation therapy: Secondary | ICD-10-CM | POA: Diagnosis not present

## 2016-05-14 ENCOUNTER — Ambulatory Visit
Admission: RE | Admit: 2016-05-14 | Discharge: 2016-05-14 | Disposition: A | Discharge status: Home / Self Care | Payer: Medicare Other | Source: Ambulatory Visit | Attending: Radiation Oncology | Admitting: Radiation Oncology

## 2016-05-14 DIAGNOSIS — Z510 Encounter for antineoplastic radiation therapy: Secondary | ICD-10-CM | POA: Diagnosis not present

## 2016-05-15 ENCOUNTER — Ambulatory Visit
Admission: RE | Admit: 2016-05-15 | Discharge: 2016-05-15 | Disposition: A | Discharge status: Home / Self Care | Payer: Medicare Other | Source: Ambulatory Visit | Attending: Radiation Oncology | Admitting: Radiation Oncology

## 2016-05-15 DIAGNOSIS — Z510 Encounter for antineoplastic radiation therapy: Secondary | ICD-10-CM | POA: Diagnosis not present

## 2016-05-16 ENCOUNTER — Ambulatory Visit: Admission: RE | Admit: 2016-05-16 | Payer: Medicare Other | Source: Ambulatory Visit | Admitting: Radiation Oncology

## 2016-05-16 ENCOUNTER — Ambulatory Visit
Admission: RE | Admit: 2016-05-16 | Discharge: 2016-05-16 | Disposition: A | Discharge status: Home / Self Care | Payer: Medicare Other | Source: Ambulatory Visit | Attending: Radiation Oncology | Admitting: Radiation Oncology

## 2016-05-16 DIAGNOSIS — Z510 Encounter for antineoplastic radiation therapy: Secondary | ICD-10-CM | POA: Diagnosis not present

## 2016-05-17 ENCOUNTER — Telehealth: Payer: Self-pay | Admitting: Radiation Oncology

## 2016-05-17 ENCOUNTER — Ambulatory Visit: Admission: RE | Admit: 2016-05-17 | Payer: Medicare Other | Source: Ambulatory Visit | Admitting: Radiation Oncology

## 2016-05-17 ENCOUNTER — Telehealth: Payer: Self-pay

## 2016-05-17 ENCOUNTER — Ambulatory Visit: Payer: Medicare Other

## 2016-05-17 NOTE — Telephone Encounter (Signed)
Pt is unwilling to talk with this nurse. He states a list of complaints:  weak, falling down, fatigued, cannot walk, cannot move, stumbling,  "I have to stop the treatments". He is requesting a call from [employer].

## 2016-05-17 NOTE — Telephone Encounter (Signed)
I spoke with the patient to check on his symptoms. Although he can walk, he states he is very weak and reports feeling like he's having difficulty from the radiation. He denies any numbness or tingling in his legs, or loss of control of bowel/bladder. He would like to take the weekend to consider his symptoms before deciding if he will come for treatment Monday. He will call us. That being said, he seems to think that his back pain is improved. We will follow this closely and if he has any acute onset of symptoms, he will be evaluated urgently.

## 2016-05-20 ENCOUNTER — Ambulatory Visit
Admission: RE | Admit: 2016-05-20 | Discharge: 2016-05-20 | Disposition: A | Discharge status: Home / Self Care | Payer: Medicare Other | Source: Ambulatory Visit | Attending: Radiation Oncology | Admitting: Radiation Oncology

## 2016-05-20 DIAGNOSIS — Z510 Encounter for antineoplastic radiation therapy: Secondary | ICD-10-CM | POA: Diagnosis not present

## 2016-05-21 ENCOUNTER — Ambulatory Visit
Admission: RE | Admit: 2016-05-21 | Discharge: 2016-05-21 | Disposition: A | Discharge status: Home / Self Care | Payer: Medicare Other | Source: Ambulatory Visit | Attending: Radiation Oncology | Admitting: Radiation Oncology

## 2016-05-21 DIAGNOSIS — Z510 Encounter for antineoplastic radiation therapy: Secondary | ICD-10-CM | POA: Diagnosis not present

## 2016-05-22 ENCOUNTER — Ambulatory Visit: Payer: Medicare Other

## 2016-05-22 ENCOUNTER — Ambulatory Visit: Payer: Medicare Other | Admitting: Radiation Oncology

## 2016-05-22 ENCOUNTER — Ambulatory Visit
Admission: RE | Admit: 2016-05-22 | Discharge: 2016-05-22 | Disposition: A | Discharge status: Home / Self Care | Payer: Medicare Other | Source: Ambulatory Visit | Attending: Radiation Oncology | Admitting: Radiation Oncology

## 2016-05-22 DIAGNOSIS — Z510 Encounter for antineoplastic radiation therapy: Secondary | ICD-10-CM | POA: Diagnosis not present

## 2016-05-23 ENCOUNTER — Ambulatory Visit
Admission: RE | Admit: 2016-05-23 | Discharge: 2016-05-23 | Disposition: A | Discharge status: Home / Self Care | Payer: Medicare Other | Source: Ambulatory Visit | Attending: Radiation Oncology | Admitting: Radiation Oncology

## 2016-05-23 ENCOUNTER — Encounter: Payer: Self-pay | Admitting: Radiation Oncology

## 2016-05-23 VITALS — BP 111/83 | HR 102 | Resp 16 | Wt 156.0 lb

## 2016-05-23 DIAGNOSIS — Z510 Encounter for antineoplastic radiation therapy: Secondary | ICD-10-CM | POA: Diagnosis not present

## 2016-05-23 DIAGNOSIS — C7951 Secondary malignant neoplasm of bone: Secondary | ICD-10-CM

## 2016-05-23 NOTE — Progress Notes (Signed)
  Radiation Oncology         (336) [phone] ________________________________  Name: Sean Cox MRN: [id number]  Date: 05/23/2016  DOB: [birth year]  End of Treatment Note  Diagnosis: Secondary malignant neoplasm of bone  Indication for treatment:  Palliative       Radiation treatment dates:   05/09/16 - 05/23/16  Site/dose:   T10 - T12 treated to 30 Gy in 10 fractions of 3 Gy  Beams/energy:   Isodose Plan  //  15X  Narrative: The patient tolerated radiation treatment relatively well. He reported some thoracic pain during treatment, which lessened over the course of treatment. He experienced constipation despite his use of Senokot. The patient also reported a poor appetite. He reported experiencing weakness and fatigue.  Plan: The patient has completed radiation treatment. He was given a prescription for a walker to aid his mobility. The patient will return to radiation oncology clinic for routine followup in one month. I advised him to call or return sooner if he has any questions or concerns related to his recovery or treatment. ________________________________  Sheral Apley. Tammi Klippel, M.D.  This document serves as a record of services personally performed by Tyler Pita, MD. It was created on his behalf by Maryla Morrow, a trained medical scribe. The creation of this record is based on the scribe's personal observations and the provider's statements to them. This document has been checked and approved by the attending provider.

## 2016-05-23 NOTE — Progress Notes (Signed)
Weight loss noted. Heart rate elevated. Reports thoracic spine pain 2 on a scale of 0-10. Reports taking motrin 400 mg prior to treatment plans to take Tylenol later this after noon and before bed oxycontin. Reports a poor appetite. Reports constipation despite use of Senokot and MOM. Denies numbness or tingling of lower extremities. Reports weakness and requesting script for walker. Denies nausea or vomiting.   BP 111/83 (BP Location: Left Arm, Patient Position: Sitting, Cuff Size: Normal)   Pulse (!) 102   Resp 16   Wt 156 lb (70.8 kg)   SpO2 100%   BMI 23.72 kg/m  Wt Readings from Last 3 Encounters:  05/23/16 156 lb (70.8 kg)  05/10/16 162 lb 9.6 oz (73.8 kg)  04/29/16 163 lb 6.4 oz (74.1 kg)

## 2016-05-23 NOTE — Progress Notes (Signed)
  Radiation Oncology         [phone]   Name: Sean Cox MRN: [id number]   Date: 05/23/2016  DOB: 04/03/1933   Weekly Radiation Therapy Management    ICD-9-CM ICD-10-CM   1. 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  Weight loss noted. Heart rate elevated. The patient reports thoracic spine pain 2/10 in severity. He reports taking Motrin 400 mg prior to treatment, and plans to take Tylenol later this afternoon and oxycontin before bed. He reports a poor appetite. Denies nausea or vomiting. Reports constipation despite use of Senokot and MOM. Denies numbness or tingling of lower extremities. He reports weakness and requests a prescription for a walker.   Set-up films were reviewed. The chart was checked.  Physical Findings  weight is 156 lb (70.8 kg). His blood pressure is 111/83 and his pulse is 102 (abnormal). His respiration is 16 and oxygen saturation is 100%. Weight essentially stable. Weight loss of 10 lbs since September 2017.  Impression The patient is tolerating radiation.  Plan Continue treatment as planned. I gave the patient a prescription for a walker to aid in his mobility.         Sheral Apley Tammi Klippel, M.D.  This document serves as a record of services personally performed by Tyler Pita, MD. It was created on his behalf by Maryla Morrow, a trained medical scribe. The creation of this record is based on the scribe's personal observations and the provider's statements to them. This document has been checked and approved by the attending provider.

## 2016-05-27 ENCOUNTER — Telehealth: Payer: Self-pay | Admitting: *Deleted

## 2016-05-27 NOTE — Telephone Encounter (Signed)
FYI  "I need to know who has Hospice information.  Who sets this in motion.  What is Hospice for?  What do they do.  Does Medicare pay for Hospice? Asked if he has any new symptoms or need for Hospice at this time.  "Not yet."  Informed him hospice is discussed with [facility] provider or patient calls Hospice requesting service.  Two levels of service are Palliative and Comfort Care or full Hospice Care for persons with six months or less to live.  Hospice services are covered by all insurance however some medications require self pay of medication and pill coverage.  Hospice nurse is who patient contacts with care needs.  A Hospcie nurse will visit weekly and as symptoms progress visits may increase or Nurse Tech may be called in to help with care.  Hospice has physicians who work in concert with [facility] provider.  Caller ended call.  Next scheduled F/U is 06-11-2016.        Caller's name not provided but identified by number seen with caller ID.

## 2016-05-28 ENCOUNTER — Telehealth: Payer: Self-pay | Admitting: Radiation Oncology

## 2016-05-28 NOTE — Telephone Encounter (Signed)
Return patient's call.

## 2016-05-29 ENCOUNTER — Telehealth: Payer: Self-pay | Admitting: Radiation Oncology

## 2016-05-29 ENCOUNTER — Ambulatory Visit (HOSPITAL_COMMUNITY)
Admission: RE | Admit: 2016-05-29 | Discharge: 2016-05-29 | Disposition: A | Discharge status: Home / Self Care | Payer: Medicare Other | Source: Ambulatory Visit | Attending: Nurse Practitioner | Admitting: Nurse Practitioner

## 2016-05-29 ENCOUNTER — Other Ambulatory Visit (HOSPITAL_BASED_OUTPATIENT_CLINIC_OR_DEPARTMENT_OTHER): Payer: Medicare Other

## 2016-05-29 ENCOUNTER — Other Ambulatory Visit: Payer: Self-pay | Admitting: Nurse Practitioner

## 2016-05-29 ENCOUNTER — Ambulatory Visit (HOSPITAL_BASED_OUTPATIENT_CLINIC_OR_DEPARTMENT_OTHER): Payer: Medicare Other | Admitting: Nurse Practitioner

## 2016-05-29 VITALS — BP 113/76 | HR 105 | Resp 17 | Ht 68.0 in | Wt 152.3 lb

## 2016-05-29 DIAGNOSIS — R634 Abnormal weight loss: Secondary | ICD-10-CM | POA: Diagnosis not present

## 2016-05-29 DIAGNOSIS — K5900 Constipation, unspecified: Secondary | ICD-10-CM

## 2016-05-29 DIAGNOSIS — C3411 Malignant neoplasm of upper lobe, right bronchus or lung: Secondary | ICD-10-CM | POA: Diagnosis not present

## 2016-05-29 DIAGNOSIS — R410 Disorientation, unspecified: Secondary | ICD-10-CM | POA: Diagnosis not present

## 2016-05-29 DIAGNOSIS — E860 Dehydration: Secondary | ICD-10-CM

## 2016-05-29 DIAGNOSIS — R451 Restlessness and agitation: Secondary | ICD-10-CM

## 2016-05-29 LAB — CBC WITH DIFFERENTIAL/PLATELET
BASO%: 0.3 % (ref 0.0–2.0)
BASOS ABS: 0 10*3/uL (ref 0.0–0.1)
EOS ABS: 0 10*3/uL (ref 0.0–0.5)
EOS%: 0 % (ref 0.0–7.0)
HEMATOCRIT: 34.1 % — AB (ref 38.4–49.9)
HEMOGLOBIN: 10.9 g/dL — AB (ref 13.0–17.1)
LYMPH#: 0.5 10*3/uL — AB (ref 0.9–3.3)
LYMPH%: 8.1 % — ABNORMAL LOW (ref 14.0–49.0)
MCH: 27.2 pg (ref 27.2–33.4)
MCHC: 32 g/dL (ref 32.0–36.0)
MCV: 85 fL (ref 79.3–98.0)
MONO#: 0.7 10*3/uL (ref 0.1–0.9)
MONO%: 10.7 % (ref 0.0–14.0)
NEUT#: 5 10*3/uL (ref 1.5–6.5)
NEUT%: 80.9 % — ABNORMAL HIGH (ref 39.0–75.0)
Platelets: 349 10*3/uL (ref 140–400)
RBC: 4.01 10*6/uL — ABNORMAL LOW (ref 4.20–5.82)
RDW: 16.5 % — AB (ref 11.0–14.6)
WBC: 6.2 10*3/uL (ref 4.0–10.3)

## 2016-05-29 LAB — COMPREHENSIVE METABOLIC PANEL
ALBUMIN: 2.8 g/dL — AB (ref 3.5–5.0)
ALK PHOS: 131 U/L (ref 40–150)
ALT: 14 U/L (ref 0–55)
AST: 18 U/L (ref 5–34)
Anion Gap: 11 mEq/L (ref 3–11)
BUN: 22.7 mg/dL (ref 7.0–26.0)
CALCIUM: 9.9 mg/dL (ref 8.4–10.4)
CO2: 23 mEq/L (ref 22–29)
Chloride: 99 mEq/L (ref 98–109)
Creatinine: 0.9 mg/dL (ref 0.7–1.3)
EGFR: 76 mL/min/{1.73_m2} — AB (ref >90)
Glucose: 154 mg/dl — ABNORMAL HIGH (ref 70–140)
POTASSIUM: 4.5 meq/L (ref 3.5–5.1)
Sodium: 133 mEq/L — ABNORMAL LOW (ref 136–145)
Total Bilirubin: 0.49 mg/dL (ref 0.20–1.20)
Total Protein: 7.7 g/dL (ref 6.4–8.3)

## 2016-05-29 MED ORDER — SODIUM CHLORIDE 0.9 % IV SOLN
INTRAVENOUS | Status: DC
  Administered 2016-05-29: 12:00:00 via INTRAVENOUS

## 2016-05-29 MED ORDER — MORPHINE SULFATE 4 MG/ML IJ SOLN
2.0000 mg | Freq: Once | INTRAMUSCULAR | Status: DC
  Filled 2016-05-29: qty 1

## 2016-05-29 MED ORDER — MORPHINE SULFATE (PF) 4 MG/ML IV SOLN
INTRAVENOUS | Status: AC
  Filled 2016-05-29: qty 1

## 2016-05-29 NOTE — Patient Instructions (Signed)
Dehydration, Adult Dehydration is a condition in which there is not enough fluid or water in the body. This happens when you lose more fluids than you take in. Important organs, such as the kidneys, brain, and heart, cannot function without a proper amount of fluids. Any loss of fluids from the body can lead to dehydration. Dehydration can range from mild to severe. This condition should be treated right away to prevent it from becoming severe. What are the causes? This condition may be caused by:  Vomiting.  Diarrhea.  Excessive sweating, such as from heat exposure or exercise.  Not drinking enough fluid, especially:  When ill.  While doing activity that requires a lot of energy.  Excessive urination.  Fever.  Infection.  Certain medicines, such as medicines that cause the body to lose excess fluid (diuretics).  Inability to access safe drinking water.  Reduced physical ability to get adequate water and food. What increases the risk? This condition is more likely to develop in people:  Who have a poorly controlled long-term (chronic) illness, such as diabetes, heart disease, or kidney disease.  Who are age 65 or older.  Who are disabled.  Who live in a place with high altitude.  Who play endurance sports. What are the signs or symptoms? Symptoms of mild dehydration may include:   Thirst.  Dry lips.  Slightly dry mouth.  Dry, warm skin.  Dizziness. Symptoms of moderate dehydration may include:   Very dry mouth.  Muscle cramps.  Dark urine. Urine may be the color of tea.  Decreased urine production.  Decreased tear production.  Heartbeat that is irregular or faster than normal (palpitations).  Headache.  Light-headedness, especially when you stand up from a sitting position.  Fainting (syncope). Symptoms of severe dehydration may include:   Changes in skin, such as:  Cold and clammy skin.  Blotchy (mottled) or pale skin.  Skin that does  not quickly return to normal after being lightly pinched and released (poor skin turgor).  Changes in body fluids, such as:  Extreme thirst.  No tear production.  Inability to sweat when body temperature is high, such as in hot weather.  Very little urine production.  Changes in vital signs, such as:  Weak pulse.  Pulse that is more than 100 beats a minute when sitting still.  Rapid breathing.  Low blood pressure.  Other changes, such as:  Sunken eyes.  Cold hands and feet.  Confusion.  Lack of energy (lethargy).  Difficulty waking up from sleep.  Short-term weight loss.  Unconsciousness. How is this diagnosed? This condition is diagnosed based on your symptoms and a physical exam. Blood and urine tests may be done to help confirm the diagnosis. How is this treated? Treatment for this condition depends on the severity. Mild or moderate dehydration can often be treated at home. Treatment should be started right away. Do not wait until dehydration becomes severe. Severe dehydration is an emergency and it needs to be treated in a hospital. Treatment for mild dehydration may include:   Drinking more fluids.  Replacing salts and minerals in your blood (electrolytes) that you may have lost. Treatment for moderate dehydration may include:   Drinking an oral rehydration solution (ORS). This is a drink that helps you replace fluids and electrolytes (rehydrate). It can be found at pharmacies and retail stores. Treatment for severe dehydration may include:   Receiving fluids through an IV tube.  Receiving an electrolyte solution through a feeding tube that is   passed through your nose and into your stomach (nasogastric tube, or NG tube).  Correcting any abnormalities in electrolytes.  Treating the underlying cause of dehydration. Follow these instructions at home:  If directed by your health care provider, drink an ORS:  Make an ORS by following instructions on the  package.  Start by drinking small amounts, about  cup (120 mL) every 5-10 minutes.  Slowly increase how much you drink until you have taken the amount recommended by your health care provider.  Drink enough clear fluid to keep your urine clear or pale yellow. If you were told to drink an ORS, finish the ORS first, then start slowly drinking other clear fluids. Drink fluids such as:  Water. Do not drink only water. Doing that can lead to having too little salt (sodium) in the body (hyponatremia).  Ice chips.  Fruit juice that you have added water to (diluted fruit juice).  Low-calorie sports drinks.  Avoid:  Alcohol.  Drinks that contain a lot of sugar. These include high-calorie sports drinks, fruit juice that is not diluted, and soda.  Caffeine.  Foods that are greasy or contain a lot of fat or sugar.  Take over-the-counter and prescription medicines only as told by your health care provider.  Do not take sodium tablets. This can lead to having too much sodium in the body (hypernatremia).  Eat foods that contain a healthy balance of electrolytes, such as bananas, oranges, potatoes, tomatoes, and spinach.  Keep all follow-up visits as told by your health care provider. This is important. Contact a health care provider if:  You have abdominal pain that:  Gets worse.  Stays in one area (localizes).  You have a rash.  You have a stiff neck.  You are more irritable than usual.  You are sleepier or more difficult to wake up than usual.  You feel weak or dizzy.  You feel very thirsty.  You have urinated only a small amount of very dark urine over 6-8 hours. Get help right away if:  You have symptoms of severe dehydration.  You cannot drink fluids without vomiting.  Your symptoms get worse with treatment.  You have a fever.  You have a severe headache.  You have vomiting or diarrhea that:  Gets worse.  Does not go away.  You have blood or green matter  (bile) in your vomit.  You have blood in your stool. This may cause stool to look black and tarry.  You have not urinated in 6-8 hours.  You faint.  Your heart rate while sitting still is over 100 beats a minute.  You have trouble breathing. This information is not intended to replace advice given to you by your health care provider. Make sure you discuss any questions you have with your health care provider. Document Released: 05/27/2005 Document Revised: 12/22/2015 Document Reviewed: 07/21/2015 Elsevier Interactive Patient Education  2017 [employer] Inc.  

## 2016-05-29 NOTE — Telephone Encounter (Signed)
Left message with patient's daughter, [name], requesting return call to discuss plan of care for today.

## 2016-05-29 NOTE — Progress Notes (Signed)
@   1300 Pt being transported via w/c to Radiology for Brain MRI per Maudie Mercury, [place].[name] PIV capped and IV fluids to resume upon his return.  TC from MRI @ approx 1320. Pt unable to lie down on MRI table d/t pain in his neck and upper back.  Received order for Morphine 2 mg to be given IV in MRI. Arrived in MRI, pt very upset, almost agitated about having to lie down for MRI, pain etc.  Attempted explanation of pain med via his Iv and that a nurse from [facility] would stay with him. Informed pt that he would have to be on a stretcher for safety reasons.  Ultimately pt refused to have  MRI of brain and requested to go back to [facility] to complete his IV fluids.  Pt understands that it is always his choice to have a test/procedure or not.   Made Selena Lesser, NP aware of cancelled MRI. She, in turn, advised Dr. Julien Nordmann.  Pt completed his IV fluids, reviewed constipation meds with him, diet, fluids etc.  Pt and daughter voiced understanding.

## 2016-05-29 NOTE — Telephone Encounter (Signed)
Provided verbal report to Retta Mac. Cindy request the patient present asap for labs and IV hydration. Phoned patient and requested he present to [facility] as soon as safely possible. Patient verbalized understanding.

## 2016-05-30 ENCOUNTER — Encounter: Payer: Self-pay | Admitting: Nurse Practitioner

## 2016-05-30 DIAGNOSIS — K5900 Constipation, unspecified: Secondary | ICD-10-CM | POA: Insufficient documentation

## 2016-05-30 DIAGNOSIS — R634 Abnormal weight loss: Secondary | ICD-10-CM | POA: Insufficient documentation

## 2016-05-30 DIAGNOSIS — R410 Disorientation, unspecified: Secondary | ICD-10-CM | POA: Insufficient documentation

## 2016-05-30 DIAGNOSIS — E860 Dehydration: Secondary | ICD-10-CM | POA: Insufficient documentation

## 2016-05-30 NOTE — Telephone Encounter (Signed)
Patient reports he is weak, hallucinating constipated and not eating or drinking. Instructed patient to stop Oxy and take Ibuprofen with food instead for pain. Explained he should present to the cancer center first thing tomorrow morning for lab work and hydration. Patient verbalized understanding.

## 2016-05-30 NOTE — Assessment & Plan Note (Signed)
Patient continues undergoing observation only.    Patient was scheduled and sent for a brain MRI today for further evaluation of his intermittent confusion.  However, patient refused the MRI once he arrived in radiology.  He is scheduled for restaging CT on 06/07/2016.  He is scheduled for a follow-up visit to review scan results on 06/11/2016.

## 2016-05-30 NOTE — Assessment & Plan Note (Signed)
.    Patient has lost approximate 18 pounds in the last few months.  Patient states he has minimal to no appetite and  Minimal taste sensation as well.  Patient was encouraged to push fluids is much as possible; to push protein as well.

## 2016-05-30 NOTE — Assessment & Plan Note (Signed)
Patient states that he is constipated.  He was taken some Maalox but has discontinued it at this time.  He was advised to try senna-S stool softener/laxative to see if this helps with his constipation.  He was reminded that he needs to stay on a bowel regimen at all times.

## 2016-05-30 NOTE — Assessment & Plan Note (Signed)
Patient admits to very poor oral intake and poor appetite as well.  He feels dehydrated today.  Sodium was down to 133.  Patient received IV fluid rehydration while at the [facility] today; and to push fluids at home.

## 2016-05-30 NOTE — Progress Notes (Signed)
SYMPTOM MANAGEMENT CLINIC    Chief Complaint: confusion, dehydration  HPI:  Sean Cox 82 y.o. male diagnosed with lung cancer.  Currently undergoing observation only.    No history exists.    Review of Systems  Constitutional: Positive for malaise/fatigue and weight loss.  Neurological: Positive for weakness.  Psychiatric/Behavioral: Positive for hallucinations. The patient is nervous/anxious.   All other systems reviewed and are negative.   Past Medical History:  Diagnosis Date  . Anxiety   . Bone cancer ([place])   . DDD (degenerative disc disease), lumbosacral   . Depression   . Detached retina   . GERD (gastroesophageal reflux disease)    in his younger years  . Glaucoma   . Heart murmur    recently dx'ed with this 4 weeks ago  . Hypercholesteremia   . Hypertension    no longer on medications  . Hypothyroidism   . IBS (irritable bowel syndrome)   . Impaired fasting glucose   . Low back pain 11/07/2015  . Malignant neoplasm of right upper lobe of lung ([place]) 02/14/2015  . Prostate cancer ([place])   . Ulcerative proctitis [facility])     Past Surgical History:  Procedure Laterality Date  . CHEST TUBE INSERTION Right 03/01/2015   Procedure: INSERTION PLEURAL DRAINAGE CATHETER RIGHT CHEST;  Surgeon: [name], MD;  Location: [place];  Service: Thoracic;  Laterality: Right;  . COLON SURGERY    . diverticular stricture removed     1992  . fatty tumor excision     in chest  . PROSTATECTOMY     2004- Dr Risa Grill  . REMOVAL OF PLEURAL DRAINAGE CATHETER Right 04/26/2015   Procedure: REMOVAL OF PLEURAL DRAINAGE CATHETER;  Surgeon: [name], MD;  Location: [place];  Service: Thoracic;  Laterality: Right;  . TALC PLEURODESIS Right 04/26/2015   Procedure: Pietro Cassis;  Surgeon: [name], MD;  Location: [place];  Service: Thoracic;  Laterality: Right;  . TONSILLECTOMY      has Pleural effusion on right; Ascending aortic aneurysm ([place]); Malignant  neoplasm of right upper lobe of lung ([place]); Encounter for antineoplastic chemotherapy; Excessive lacrimation; Agitation; Encounter for antineoplastic immunotherapy; Spine metastasis ([place]); Confusion; Constipation; Dehydration; and Unintentional weight loss on his problem list.    is allergic to codeine; imuran [azathioprine]; remicade [infliximab]; simvastatin; and sulfa antibiotics.  Allergies as of 05/29/2016      Reactions   Codeine Nausea Only   Nausea   Imuran [azathioprine] Other (See Comments)   Abnormal liver functions Pt unfamiliar with medication and would like to know who put that medication in.   Remicade [infliximab] Other (See Comments)   Muscle cramps   Simvastatin Other (See Comments)   Headaches   Sulfa Antibiotics Other (See Comments)   High fever      Medication List       Accurate as of 05/29/16 11:59 PM. Always use your most recent med list.          acetaminophen 500 MG tablet Commonly known as:  TYLENOL Take 1,000 mg by mouth every 4 (four) hours as needed for mild pain, moderate pain, fever or headache.   APRISO 0.375 g 24 hr capsule Generic drug:  mesalamine Take 8 capsules by mouth daily.   atorvastatin 20 MG tablet Commonly known as:  LIPITOR Take 20 mg by mouth daily.   dorzolamide 2 % ophthalmic solution Commonly known as:  TRUSOPT Place 1 drop into both eyes 3 (three) times  daily.   folic acid 1 MG tablet Commonly known as:  FOLVITE Take 1 tablet (1 mg total) by mouth daily.   ibuprofen 400 MG tablet Commonly known as:  ADVIL,MOTRIN Take 400 mg by mouth every 6 (six) hours as needed.   levothyroxine 25 MCG tablet Commonly known as:  SYNTHROID, LEVOTHROID Take 25 mcg by mouth daily.   oxyCODONE 5 MG immediate release tablet Commonly known as:  Oxy IR/ROXICODONE Take 1 tablet (5 mg total) by mouth every 6 (six) hours as needed for severe pain.   PARoxetine 40 MG tablet Commonly known as:  PAXIL TAKE 1 TABLET BY MOUTH DAILY     prochlorperazine 10 MG tablet Commonly known as:  COMPAZINE Take 1 tablet (10 mg total) by mouth every 6 (six) hours as needed for nausea or vomiting.   senna 8.6 MG tablet Commonly known as:  SENOKOT Take 1 tablet by mouth daily as needed for constipation. Reported on 12/15/2015   timolol 0.5 % ophthalmic solution Commonly known as:  TIMOPTIC Place 1 drop into both eyes 3 (three) times daily. Reported on 08/30/2015        PHYSICAL EXAMINATION  Oncology Vitals 05/29/2016 05/23/2016  Height 173 cm -  Weight 69.083 kg 70.761 kg  Weight (lbs) 152 lbs 5 oz 156 lbs  BMI (kg/m2) 23.16 kg/m2 23.72 kg/m2  Temp - -  Pulse 105 102  Resp 17 16  SpO2 95 100  BSA (m2) 1.82 m2 1.84 m2   BP Readings from Last 2 Encounters:  05/29/16 113/76  05/23/16 111/83    Physical Exam  Constitutional: Vital signs are normal. He appears malnourished and dehydrated. He appears unhealthy. He appears cachectic.  HENT:  Head: Normocephalic and atraumatic.  Mouth/Throat: Oropharynx is clear and moist.  Eyes: Conjunctivae and EOM are normal. Pupils are equal, round, and reactive to light. Right eye exhibits no discharge. Left eye exhibits no discharge. No scleral icterus.  Neck: Normal range of motion. Neck supple. No JVD present. No tracheal deviation present. No thyromegaly present.  Cardiovascular: Normal rate, regular rhythm, normal heart sounds and intact distal pulses.   Pulmonary/Chest: Effort normal and breath sounds normal. No respiratory distress. He has no wheezes. He has no rales. He exhibits no tenderness.  Abdominal: Soft. Bowel sounds are normal. He exhibits no distension and no mass. There is no tenderness. There is no rebound and no guarding.  Musculoskeletal: Normal range of motion. He exhibits no edema, tenderness or deformity.  Lymphadenopathy:    He has no cervical adenopathy.  Neurological: He is alert.  Patient appeared mildly agitated and confused at times.  See further notes for  details.  Skin: Skin is warm and dry. No rash noted. No erythema. There is pallor.  Psychiatric: Affect normal.  Nursing note and vitals reviewed.   LABORATORY DATA:. Appointment on 05/29/2016  Component Date Value Ref Range Status  . WBC 05/29/2016 6.2  4.0 - 10.3 10e3/uL Final  . NEUT# 05/29/2016 5.0  1.5 - 6.5 10e3/uL Final  . HGB 05/29/2016 10.9* 13.0 - 17.1 g/dL Final  . HCT 34/75/8307 34.1* 38.4 - 49.9 % Final  . Platelets 05/29/2016 349  140 - 400 10e3/uL Final  . MCV 05/29/2016 85.0  79.3 - 98.0 fL Final  . MCH 05/29/2016 27.2  27.2 - 33.4 pg Final  . MCHC 05/29/2016 32.0  32.0 - 36.0 g/dL Final  . RBC 46/00/2984 4.01* 4.20 - 5.82 10e6/uL Final  . RDW 05/29/2016 16.5* 11.0 - 14.6 %  Final  . lymph# 05/29/2016 0.5* 0.9 - 3.3 10e3/uL Final  . MONO# 05/29/2016 0.7  0.1 - 0.9 10e3/uL Final  . Eosinophils Absolute 05/29/2016 0.0  0.0 - 0.5 10e3/uL Final  . Basophils Absolute 05/29/2016 0.0  0.0 - 0.1 10e3/uL Final  . NEUT% 05/29/2016 80.9* 39.0 - 75.0 % Final  . LYMPH% 05/29/2016 8.1* 14.0 - 49.0 % Final  . MONO% 05/29/2016 10.7  0.0 - 14.0 % Final  . EOS% 05/29/2016 0.0  0.0 - 7.0 % Final  . BASO% 05/29/2016 0.3  0.0 - 2.0 % Final  . Sodium 05/29/2016 133* 136 - 145 mEq/L Final  . Potassium 05/29/2016 4.5  3.5 - 5.1 mEq/L Final  . Chloride 05/29/2016 99  98 - 109 mEq/L Final  . CO2 05/29/2016 23  22 - 29 mEq/L Final  . Glucose 05/29/2016 154* 70 - 140 mg/dl Final  . BUN 05/29/2016 22.7  7.0 - 26.0 mg/dL Final  . Creatinine 05/29/2016 0.9  0.7 - 1.3 mg/dL Final  . Total Bilirubin 05/29/2016 0.49  0.20 - 1.20 mg/dL Final  . Alkaline Phosphatase 05/29/2016 131  40 - 150 U/L Final  . AST 05/29/2016 18  5 - 34 U/L Final  . ALT 05/29/2016 14  0 - 55 U/L Final  . Total Protein 05/29/2016 7.7  6.4 - 8.3 g/dL Final  . Albumin 05/29/2016 2.8* 3.5 - 5.0 g/dL Final  . Calcium 05/29/2016 9.9  8.4 - 10.4 mg/dL Final  . Anion Gap 05/29/2016 11  3 - 11 mEq/L Final  . EGFR 05/29/2016 76*  >90 ml/min/1.73 m2 Final    RADIOGRAPHIC STUDIES: No results found.  ASSESSMENT/PLAN:    Unintentional weight loss .  Patient has lost approximate 18 pounds in the last few months.  Patient states he has minimal to no appetite and  Minimal taste sensation as well.  Patient was encouraged to push fluids is much as possible; to push protein as well.  Malignant neoplasm of right upper lobe of lung [facility])   Patient continues undergoing observation only.    Patient was scheduled and sent for a brain MRI today for further evaluation of his intermittent confusion.  However, patient refused the MRI once he arrived in radiology.  He is scheduled for restaging CT on 06/07/2016.  He is scheduled for a follow-up visit to review scan results on 06/11/2016.  Dehydration   Patient admits to very poor oral intake and poor appetite as well.  He feels dehydrated today.  Sodium was down to 133.  Patient received IV fluid rehydration while at the [facility] today; and to push fluids at home.  Constipation   Patient states that he is constipated.  He was taken some Maalox but has discontinued it at this time.  He was advised to try senna-S stool softener/laxative to see if this helps with his constipation.  He was reminded that he needs to stay on a bowel regimen at all times.  Confusion   Patient's daughter called the [facility].  Reportedly the patient had episodes of increased confusion.  He stated that he was hallucinating at times as well.   he has had some hallucinations; stating that the TV was off and he would be able to see the newscaster's discussing the news anyway.  He also discussed flying his a and then driving his sports car; but then admitted that he had neither.  On exam today-patient with at times ramble incessantly; and at other times he was very  lucid.  He appeared somewhat agitated during most of the exam.  He would follow all commands andrespond appropriately at times as well.   Patient's daughter stated that she was concerned that patient take an oxycodone was causing this confusion.  However, patient states that he is not taking any oxycodone in the last several days.  He states the only pain medication.  He is taking recently is ibuprofen.  Reviewed all findings with Dr. Julien Nordmann; he recommended that patient undergo a brain MRI for further evaluation.  Brain MRI was ordered; and patient was transported to radiology for the brain MRI.  However, patient stated that his back hurt lying on the hard table; and he refused to proceed with the MRI.  This provider ordered morphine IV for the patient to make him more comfortable while undergoing the MRI; the patient continued to refuse.  Dr. Julien Nordmann aware the patient refused MRI today.    Increased confusion could be secondary to dehydration.  Patient and his daughter were  Advised to go directly to the emergency department for any worsening symptoms whatsoever.   Patient stated understanding of all instructions; and was in agreement with this plan of care. The patient knows to call the clinic with any problems, questions or concerns.   Total time spent with patient was 40 minutes;  with greater than 75 percent of that time spent in face to face counseling regarding patient's symptoms,  and coordination of care and follow up.  Disclaimer:This dictation was prepared with Dragon/digital dictation along with [employer]. Any transcriptional errors that result from this process are unintentional.  Drue Second, NP 05/30/2016

## 2016-05-30 NOTE — Assessment & Plan Note (Addendum)
Patient's daughter called the [facility].  Reportedly the patient had episodes of increased confusion.  He stated that he was hallucinating at times as well.   he has had some hallucinations; stating that the TV was off and he would be able to see the newscaster's discussing the news anyway.  He also discussed flying his a and then driving his sports car; but then admitted that he had neither.  On exam today-patient with at times ramble incessantly; and at other times he was very lucid.  He appeared somewhat agitated during most of the exam.  He would follow all commands andrespond appropriately at times as well.  Patient's daughter stated that she was concerned that patient take an oxycodone was causing this confusion.  However, patient states that he is not taking any oxycodone in the last several days.  He states the only pain medication.  He is taking recently is ibuprofen.  Reviewed all findings with Dr. Julien Nordmann; he recommended that patient undergo a brain MRI for further evaluation.  Brain MRI was ordered; and patient was transported to radiology for the brain MRI.  However, patient stated that his back hurt lying on the hard table; and he refused to proceed with the MRI.  This provider ordered morphine IV for the patient to make him more comfortable while undergoing the MRI; the patient continued to refuse.  Dr. Julien Nordmann aware the patient refused MRI today.    Increased confusion could be secondary to dehydration.  Patient and his daughter were  Advised to go directly to the emergency department for any worsening symptoms whatsoever.

## 2016-06-04 ENCOUNTER — Other Ambulatory Visit: Payer: Self-pay | Admitting: Internal Medicine

## 2016-06-04 ENCOUNTER — Other Ambulatory Visit: Payer: Medicare Other

## 2016-06-04 ENCOUNTER — Other Ambulatory Visit: Payer: Self-pay | Admitting: *Deleted

## 2016-06-04 ENCOUNTER — Telehealth: Payer: Self-pay | Admitting: *Deleted

## 2016-06-04 MED ORDER — PROCHLORPERAZINE MALEATE 10 MG PO TABS: 10.0000 mg | ORAL_TABLET | Freq: Four times a day (QID) | ORAL | 1 refills | Status: AC | PRN

## 2016-06-04 NOTE — Telephone Encounter (Signed)
Daughter joann calling for a refill on compazine to [facility] @ [address]. Refilled script

## 2016-06-07 ENCOUNTER — Ambulatory Visit (HOSPITAL_COMMUNITY)
Admission: RE | Admit: 2016-06-07 | Discharge: 2016-06-07 | Disposition: A | Discharge status: Home / Self Care | Payer: Medicare Other | Source: Ambulatory Visit | Attending: Internal Medicine | Admitting: Internal Medicine

## 2016-06-07 DIAGNOSIS — C7951 Secondary malignant neoplasm of bone: Secondary | ICD-10-CM | POA: Diagnosis not present

## 2016-06-07 DIAGNOSIS — C7972 Secondary malignant neoplasm of left adrenal gland: Secondary | ICD-10-CM | POA: Insufficient documentation

## 2016-06-07 DIAGNOSIS — C3411 Malignant neoplasm of upper lobe, right bronchus or lung: Secondary | ICD-10-CM

## 2016-06-07 MED ORDER — IOPAMIDOL (ISOVUE-300) INJECTION 61%
INTRAVENOUS | Status: AC
  Filled 2016-06-07: qty 100

## 2016-06-07 MED ORDER — IOPAMIDOL (ISOVUE-300) INJECTION 61%
100.0000 mL | Freq: Once | INTRAVENOUS | Status: AC | PRN
  Administered 2016-06-07: 100 mL via INTRAVENOUS

## 2016-06-11 ENCOUNTER — Telehealth: Payer: Self-pay | Admitting: Medical Oncology

## 2016-06-11 ENCOUNTER — Ambulatory Visit (HOSPITAL_BASED_OUTPATIENT_CLINIC_OR_DEPARTMENT_OTHER): Payer: PPO | Admitting: Internal Medicine

## 2016-06-11 ENCOUNTER — Encounter: Payer: Self-pay | Admitting: Internal Medicine

## 2016-06-11 ENCOUNTER — Other Ambulatory Visit: Payer: Medicare Other

## 2016-06-11 DIAGNOSIS — R634 Abnormal weight loss: Secondary | ICD-10-CM

## 2016-06-11 DIAGNOSIS — C7951 Secondary malignant neoplasm of bone: Secondary | ICD-10-CM

## 2016-06-11 DIAGNOSIS — K5900 Constipation, unspecified: Secondary | ICD-10-CM

## 2016-06-11 DIAGNOSIS — C782 Secondary malignant neoplasm of pleura: Secondary | ICD-10-CM | POA: Diagnosis not present

## 2016-06-11 DIAGNOSIS — C7972 Secondary malignant neoplasm of left adrenal gland: Secondary | ICD-10-CM

## 2016-06-11 DIAGNOSIS — C3411 Malignant neoplasm of upper lobe, right bronchus or lung: Secondary | ICD-10-CM

## 2016-06-11 DIAGNOSIS — C772 Secondary and unspecified malignant neoplasm of intra-abdominal lymph nodes: Secondary | ICD-10-CM

## 2016-06-11 DIAGNOSIS — Z5111 Encounter for antineoplastic chemotherapy: Secondary | ICD-10-CM

## 2016-06-11 MED ORDER — OXYCODONE HCL 5 MG PO TABS: 10.0000 mg | ORAL_TABLET | ORAL | 0 refills | Status: AC | PRN

## 2016-06-11 MED ORDER — OXYCODONE HCL 5 MG PO TABS: 5.0000 mg | ORAL_TABLET | Freq: Four times a day (QID) | ORAL | 0 refills | Status: AC | PRN

## 2016-06-11 MED ORDER — METHYLPREDNISOLONE 4 MG PO TBPK
ORAL_TABLET | ORAL | 0 refills | Status: AC
Start: 2016-06-11 — End: ?

## 2016-06-11 NOTE — Progress Notes (Signed)
[facility] [phone]   [phone]  OFFICE PROGRESS NOTE  [name], MD [place] [address] [place] [address]  DIAGNOSIS: Stage IV (T2b, N2, M1b) non-small cell lung cancer, adenocarcinoma with negative EGFR mutation presented with right upper lobe lung mass, pleural-based metastasis, mediastinal lymphadenopathy as well as metastatic disease to the left adrenal gland, bones and abdominal lymph nodes diagnosed in August 2016.  PRIOR THERAPY: 1) Status post right Pleurx catheter placement under the care of [name] on 03/01/2015. 2) Systemic chemotherapy with carboplatin for AUC of 5 and Alimta 500 MG/M2. First cycle on 03/14/2015. Status post 5 cycles, discontinued secondary to disease progression.  3) Second line treatment with immunotherapy with Nivolumab 240 MG IV every 2 weeks, first dose 08/15/2015. Status post 8 cycles. Last dose was given 11/21/2015, discontinued secondary to disease progression. 4) Palliative radiotherapy to the right upper lobe lung mass in addition to the mediastinal lymphadenopathy and T11 metastatic bone lesion under the care of Dr. Tammi Klippel completed 12/26/2015. 5) Systemic chemotherapy with Alimta 500 MG/M2 every 3 weeks. First dose 01/30/2016. Treatment is currently on hold secondary to intolerance.  CURRENT THERAPY: Hospice and palliative care.  INTERVAL HISTORY: Sean Cox 83 y.o. male returns to the clinic today for follow-up visit accompanied by his wife and daughter. The patient is complaining of increasing fatigue and weakness as well as persistent back pain. He is currently on oxycodone 1-2 tablets every 3 hours with minimal improvement in his pain. He has been observation for the last few months. He could not tolerate previous systemic chemotherapy. He also has a lot of weight loss recently. He denied having any chest pain but has shortness of breath with exertion was no cough or  hemoptysis. He denied having any fever or chills. He has no nausea, vomiting, diarrhea but has constipation has been using stool softener with mild improvement. He had repeat CT scan of the chest, abdomen and pelvis performed recently and he is here for evaluation and discussion of his scan results and treatment options.  MEDICAL HISTORY: Past Medical History:  Diagnosis Date  . Anxiety   . Bone cancer ([facility])   . DDD (degenerative disc disease), lumbosacral   . Depression   . Detached retina   . GERD (gastroesophageal reflux disease)    in his younger years  . Glaucoma   . Heart murmur    recently dx'ed with this 4 weeks ago  . Hypercholesteremia   . Hypertension    no longer on medications  . Hypothyroidism   . IBS (irritable bowel syndrome)   . Impaired fasting glucose   . Low back pain 11/07/2015  . Malignant neoplasm of right upper lobe of lung ([facility]) 02/14/2015  . Prostate cancer ([facility])   . 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  MEDICATIONS:  Current Outpatient Prescriptions  Medication Sig Dispense Refill  . acetaminophen (TYLENOL) 500 MG tablet Take 1,000 mg by mouth every 4 (four) hours as needed for mild pain, moderate pain, fever or headache.    . APRISO 0.375 G 24 hr capsule Take 8 capsules by mouth daily.   11  . atorvastatin (LIPITOR) 20 MG tablet Take 20 mg by mouth daily.   1  . dorzolamide (TRUSOPT) 2 % ophthalmic solution Place 1 drop into both eyes 3 (three) times daily.  3  . folic  acid (FOLVITE) 1 MG tablet Take 1 tablet (1 mg total) by mouth daily. 30 tablet 4  . ibuprofen (ADVIL,MOTRIN) 400 MG tablet Take 400 mg by mouth every 6 (six) hours as needed.    [name] levothyroxine (SYNTHROID, LEVOTHROID) 25 MCG tablet Take 25 mcg by mouth daily.   1  . oxyCODONE (OXY IR/ROXICODONE) 5 MG immediate release tablet Take 1 tablet (5 mg total) by mouth every 6 (six)  hours as needed for severe pain. (Patient not taking: Reported on 05/29/2016) 60 tablet 0  . PARoxetine (PAXIL) 40 MG tablet TAKE 1 TABLET BY MOUTH DAILY  3  . prochlorperazine (COMPAZINE) 10 MG tablet Take 1 tablet (10 mg total) by mouth every 6 (six) hours as needed for nausea or vomiting. 30 tablet 1  . senna (SENOKOT) 8.6 MG tablet Take 1 tablet by mouth daily as needed for constipation. Reported on 12/15/2015    . timolol (TIMOPTIC) 0.5 % ophthalmic solution Place 1 drop into both eyes 3 (three) times daily. Reported on 08/30/2015  3   No current facility-administered medications for this visit.     SURGICAL HISTORY:  Past Surgical History:  Procedure Laterality Date  . CHEST TUBE INSERTION Right 03/01/2015   Procedure: INSERTION PLEURAL DRAINAGE CATHETER RIGHT CHEST;  Surgeon: [name], MD;  Location: [facility];  Service: Thoracic;  Laterality: Right;  . COLON SURGERY    . diverticular stricture removed     1992  . fatty tumor excision     in chest  . PROSTATECTOMY     2004- Dr Risa Grill  . REMOVAL OF PLEURAL DRAINAGE CATHETER Right 04/26/2015   Procedure: REMOVAL OF PLEURAL DRAINAGE CATHETER;  Surgeon: [name], MD;  Location: [facility];  Service: Thoracic;  Laterality: Right;  . TALC PLEURODESIS Right 04/26/2015   Procedure: Pietro Cassis;  Surgeon: [name], MD;  Location: [facility];  Service: Thoracic;  Laterality: Right;  . TONSILLECTOMY      REVIEW OF SYSTEMS:  Constitutional: positive for anorexia, fatigue and weight loss Eyes: negative Ears, nose, mouth, throat, and face: negative Respiratory: positive for cough and dyspnea on exertion Cardiovascular: negative Gastrointestinal: positive for constipation Genitourinary:negative Integument/breast: negative Hematologic/lymphatic: negative Musculoskeletal:positive for back pain and muscle weakness Neurological: negative Behavioral/Psych: negative Endocrine: negative Allergic/Immunologic: negative     PHYSICAL EXAMINATION: General appearance: alert, cooperative, fatigued and no distress Head: Normocephalic, without obvious abnormality, atraumatic Neck: no adenopathy, no JVD, supple, symmetrical, trachea midline and thyroid not enlarged, symmetric, no tenderness/mass/nodules Lymph nodes: Cervical, supraclavicular, and axillary nodes normal. Resp: clear to auscultation bilaterally Back: symmetric, no curvature. ROM normal. No CVA tenderness. Cardio: regular rate and rhythm, S1, S2 normal, no murmur, click, rub or gallop GI: soft, non-tender; bowel sounds normal; no masses,  no organomegaly Extremities: extremities normal, atraumatic, no cyanosis or edema Neurologic: Alert and oriented X 3, normal strength and tone. Normal symmetric reflexes. Normal coordination and gait  ECOG PERFORMANCE STATUS: 2 - Symptomatic, <50% confined to bed  There were no vitals taken for this visit.  LABORATORY DATA: Lab Results  Component Value Date   WBC 6.2 05/29/2016   HGB 10.9 (L) 05/29/2016   HCT 34.1 (L) 05/29/2016   MCV 85.0 05/29/2016   PLT 349 05/29/2016      Chemistry      Component Value Date/Time   NA 133 (L) 05/29/2016 1036   K 4.5 05/29/2016 1036   CL 100 (L) 05/23/2015 1725   CO2 23 05/29/2016 1036  BUN 22.7 05/29/2016 1036   CREATININE 0.9 05/29/2016 1036      Component Value Date/Time   CALCIUM 9.9 05/29/2016 1036   ALKPHOS 131 05/29/2016 1036   AST 18 05/29/2016 1036   ALT 14 05/29/2016 1036   BILITOT 0.49 05/29/2016 1036       RADIOGRAPHIC STUDIES: Ct Chest W Contrast  Result Date: 06/07/2016 CLINICAL DATA:  Restaging lung cancer. Initial diagnosis 2016. Remote history of prostate cancer. EXAM: CT CHEST, ABDOMEN, AND PELVIS WITH CONTRAST TECHNIQUE: Multidetector CT imaging of the chest, abdomen and pelvis was performed following the standard protocol during bolus administration of intravenous contrast. CONTRAST:  169m ISOVUE-300 IOPAMIDOL (ISOVUE-300) INJECTION 61%  COMPARISON:  CT scan 04/02/2016. FINDINGS: CT CHEST FINDINGS Chest wall: No chest wall mass, supraclavicular or axillary lymphadenopathy. Small scattered lymph nodes are stable. The thyroid gland is grossly normal. Cardiovascular: The heart is normal in size. No pericardial effusion. Small amount of pericardial fluid. Stable tortuosity, ectasia and minimal scattered calcifications involving the thoracic aorta. No dissection. The branch vessels are patent. Stable coronary artery calcifications. Mediastinum/Nodes: Small mediastinal and right hilar lymph nodes are relatively stable. 11 mm lymph node adjacent to the right mainstem bronchus on image 28 is unchanged. Slightly more inferiorly there is an 11 mm node near the right pulmonary artery on image 29 which measured 8 mm on the prior study. 6 mm lymph node on image 31 is stable and 4 mm subcarinal node on image 31 is stable. Lungs/Pleura: The right upper lobe lung mass measures 38 x 38 mm on image number 40 and previously measured 47 x 39 mm. There are new pulmonary nodules in both lungs consistent with new metastatic disease. 6 mm left upper lobe pulmonary nodule on image number 45. There is also a new 4 mm nodule in the superior segment of the right lower lobe on image number 45. 10 mm right lower lobe pulmonary nodule on image number 70. Stable 6 mm left lower lobe pulmonary nodule on image number 83. Stable 4.5 mm nodule in the right lower lobe on image number 88. New 8 mm left lower lobe Lobe pulmonary nodule on image number 99. Persistent right pleural effusion with probable loculation and overlying atelectasis. Musculoskeletal: Progressive metastatic bone disease. There are several new sclerotic rib lesions and a large destructive lesion involving the right eleventh rib posteriorly with a large soft tissue component measuring 3.2 x 2.9 cm. This was not present on the prior examination. CT ABDOMEN PELVIS FINDINGS Hepatobiliary: No focal hepatic lesions to  suggest hepatic metastatic disease. Geographic fatty infiltration is noted. The portal and splenic veins are patent. Stable calcified granuloma. Pancreas: No mass, inflammation or ductal dilatation. Spleen: Normal size.  No focal lesions. Adrenals/Urinary Tract: Enlarging left adrenal gland metastasis. It measures 2.7 x 2.8 cm and previously measured 2.4 x 2.2 cm. The right adrenal gland metastasis appears relatively stable. The kidneys are unremarkable.  No bladder lesions. Stomach/Bowel: The stomach, duodenum, small bowel and colon are grossly normal. No inflammatory changes, mass lesions or obstructive findings. Vascular/Lymphatic: Stable atherosclerotic calcifications involving the aorta along with tortuosity and ectasia. No dissection. Branch vessel calcifications are stable. Enlarging celiac axis lymph nodes are noted. The largest node on image number 56 measures 26.5 x 21 mm and previously measured 19 x 18 mm. Right-sided retroperitoneal lymph node just posterior to the right renal vein is stable. Reproductive: Surgically absent prostate gland and seminal vesicles. Other: No pelvic mass or adenopathy. No free pelvic fluid collections.  No inguinal mass or adenopathy. Musculoskeletal: Progressive metastatic bone disease involving the pelvis. Lytic lesion in left iliac bone measures a maximum of 25 mm and previously measured 18 mm. There is a new pathologic fracture involving the left iliac bone on image number 97. IMPRESSION: 1. Progressive pulmonary metastatic disease with new pulmonary nodules. 2. Interval decrease in size of the right upper lobe lung mass. 3. Stable mediastinal and hilar lymph nodes. 4. Enlarging left adrenal gland metastasis and enlarging celiac axis nodal metastasis. 5. Progressive metastatic bone disease with multiple new rib lesions. There is a large soft tissue mass associated with the right eleventh rib lesion which is new. 6. New pathologic fracture involving the left iliac bone. 7.  Persistent loculated right pleural fluid collection with vague pleural nodules worrisome for metastasis. Electronically Signed   By: Marijo Sanes M.D.   On: 06/07/2016 16:46   Ct Abdomen Pelvis W Contrast  Result Date: 06/07/2016 CLINICAL DATA:  Restaging lung cancer. Initial diagnosis 2016. Remote history of prostate cancer. EXAM: CT CHEST, ABDOMEN, AND PELVIS WITH CONTRAST TECHNIQUE: Multidetector CT imaging of the chest, abdomen and pelvis was performed following the standard protocol during bolus administration of intravenous contrast. CONTRAST:  180m ISOVUE-300 IOPAMIDOL (ISOVUE-300) INJECTION 61% COMPARISON:  CT scan 04/02/2016. FINDINGS: CT CHEST FINDINGS Chest wall: No chest wall mass, supraclavicular or axillary lymphadenopathy. Small scattered lymph nodes are stable. The thyroid gland is grossly normal. Cardiovascular: The heart is normal in size. No pericardial effusion. Small amount of pericardial fluid. Stable tortuosity, ectasia and minimal scattered calcifications involving the thoracic aorta. No dissection. The branch vessels are patent. Stable coronary artery calcifications. Mediastinum/Nodes: Small mediastinal and right hilar lymph nodes are relatively stable. 11 mm lymph node adjacent to the right mainstem bronchus on image 28 is unchanged. Slightly more inferiorly there is an 11 mm node near the right pulmonary artery on image 29 which measured 8 mm on the prior study. 6 mm lymph node on image 31 is stable and 4 mm subcarinal node on image 31 is stable. Lungs/Pleura: The right upper lobe lung mass measures 38 x 38 mm on image number 40 and previously measured 47 x 39 mm. There are new pulmonary nodules in both lungs consistent with new metastatic disease. 6 mm left upper lobe pulmonary nodule on image number 45. There is also a new 4 mm nodule in the superior segment of the right lower lobe on image number 45. 10 mm right lower lobe pulmonary nodule on image number 70. Stable 6 mm left  lower lobe pulmonary nodule on image number 83. Stable 4.5 mm nodule in the right lower lobe on image number 88. New 8 mm left lower lobe Lobe pulmonary nodule on image number 99. Persistent right pleural effusion with probable loculation and overlying atelectasis. Musculoskeletal: Progressive metastatic bone disease. There are several new sclerotic rib lesions and a large destructive lesion involving the right eleventh rib posteriorly with a large soft tissue component measuring 3.2 x 2.9 cm. This was not present on the prior examination. CT ABDOMEN PELVIS FINDINGS Hepatobiliary: No focal hepatic lesions to suggest hepatic metastatic disease. Geographic fatty infiltration is noted. The portal and splenic veins are patent. Stable calcified granuloma. Pancreas: No mass, inflammation or ductal dilatation. Spleen: Normal size.  No focal lesions. Adrenals/Urinary Tract: Enlarging left adrenal gland metastasis. It measures 2.7 x 2.8 cm and previously measured 2.4 x 2.2 cm. The right adrenal gland metastasis appears relatively stable. The kidneys are unremarkable.  No bladder  lesions. Stomach/Bowel: The stomach, duodenum, small bowel and colon are grossly normal. No inflammatory changes, mass lesions or obstructive findings. Vascular/Lymphatic: Stable atherosclerotic calcifications involving the aorta along with tortuosity and ectasia. No dissection. Branch vessel calcifications are stable. Enlarging celiac axis lymph nodes are noted. The largest node on image number 56 measures 26.5 x 21 mm and previously measured 19 x 18 mm. Right-sided retroperitoneal lymph node just posterior to the right renal vein is stable. Reproductive: Surgically absent prostate gland and seminal vesicles. Other: No pelvic mass or adenopathy. No free pelvic fluid collections. No inguinal mass or adenopathy. Musculoskeletal: Progressive metastatic bone disease involving the pelvis. Lytic lesion in left iliac bone measures a maximum of 25 mm and  previously measured 18 mm. There is a new pathologic fracture involving the left iliac bone on image number 97. IMPRESSION: 1. Progressive pulmonary metastatic disease with new pulmonary nodules. 2. Interval decrease in size of the right upper lobe lung mass. 3. Stable mediastinal and hilar lymph nodes. 4. Enlarging left adrenal gland metastasis and enlarging celiac axis nodal metastasis. 5. Progressive metastatic bone disease with multiple new rib lesions. There is a large soft tissue mass associated with the right eleventh rib lesion which is new. 6. New pathologic fracture involving the left iliac bone. 7. Persistent loculated right pleural fluid collection with vague pleural nodules worrisome for metastasis. Electronically Signed   By: Marijo Sanes M.D.   On: 06/07/2016 16:46    ASSESSMENT AND PLAN: This is a very pleasant 83 years old white male with metastatic non-small cell lung cancer, adenocarcinoma diagnosed in August 2016 status post multiple systemic chemotherapy regimens as well as palliative radiation. He has been on observation recently. Unfortunately the recent CT scan of the chest, abdomen and pelvis showed significant disease progression. I personally and independently reviewed the scan images and discuss the results with the patient and his family. He has rough time tolerating his previous systemic chemotherapy and his performance status is poor. I had a lengthy discussion with the patient about his treatment options. I strongly recommended for him to consider palliative care and hospice at this point. I also gave him the option of treatment with single agent chemotherapy but the patient and his family agreed with the hospice referral. We will refer him to the hospice service of [facility]. For the severe back pain, I gave the patient refill of oxycodone 10 mg by mouth every 4 hours as needed. For the lack of appetite and weight loss, I started the patient on Medrol Dosepak. For  constipation, advised the patient to start MiraLAX and milk of magnesia if needed The patient his family agreed to the current plan. I would see him on as-needed basis at this point. He was advised to call immediately if he has any concerning symptoms. The patient voices understanding of current disease status and treatment options and is in agreement with the current care plan.  All questions were answered. The patient knows to call the clinic with any problems, questions or concerns. We can certainly see the patient much sooner if necessary.  Disclaimer: This note was dictated with voice recognition software. Similar sounding words can inadvertently be transcribed and may not be corrected upon review.

## 2016-06-11 NOTE — Telephone Encounter (Signed)
Referral made to hospice.

## 2016-06-14 ENCOUNTER — Ambulatory Visit (HOSPITAL_COMMUNITY): Payer: Medicare Other

## 2016-06-18 ENCOUNTER — Ambulatory Visit: Payer: Medicare Other | Admitting: Internal Medicine

## 2016-07-01 ENCOUNTER — Telehealth: Payer: Self-pay

## 2016-07-01 NOTE — Telephone Encounter (Signed)
Abigail Butts RN with hospice called requesting VO for placement to [facility]. Family is requesting this change. Called back and left voice message with VO given per Dr Julien Nordmann.

## 2016-07-04 ENCOUNTER — Telehealth: Payer: Self-pay | Admitting: *Deleted

## 2016-07-09 ENCOUNTER — Ambulatory Visit: Admission: RE | Admit: 2016-07-09 | Payer: PPO | Source: Ambulatory Visit | Admitting: Radiation Oncology

## 2016-07-11 NOTE — Telephone Encounter (Signed)
Fax from Hospice. Pt passed 07/19/16 at 0413.

## 2016-07-11 DEATH — deceased

## 2016-09-03 IMAGING — CR DG CHEST 2V
2 series · 2 of 2 positions shown · non-contrast
Comparison: Chest x-ray 02/16/2015. PET-CT 02/16/2015. Chest CT
02/06/2015.

CLINICAL DATA: Adenocarcinoma.

EXAM:
CHEST  2 VIEW

[w chest pa]
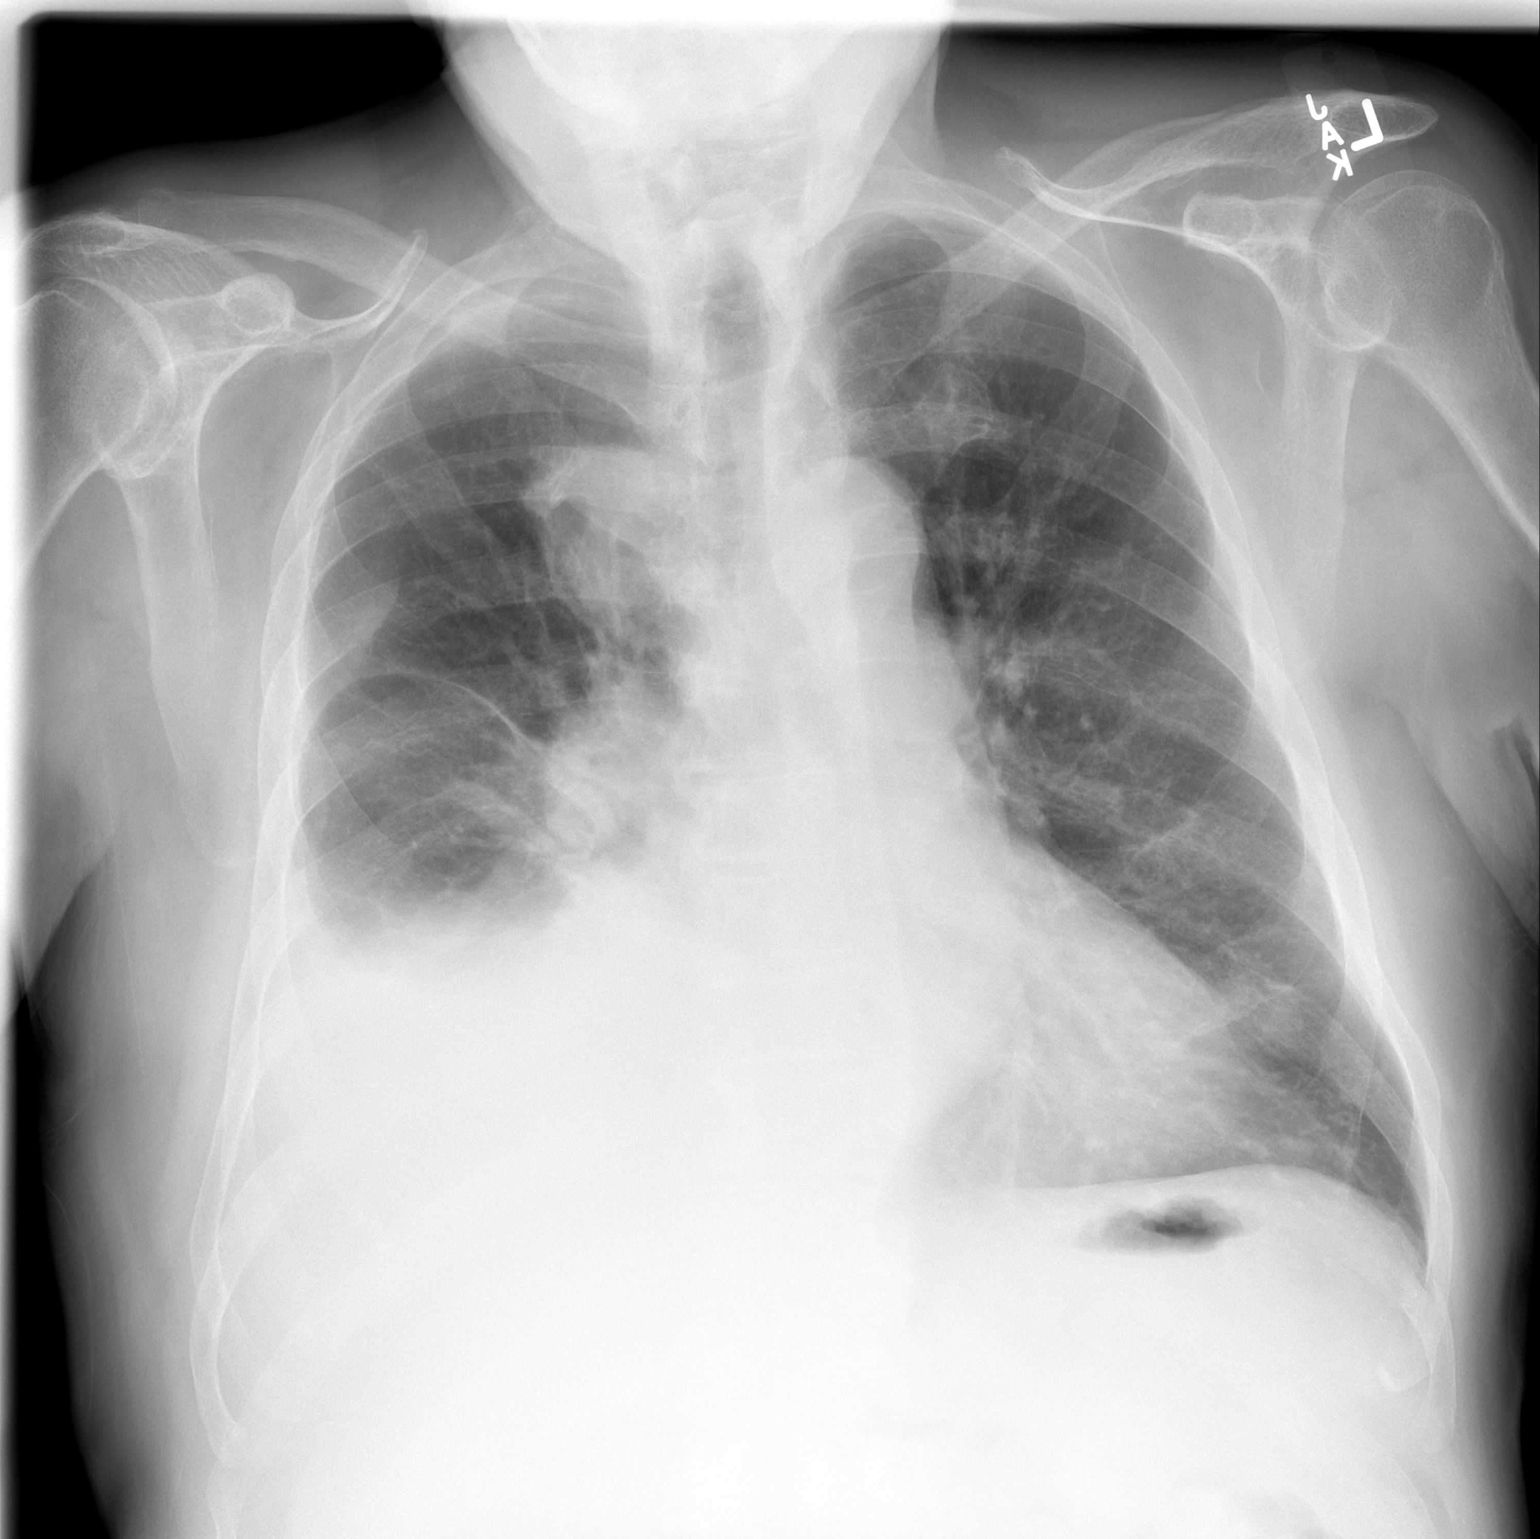

[w chest lat]
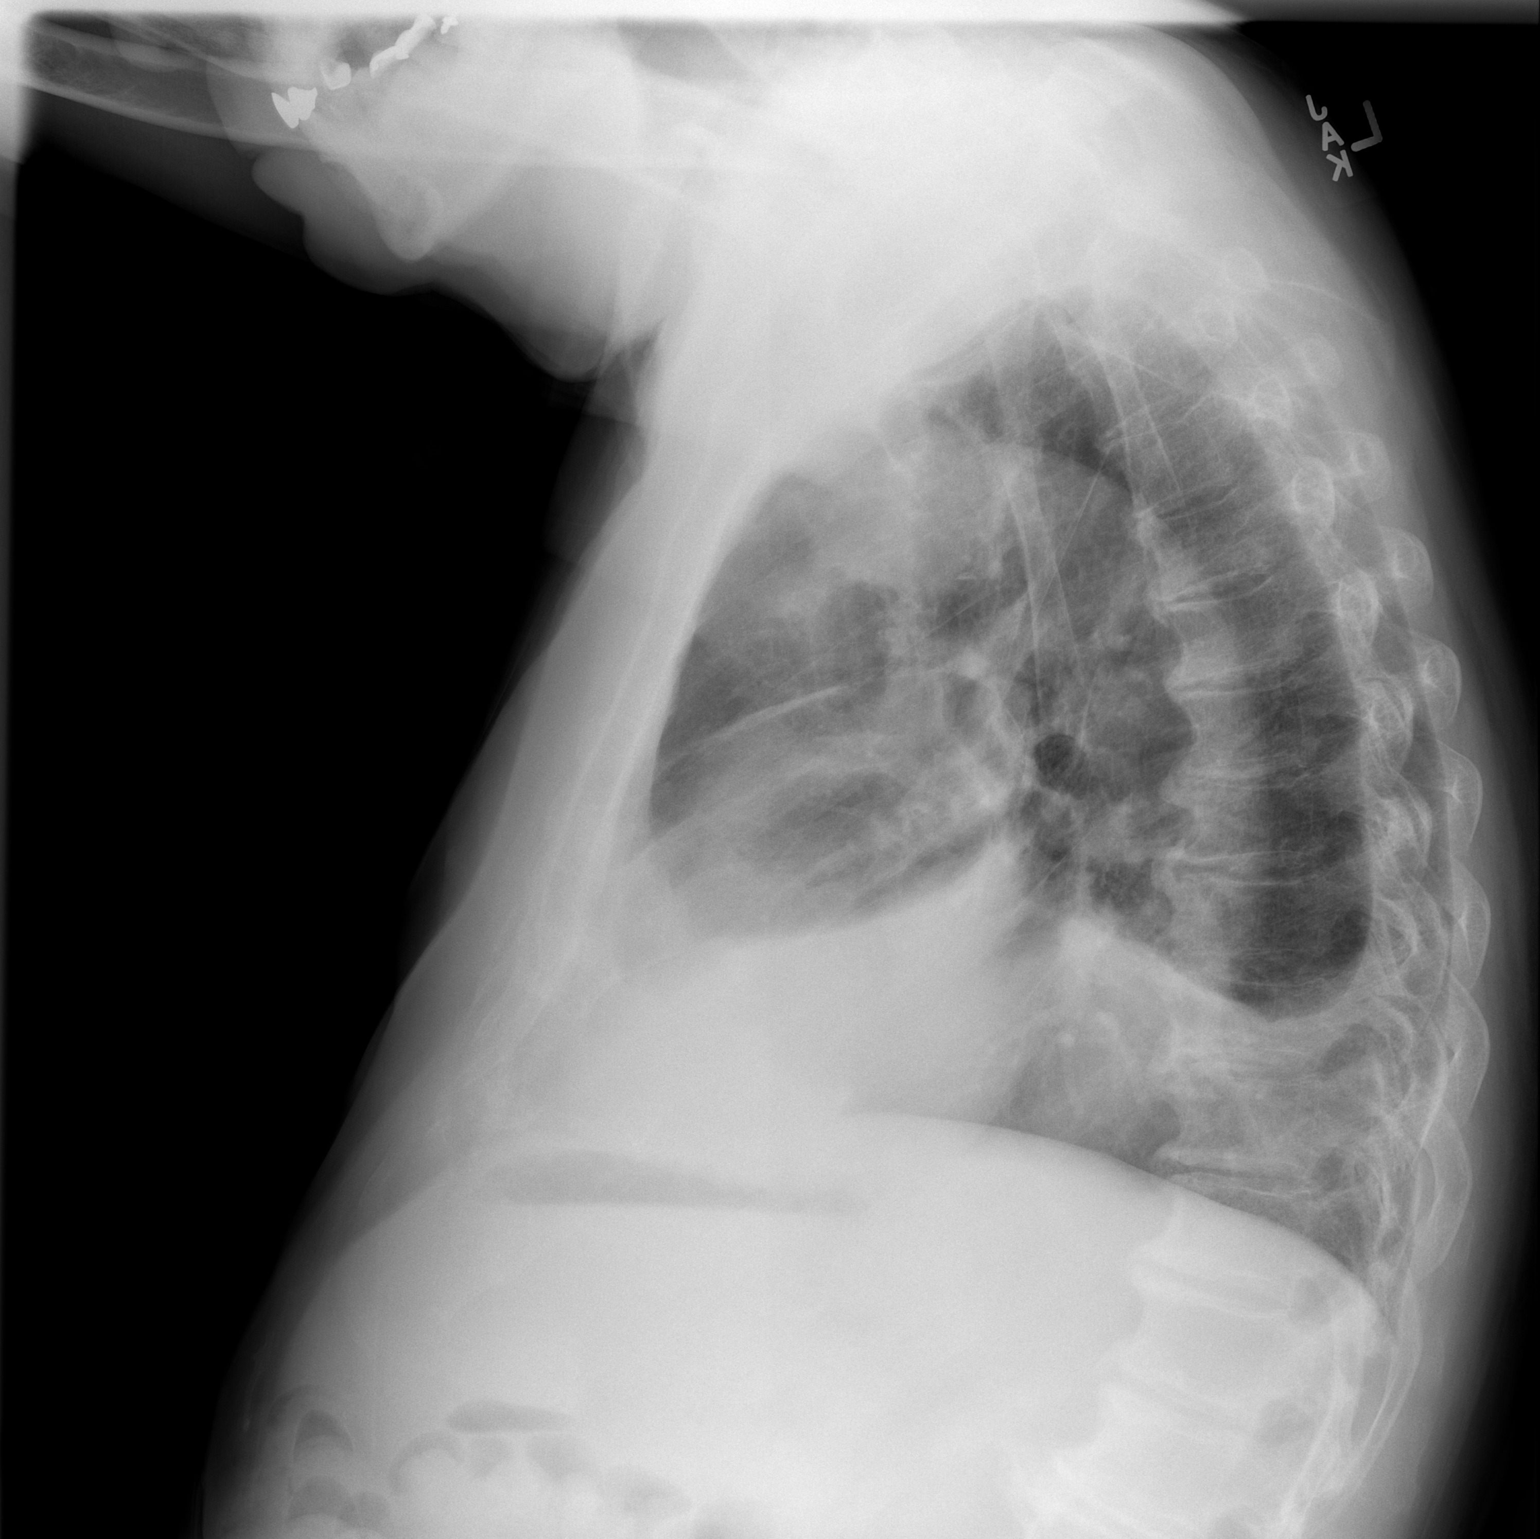

[2 of 2 positions shown; findings below may reference images not displayed]

FINDINGS: Persistent upper lobe mass with right hilar fullness is noted.
Recurrent prominent right pleural effusion. Left lung is stable.
Heart size is stable. Interval resolution of right pneumothorax.
IMPRESSION: 1. Recurrent right pleural effusion, moderate-sized. Interval
resolution of right pneumothorax.
2. New right upper lobe mass and right hilar fullness is stable.

## 2016-09-19 ENCOUNTER — Other Ambulatory Visit: Payer: Self-pay | Admitting: Nurse Practitioner

## 2016-11-19 IMAGING — CT CT CHEST W/ CM
2 of 7 series · 14 of 46 positions shown, 18 images · IV contrast (OMNIPAQUE)
Comparison: PET-CT 02/16/2015

CLINICAL DATA: Lung carcinoma diagnosed 7772. Chemotherapy ongoing.
Personal history of prostate cancer and colorectal carcinoma.

EXAM:
CT CHEST, ABDOMEN, AND PELVIS WITH CONTRAST
TECHNIQUE: Multidetector CT imaging of the chest, abdomen and pelvis was
performed following the standard protocol during bolus
administration of intravenous contrast.
CONTRAST:  100mL OMNIPAQUE IOHEXOL 300 MG/ML  SOLN

[Series 2: cap with st · axial · 0.84mm/px · z∈[-606,-16]mm · 11 of 134 slices shown, 15 images]
[im 8/134  soft-tissue]
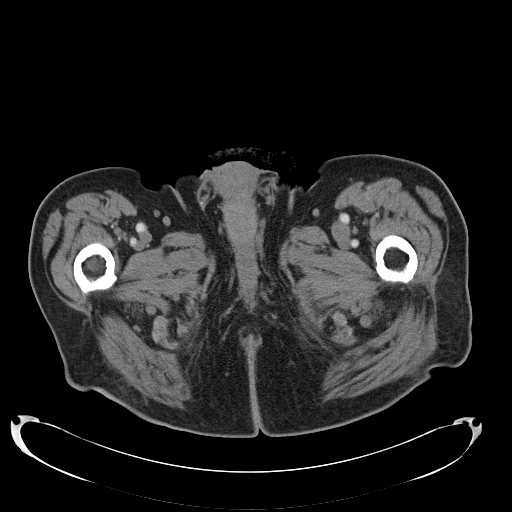
[im 8/134  bone]
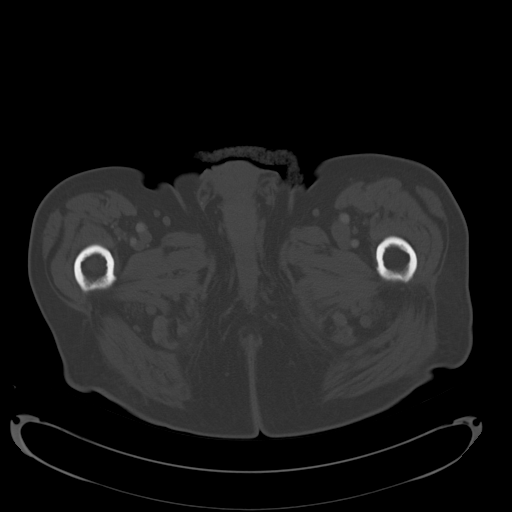
[im 23/134  soft-tissue]
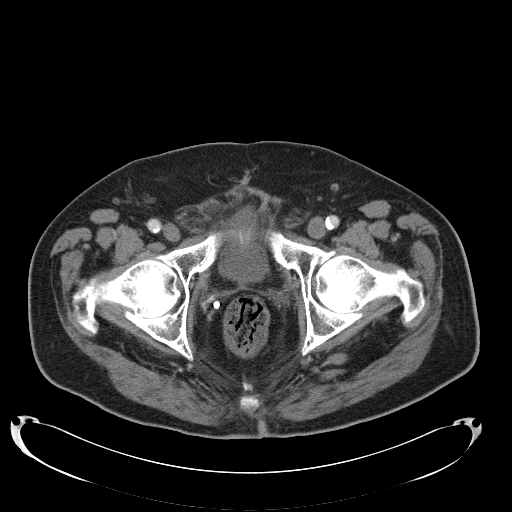
[im 37/134  soft-tissue]
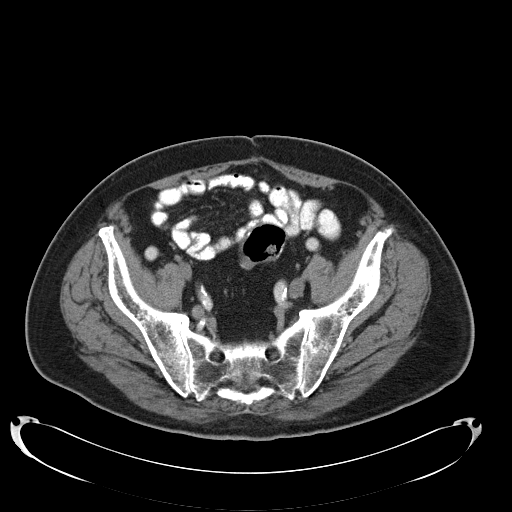
[im 52/134  soft-tissue]
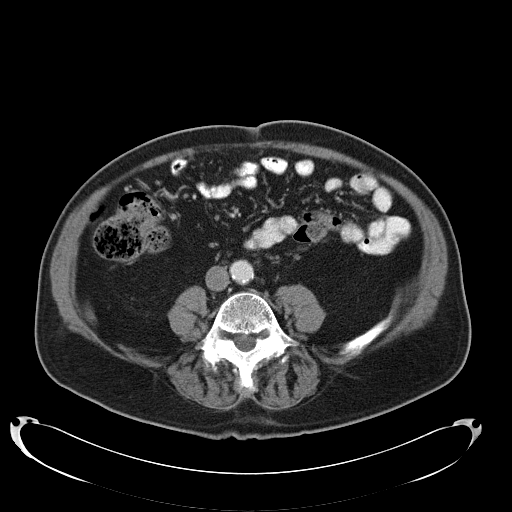
[im 67/134  soft-tissue]
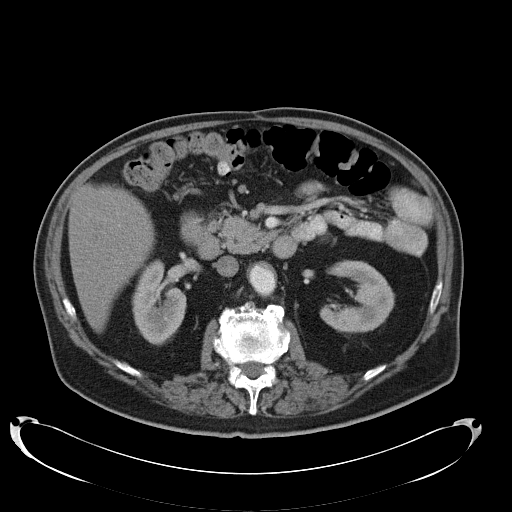
[im 82/134  soft-tissue]
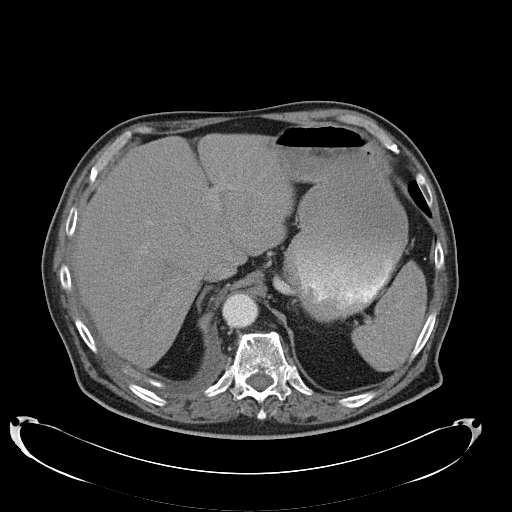
[im 97/134  soft-tissue]
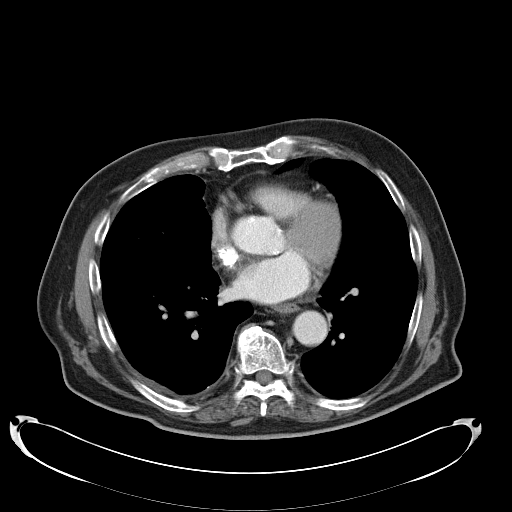
[im 104/134  lung]
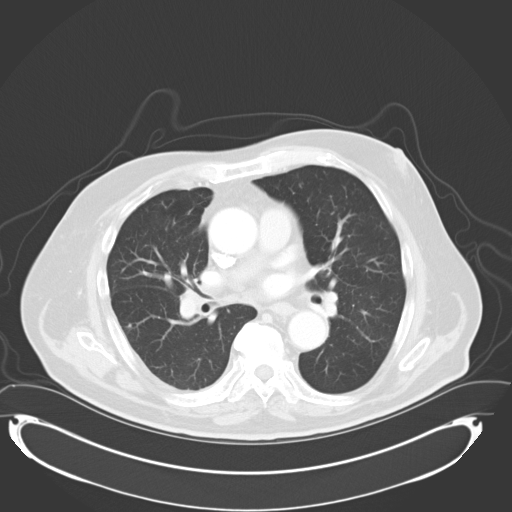
[im 111/134  soft-tissue]
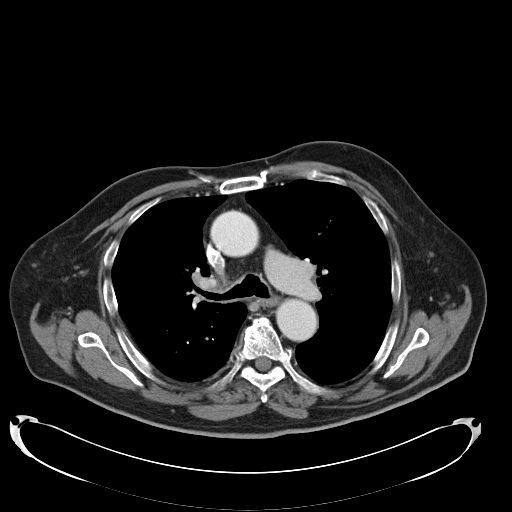
[im 111/134  lung]
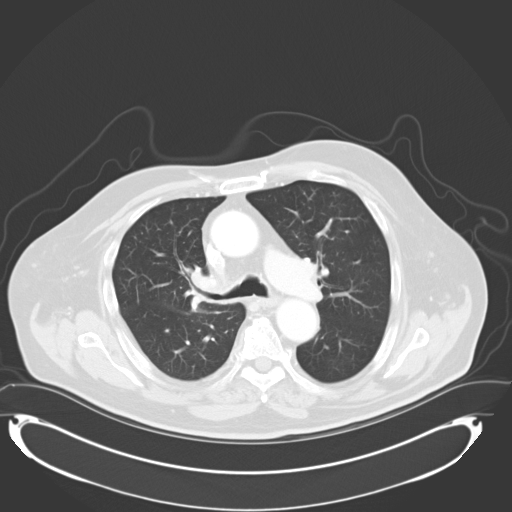
[im 119/134  lung]
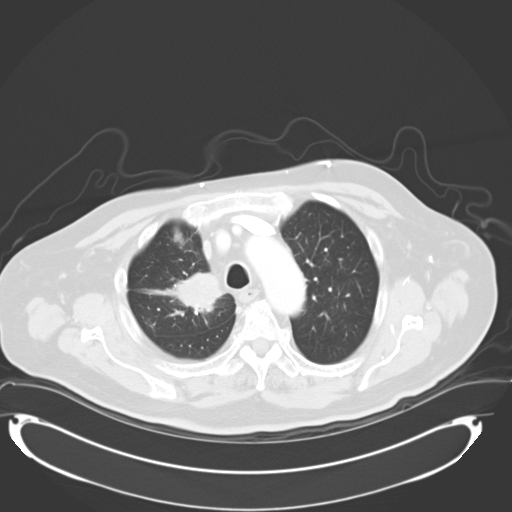
[im 126/134  soft-tissue]
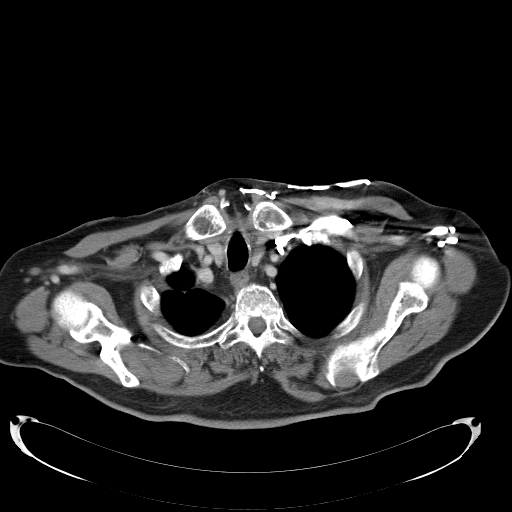
[im 126/134  lung]
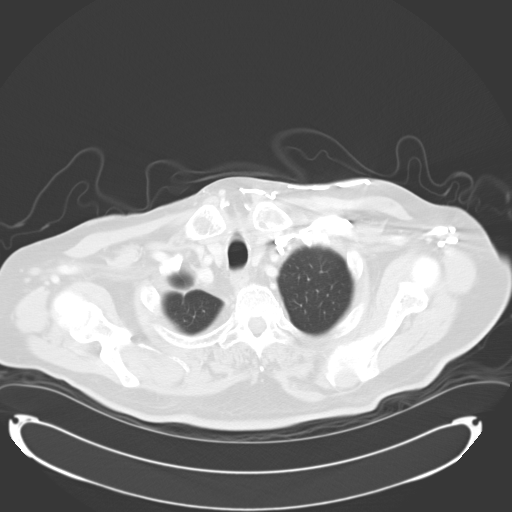
[im 126/134  bone]
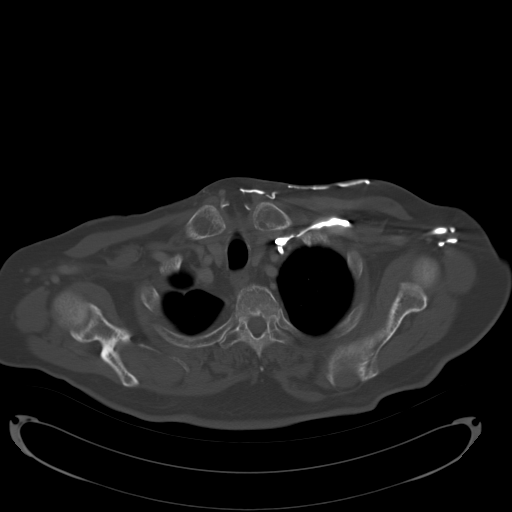

[Series 602: <mpr thick range> · coronal · 1.31mm/px · 3 of 97 slices shown]
[im 25/97  soft-tissue]
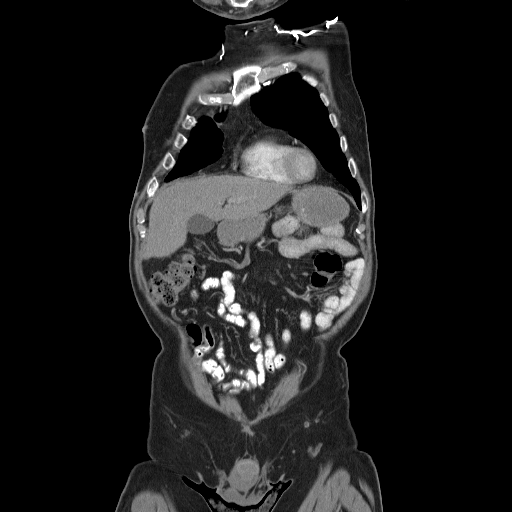
[im 49/97  soft-tissue]
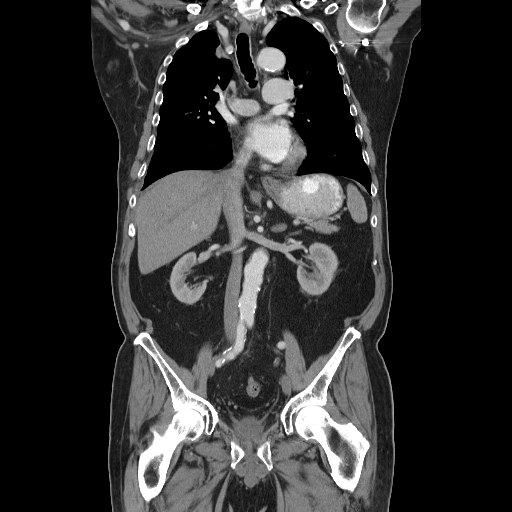
[im 73/97  soft-tissue]
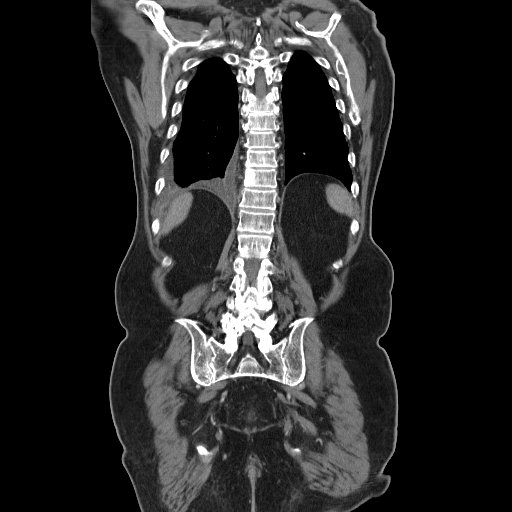

[14 of 46 positions shown; findings below may reference images not displayed]

FINDINGS: CT CHEST FINDINGS

Mediastinum/Nodes: No axillary or supraclavicular adenopathy.

Interval reduction in size of mediastinal lymph nodes. For example
subcarinal lymph node measures 8 mm decreased from 21 mm. RIGHT
hilar lymph node measures 2.5 cm decreased from 3.0 cm.

Lungs/Pleura:

RIGHT upper lobe suprahilar mass measures 3.6 x 4.3 cm (image 15,
series 2) decreased from 5.4 x 4.3 cm on comparison CT scan.
Interval reduction in pleural fluid in the RIGHT hemi thorax.

Nodule at the LEFT lung base measures 10 mm decreased from 13 mm. No
new pulmonary nodules.

Musculoskeletal: Sclerotic lesions at T9 and T10 are unchanged.

CT ABDOMEN PELVIS FINDINGS

Hepatobiliary: No focal hepatic lesion.  Normal gallbladder.

Pancreas: Normal pancreatic parenchyma.

Spleen: The spleen.

Adrenals/Urinary Tract: Thickening of the LEFT adrenal gland to 18
mm not changed. Kidneys, ureters and bladder normal.

Stomach/Bowel: Stomach, small bowel, appendix, and cecum are normal.
There is anastomosis the proximal sigmoid colon without obstruction
or nodularity. Rectum normal.

Vascular/Lymphatic: Abdominal aorta is normal caliber. There is no
retroperitoneal or periportal lymphadenopathy. No pelvic
lymphadenopathy.

Reproductive: Post prostatectomy.

Musculoskeletal: Stable sclerotic lesions at T9, T10 and L5.
IMPRESSION: Chest Impression:

1. Interval decrease in size of RIGHT upper lobe suprahilar mass.
2. Interval decrease size of mediastinal lymph nodes.
3. Interval decrease in size of LEFT lower lobe pulmonary nodule.
4. Interval decrease in volume of RIGHT pleural effusion.

Abdomen / Pelvis Impression:

1. Stable thickening of the LEFT adrenal gland which was
hypermetabolic on comparison PET-CT scan.
2. No disease progression the abdomen or pelvis.
3. Stable sclerotic lesions in the lower thoracic spine and lumbar
spine. Lesion at T10 and iliac lesions were hypermetabolic on
comparison PET-CT scan.
